# Patient Record
Sex: Male | Born: 1945
Health system: Southern US, Community
[De-identification: ages and names within clinical notes are randomized; demographics above are authoritative.]

## PROBLEM LIST (undated history)

## (undated) DIAGNOSIS — C61 Malignant neoplasm of prostate: Secondary | ICD-10-CM

## (undated) DIAGNOSIS — D649 Anemia, unspecified: Secondary | ICD-10-CM

## (undated) DIAGNOSIS — M4800 Spinal stenosis, site unspecified: Secondary | ICD-10-CM

## (undated) DIAGNOSIS — E785 Hyperlipidemia, unspecified: Secondary | ICD-10-CM

## (undated) DIAGNOSIS — H269 Unspecified cataract: Secondary | ICD-10-CM

## (undated) DIAGNOSIS — M1990 Unspecified osteoarthritis, unspecified site: Secondary | ICD-10-CM

## (undated) DIAGNOSIS — I10 Essential (primary) hypertension: Secondary | ICD-10-CM

## (undated) DIAGNOSIS — T7840XA Allergy, unspecified, initial encounter: Secondary | ICD-10-CM

## (undated) DIAGNOSIS — Z9289 Personal history of other medical treatment: Secondary | ICD-10-CM

## (undated) DIAGNOSIS — G4730 Sleep apnea, unspecified: Secondary | ICD-10-CM

## (undated) DIAGNOSIS — R7303 Prediabetes: Secondary | ICD-10-CM

## (undated) DIAGNOSIS — H9190 Unspecified hearing loss, unspecified ear: Secondary | ICD-10-CM

## (undated) HISTORY — DX: Unspecified cataract: H26.9

## (undated) HISTORY — DX: Prediabetes: R73.03

## (undated) HISTORY — DX: Hyperlipidemia, unspecified: E78.5

## (undated) HISTORY — PX: POLYPECTOMY: SHX149

## (undated) HISTORY — DX: Personal history of other medical treatment: Z92.89

## (undated) HISTORY — DX: Allergy, unspecified, initial encounter: T78.40XA

## (undated) HISTORY — PX: TONSILLECTOMY: SUR1361

## (undated) HISTORY — DX: Essential (primary) hypertension: I10

## (undated) HISTORY — DX: Sleep apnea, unspecified: G47.30

## (undated) HISTORY — PX: VASECTOMY: SHX75

## (undated) HISTORY — DX: Unspecified osteoarthritis, unspecified site: M19.90

## (undated) HISTORY — DX: Unspecified hearing loss, unspecified ear: H91.90

## (undated) HISTORY — DX: Malignant neoplasm of prostate: C61

## (undated) HISTORY — DX: Spinal stenosis, site unspecified: M48.00

## (undated) HISTORY — DX: Anemia, unspecified: D64.9

---

## 1994-11-06 DIAGNOSIS — Z9289 Personal history of other medical treatment: Secondary | ICD-10-CM

## 1994-11-06 DIAGNOSIS — D649 Anemia, unspecified: Secondary | ICD-10-CM

## 1994-11-06 HISTORY — DX: Personal history of other medical treatment: Z92.89

## 1994-11-06 HISTORY — DX: Anemia, unspecified: D64.9

## 1998-05-31 ENCOUNTER — Inpatient Hospital Stay (HOSPITAL_COMMUNITY): Admission: EM | Admit: 1998-05-31 | Discharge: 1998-06-01 | Payer: Self-pay | Admitting: Emergency Medicine

## 2000-05-08 ENCOUNTER — Ambulatory Visit (HOSPITAL_BASED_OUTPATIENT_CLINIC_OR_DEPARTMENT_OTHER): Admission: RE | Admit: 2000-05-08 | Discharge: 2000-05-08 | Payer: Self-pay | Admitting: General Surgery

## 2004-01-23 ENCOUNTER — Ambulatory Visit (HOSPITAL_BASED_OUTPATIENT_CLINIC_OR_DEPARTMENT_OTHER): Admission: RE | Admit: 2004-01-23 | Discharge: 2004-01-23 | Payer: Self-pay | Admitting: Internal Medicine

## 2004-12-22 ENCOUNTER — Ambulatory Visit: Payer: Self-pay | Admitting: Internal Medicine

## 2004-12-29 ENCOUNTER — Ambulatory Visit: Payer: Self-pay | Admitting: Internal Medicine

## 2005-04-13 ENCOUNTER — Ambulatory Visit: Payer: Self-pay | Admitting: Internal Medicine

## 2005-06-22 ENCOUNTER — Ambulatory Visit: Payer: Self-pay | Admitting: Internal Medicine

## 2005-12-28 ENCOUNTER — Ambulatory Visit: Payer: Self-pay | Admitting: Internal Medicine

## 2006-01-04 ENCOUNTER — Ambulatory Visit: Payer: Self-pay | Admitting: Internal Medicine

## 2006-07-06 ENCOUNTER — Ambulatory Visit: Payer: Self-pay | Admitting: Internal Medicine

## 2006-07-13 ENCOUNTER — Ambulatory Visit: Payer: Self-pay | Admitting: Internal Medicine

## 2007-01-31 ENCOUNTER — Ambulatory Visit: Payer: Self-pay | Admitting: Internal Medicine

## 2007-01-31 LAB — CONVERTED CEMR LAB
AST: 34 units/L (ref 0–37)
Bilirubin, Direct: 0.1 mg/dL (ref 0.0–0.3)
Cholesterol: 156 mg/dL (ref 0–200)
Eosinophils Absolute: 0.3 10*3/uL (ref 0.0–0.6)
Eosinophils Relative: 4.6 % (ref 0.0–5.0)
GFR calc Af Amer: 126 mL/min
GFR calc non Af Amer: 104 mL/min
Glucose, Bld: 113 mg/dL — ABNORMAL HIGH (ref 70–99)
HCT: 44.9 % (ref 39.0–52.0)
Hgb A1c MFr Bld: 6.3 % — ABNORMAL HIGH (ref 4.6–6.0)
Lymphocytes Relative: 34.7 % (ref 12.0–46.0)
MCV: 91.7 fL (ref 78.0–100.0)
Monocytes Absolute: 0.7 10*3/uL (ref 0.2–0.7)
Neutro Abs: 2.6 10*3/uL (ref 1.4–7.7)
Neutrophils Relative %: 47.6 % (ref 43.0–77.0)
Potassium: 4.1 meq/L (ref 3.5–5.1)
Sodium: 145 meq/L (ref 135–145)
TSH: 1.46 microintl units/mL (ref 0.35–5.50)
Triglycerides: 62 mg/dL (ref 0–149)
WBC: 5.6 10*3/uL (ref 4.5–10.5)

## 2007-02-08 ENCOUNTER — Ambulatory Visit: Payer: Self-pay | Admitting: Internal Medicine

## 2007-04-05 ENCOUNTER — Ambulatory Visit: Payer: Self-pay | Admitting: Internal Medicine

## 2007-04-05 LAB — CONVERTED CEMR LAB
ALT: 22 units/L (ref 0–40)
Alkaline Phosphatase: 45 units/L (ref 39–117)
Cholesterol: 176 mg/dL (ref 0–200)
LDL Cholesterol: 104 mg/dL — ABNORMAL HIGH (ref 0–99)
Total Protein: 6.7 g/dL (ref 6.0–8.3)

## 2007-04-12 ENCOUNTER — Ambulatory Visit: Payer: Self-pay | Admitting: Internal Medicine

## 2007-08-09 ENCOUNTER — Ambulatory Visit: Payer: Self-pay | Admitting: Internal Medicine

## 2007-08-09 LAB — CONVERTED CEMR LAB
AST: 33 units/L (ref 0–37)
Albumin: 4 g/dL (ref 3.5–5.2)
Triglycerides: 36 mg/dL (ref 0–149)
VLDL: 7 mg/dL (ref 0–40)

## 2007-08-23 ENCOUNTER — Ambulatory Visit: Payer: Self-pay | Admitting: Internal Medicine

## 2007-08-23 DIAGNOSIS — E785 Hyperlipidemia, unspecified: Secondary | ICD-10-CM | POA: Insufficient documentation

## 2007-08-23 LAB — CONVERTED CEMR LAB
Cholesterol, target level: 200 mg/dL
HDL goal, serum: 40 mg/dL
LDL Goal: 160 mg/dL

## 2007-11-19 ENCOUNTER — Encounter (INDEPENDENT_AMBULATORY_CARE_PROVIDER_SITE_OTHER): Payer: Self-pay | Admitting: Family Medicine

## 2007-12-20 ENCOUNTER — Ambulatory Visit: Payer: Self-pay | Admitting: Internal Medicine

## 2007-12-20 LAB — CONVERTED CEMR LAB
ALT: 24 units/L (ref 0–53)
AST: 33 units/L (ref 0–37)
Bilirubin, Direct: 0.2 mg/dL (ref 0.0–0.3)
Cholesterol: 148 mg/dL (ref 0–200)
HDL: 65.4 mg/dL (ref >39.0)
LDL Cholesterol: 76 mg/dL (ref 0–99)
Total Bilirubin: 1.1 mg/dL (ref 0.3–1.2)
Total Protein: 6.6 g/dL (ref 6.0–8.3)

## 2007-12-27 ENCOUNTER — Ambulatory Visit: Payer: Self-pay | Admitting: Internal Medicine

## 2008-06-26 ENCOUNTER — Ambulatory Visit: Payer: Self-pay | Admitting: Internal Medicine

## 2008-06-26 LAB — CONVERTED CEMR LAB
ALT: 19 units/L (ref 0–53)
Albumin: 4 g/dL (ref 3.5–5.2)
Alkaline Phosphatase: 44 units/L (ref 39–117)
BUN: 17 mg/dL (ref 6–23)
Bilirubin, Direct: 0.1 mg/dL (ref 0.0–0.3)
CO2: 32 meq/L (ref 19–32)
Eosinophils Relative: 3.3 % (ref 0.0–5.0)
GFR calc Af Amer: 110 mL/min
Glucose, Bld: 102 mg/dL — ABNORMAL HIGH (ref 70–99)
HCT: 41.4 % (ref 39.0–52.0)
Hemoglobin: 14.3 g/dL (ref 13.0–17.0)
LDL Cholesterol: 77 mg/dL (ref 0–99)
Lymphocytes Relative: 37.4 % (ref 12.0–46.0)
Monocytes Absolute: 0.5 10*3/uL (ref 0.1–1.0)
Monocytes Relative: 9.8 % (ref 3.0–12.0)
Neutro Abs: 2.2 10*3/uL (ref 1.4–7.7)
Nitrite: NEGATIVE
Platelets: 195 10*3/uL (ref 150–400)
Potassium: 4.3 meq/L (ref 3.5–5.1)
Specific Gravity, Urine: 1.015
Total CHOL/HDL Ratio: 2.3
Total Protein: 6.8 g/dL (ref 6.0–8.3)
WBC Urine, dipstick: NEGATIVE
WBC: 4.7 10*3/uL (ref 4.5–10.5)

## 2008-07-03 ENCOUNTER — Ambulatory Visit: Payer: Self-pay | Admitting: Internal Medicine

## 2008-07-27 ENCOUNTER — Telehealth: Payer: Self-pay | Admitting: Internal Medicine

## 2008-07-28 ENCOUNTER — Ambulatory Visit: Payer: Self-pay | Admitting: Internal Medicine

## 2008-08-11 ENCOUNTER — Encounter: Payer: Self-pay | Admitting: Internal Medicine

## 2008-08-11 ENCOUNTER — Ambulatory Visit: Payer: Self-pay | Admitting: Internal Medicine

## 2008-08-11 LAB — HM COLONOSCOPY

## 2008-08-12 ENCOUNTER — Encounter: Payer: Self-pay | Admitting: Internal Medicine

## 2009-01-08 ENCOUNTER — Ambulatory Visit: Payer: Self-pay | Admitting: Internal Medicine

## 2009-01-08 LAB — CONVERTED CEMR LAB
Alkaline Phosphatase: 60 units/L (ref 39–117)
Bilirubin, Direct: 0.1 mg/dL (ref 0.0–0.3)
Cholesterol: 167 mg/dL (ref 0–200)
LDL Cholesterol: 89 mg/dL (ref 0–99)
Total Bilirubin: 1.2 mg/dL (ref 0.3–1.2)
Total CHOL/HDL Ratio: 2.3
Total Protein: 7.1 g/dL (ref 6.0–8.3)
VLDL: 7 mg/dL (ref 0–40)

## 2009-01-15 ENCOUNTER — Ambulatory Visit: Payer: Self-pay | Admitting: Internal Medicine

## 2009-02-01 ENCOUNTER — Telehealth: Payer: Self-pay | Admitting: *Deleted

## 2009-07-09 ENCOUNTER — Ambulatory Visit: Payer: Self-pay | Admitting: Internal Medicine

## 2009-07-09 LAB — CONVERTED CEMR LAB
Alkaline Phosphatase: 55 units/L (ref 39–117)
BUN: 18 mg/dL (ref 6–23)
Basophils Relative: 0.2 % (ref 0.0–3.0)
Bilirubin Urine: NEGATIVE
Bilirubin, Direct: 0.1 mg/dL (ref 0.0–0.3)
Blood in Urine, dipstick: NEGATIVE
Calcium: 9.1 mg/dL (ref 8.4–10.5)
Chloride: 105 meq/L (ref 96–112)
Cholesterol: 159 mg/dL (ref 0–200)
Creatinine, Ser: 0.8 mg/dL (ref 0.4–1.5)
Eosinophils Absolute: 0.3 10*3/uL (ref 0.0–0.7)
Eosinophils Relative: 5.9 % — ABNORMAL HIGH (ref 0.0–5.0)
HDL: 72.5 mg/dL (ref >39.00)
Ketones, urine, test strip: NEGATIVE
LDL Cholesterol: 79 mg/dL (ref 0–99)
Lymphocytes Relative: 33.7 % (ref 12.0–46.0)
MCV: 92.8 fL (ref 78.0–100.0)
Monocytes Absolute: 0.5 10*3/uL (ref 0.1–1.0)
Neutrophils Relative %: 49.5 % (ref 43.0–77.0)
Nitrite: NEGATIVE
PSA: 1.39 ng/mL (ref 0.10–4.00)
Platelets: 175 10*3/uL (ref 150.0–400.0)
Protein, U semiquant: NEGATIVE
RBC: 4.65 M/uL (ref 4.22–5.81)
Total Bilirubin: 1.4 mg/dL — ABNORMAL HIGH (ref 0.3–1.2)
Total CHOL/HDL Ratio: 2
Triglycerides: 38 mg/dL (ref 0.0–149.0)
Urobilinogen, UA: 0.2
VLDL: 7.6 mg/dL (ref 0.0–40.0)
WBC: 4.9 10*3/uL (ref 4.5–10.5)

## 2009-07-23 ENCOUNTER — Ambulatory Visit: Payer: Self-pay | Admitting: Internal Medicine

## 2009-09-17 ENCOUNTER — Telehealth: Payer: Self-pay | Admitting: *Deleted

## 2009-12-24 ENCOUNTER — Ambulatory Visit: Payer: Self-pay | Admitting: Internal Medicine

## 2009-12-24 LAB — CONVERTED CEMR LAB
ALT: 21 units/L (ref 0–53)
AST: 30 units/L (ref 0–37)
Albumin: 4 g/dL (ref 3.5–5.2)
Alkaline Phosphatase: 50 units/L (ref 39–117)
Bilirubin, Direct: 0.1 mg/dL (ref 0.0–0.3)
Cholesterol: 139 mg/dL (ref 0–200)
HDL: 64.8 mg/dL (ref >39.00)
LDL Cholesterol: 63 mg/dL (ref 0–99)
Total Bilirubin: 1 mg/dL (ref 0.3–1.2)
Total CHOL/HDL Ratio: 2
Total Protein: 7 g/dL (ref 6.0–8.3)
Triglycerides: 57 mg/dL (ref 0.0–149.0)
VLDL: 11.4 mg/dL (ref 0.0–40.0)

## 2009-12-31 ENCOUNTER — Ambulatory Visit: Payer: Self-pay | Admitting: Internal Medicine

## 2010-01-25 ENCOUNTER — Telehealth: Payer: Self-pay | Admitting: Internal Medicine

## 2010-04-12 ENCOUNTER — Encounter: Payer: Self-pay | Admitting: Internal Medicine

## 2010-07-25 ENCOUNTER — Ambulatory Visit: Payer: Self-pay | Admitting: Internal Medicine

## 2010-07-25 LAB — CONVERTED CEMR LAB
BUN: 15 mg/dL (ref 6–23)
Basophils Absolute: 0.1 10*3/uL (ref 0.0–0.1)
Basophils Relative: 1 % (ref 0.0–3.0)
Bilirubin, Direct: 0.1 mg/dL (ref 0.0–0.3)
CO2: 31 meq/L (ref 19–32)
Chloride: 103 meq/L (ref 96–112)
Cholesterol: 149 mg/dL (ref 0–200)
Creatinine, Ser: 0.8 mg/dL (ref 0.4–1.5)
Eosinophils Absolute: 0.3 10*3/uL (ref 0.0–0.7)
Glucose, Bld: 91 mg/dL (ref 70–99)
HDL: 60.5 mg/dL (ref >39.00)
MCHC: 33.9 g/dL (ref 30.0–36.0)
MCV: 92 fL (ref 78.0–100.0)
Monocytes Absolute: 0.6 10*3/uL (ref 0.1–1.0)
Neutrophils Relative %: 53.8 % (ref 43.0–77.0)
PSA: 1.68 ng/mL (ref 0.10–4.00)
Platelets: 192 10*3/uL (ref 150.0–400.0)
RBC: 4.69 M/uL (ref 4.22–5.81)
RDW: 14.6 % (ref 11.5–14.6)
Total Protein: 6.6 g/dL (ref 6.0–8.3)
Triglycerides: 57 mg/dL (ref 0.0–149.0)

## 2010-09-09 ENCOUNTER — Telehealth: Payer: Self-pay | Admitting: Internal Medicine

## 2010-12-08 NOTE — Assessment & Plan Note (Signed)
Summary: emp--will fast//ccm   Vital Signs:  Patient profile:   65 year old male Height:      72 inches Weight:      194 pounds BMI:     26.41 Temp:     98.2 degrees F oral Pulse rate:   68 / minute Resp:     14 per minute BP sitting:   122 / 80  (left arm)  Vitals Entered By: Willy Eddy, LPN (July 25, 2010 8:56 AM)  Nutrition Counseling: Patient's BMI is greater than 25 and therefore counseled on weight management options. CC: annualvisit for disease managment, Lipid Management Is Patient Diabetic? No Pain Assessment Patient in pain? no       Does patient need assistance? Functional Status Self care Ambulation Normal  Vision Screening:Left eye with correction: 20 / 20 Right eye with correction: 20 / 20       40db HL: Left  Right  Audiometry Comment: can hear whispered voice at 6 feet    CC:  annualvisit for disease managment and Lipid Management.  History of Present Illness: The pt was asked about all immunizations, health maint. services that are appropriate to their age and was given guidance on diet exercize  and weight management   Lipid Management History:      Positive NCEP/ATP III risk factors include male age 55 years old or older.  Negative NCEP/ATP III risk factors include non-diabetic, HDL cholesterol greater than 60, no family history for ischemic heart disease, non-tobacco-user status, non-hypertensive, no ASHD (atherosclerotic heart disease), no prior stroke/TIA, no peripheral vascular disease, and no history of aortic aneurysm.     Preventive Screening-Counseling & Management  Alcohol-Tobacco     Alcohol drinks/day: 1     Smoking Status: quit     Year Quit: 1987     Passive Smoke Exposure: no     Tobacco Counseling: to remain off tobacco products  Problems Prior to Update: 1)  Physical Examination  (ICD-V70.0) 2)  Family History Diabetes 1st Degree Relative  (ICD-V18.0) 3)  Hyperlipidemia  (ICD-272.4)  Current Problems  (verified): 1)  Physical Examination  (ICD-V70.0) 2)  Family History Diabetes 1st Degree Relative  (ICD-V18.0) 3)  Hyperlipidemia  (ICD-272.4)  Medications Prior to Update: 1)  Lipitor 40 Mg  Tabs (Atorvastatin Calcium) .... 1/2 Tab By Mouth Daily 2)  Niacin Flush Free 500 Mg  Caps (Inositol Niacinate) .... Once Daily 3)  Fish Oil 1200 Mg  Caps (Omega-3 Fatty Acids) .... Qd 4)  Mvi .... Once Daily 5)  B-12 1000 Mcg Cr-Tabs (Cyanocobalamin) .[name].. 1 Once Daily 6)  Vitamin D 1000 Unit Caps (Cholecalciferol) .[name].. 1 Once Daily 7)  Cvs Vitamin E 400 Unit Caps (Vitamin E) .[name].. 1 Two Times A Day 8)  Osteo Bi-Flex Joint Shield  Tabs (Misc Natural Products) .... 2 Once Daily 9)  Vitamin C Cr 500 Mg Cr-Caps (Ascorbic Acid) .[name].. 1 Once Daily 10)  Viagra 50 Mg Tabs (Sildenafil Citrate) .... One By Mouth Prn 11)  Promethazine Hcl 25 Mg Tabs (Promethazine Hcl) .... Take One Tab Every 6 Hours  Current Medications (verified): 1)  Lipitor 20 Mg Tabs (Atorvastatin Calcium) .[name].. 1 Once Daily 2)  Niacin Flush Free 500 Mg  Caps (Inositol Niacinate) .... Once Daily 3)  Fish Oil 1200 Mg  Caps (Omega-3 Fatty Acids) .... Qd 4)  Mvi .... Once Daily 5)  B-12 1000 Mcg Cr-Tabs (Cyanocobalamin) .[name].. 1 Once Daily 6)  Vitamin D 1000 Unit Caps (Cholecalciferol) .Marland KitchenMarland KitchenMarland Kitchen  1 Once Daily 7)  Cvs Vitamin E 400 Unit Caps (Vitamin E) .[name].. 1 Two Times A Day 8)  Osteo Bi-Flex Joint Shield  Tabs (Misc Natural Products) .... 2 Once Daily 9)  Vitamin C Cr 500 Mg Cr-Caps (Ascorbic Acid) .[name].. 1 Once Daily-During Winter 10)  Viagra 50 Mg Tabs (Sildenafil Citrate) .... One By Mouth Prn  Allergies (verified): No Known Drug Allergies  Past History:  Family History: Last updated: 08/23/2007 Family History of Prostate CA 1st degree relative <50 renal dz Family History Diabetes 1st degree relative  Social History: Last updated: 07/03/2008 Retired  layed off Married Former Smoker  Risk Factors: Alcohol Use: 1  (07/25/2010) Caffeine Use: 2 (08/23/2007) Exercise: yes (08/23/2007)  Risk Factors: Smoking Status: quit (07/25/2010) Passive Smoke Exposure: no (07/25/2010)  Past medical, surgical, family and social histories (including risk factors) reviewed, and no changes noted (except as noted below).  Past Medical History: Reviewed history from 08/23/2007 and no changes required. Hyperlipidemia  Past Surgical History: Reviewed history from 08/23/2007 and no changes required. Tonsillectomy Vasectomy  Family History: Reviewed history from 08/23/2007 and no changes required. Family History of Prostate CA 1st degree relative <50 renal dz Family History Diabetes 1st degree relative  Social History: Reviewed history from 07/03/2008 and no changes required. Retired  layed off Married Former Smoker  Review of Systems  The patient denies anorexia, fever, weight loss, weight gain, vision loss, decreased hearing, hoarseness, chest pain, syncope, dyspnea on exertion, peripheral edema, prolonged cough, headaches, hemoptysis, abdominal pain, melena, hematochezia, severe indigestion/heartburn, hematuria, incontinence, genital sores, muscle weakness, suspicious skin lesions, transient blindness, difficulty walking, depression, unusual weight change, abnormal bleeding, enlarged lymph nodes, angioedema, breast masses, and testicular masses.         Flu Vaccine Consent Questions     Do you have a history of severe allergic reactions to this vaccine? no    Any prior history of allergic reactions to egg and/or gelatin? no    Do you have a sensitivity to the preservative Thimersol? no    Do you have a past history of Guillan-Barre Syndrome? no    Do you currently have an acute febrile illness? no    Have you ever had a severe reaction to latex? no    Vaccine information given and explained to patient? yes    Are you currently pregnant? no    Lot Number:[id number]   Exp Date:05/06/2011   Site Given  Left  Deltoid IM    Physical Exam  General:  Well-developed,well-nourished,in no acute distress; alert,appropriate and cooperative throughout examination Head:  male-pattern balding.  normocephalic.   Eyes:  pupils equal and pupils round.   Ears:  R ear normal and L ear normal.   Nose:  no external deformity and no nasal discharge.   Mouth:  good dentition and pharynx pink and moist.   Neck:  No deformities, masses, or tenderness noted. Lungs:  normal respiratory effort and no wheezes.   Heart:  normal rate, regular rhythm, and no murmur.   Abdomen:  soft, non-tender, and normal bowel sounds.   Rectal:  normal sphincter tone and no masses.   Genitalia:  circumcised and no urethral discharge.   Prostate:  no gland enlargement and no nodules.   Pulses:  R and L carotid,radial,femoral,dorsalis pedis and posterior tibial pulses are full and equal bilaterally Extremities:  No clubbing, cyanosis, edema, or deformity noted with normal full range of motion of all joints.   Neurologic:  No cranial nerve  deficits noted. Station and gait are normal. Plantar reflexes are down-going bilaterally. DTRs are symmetrical throughout. Sensory, motor and coordinative functions appear intact.   Impression & Recommendations:  Problem # 1:  PHYSICAL EXAMINATION (ICD-V70.0)  The pt was asked about all immunizations, health maint. services that are appropriate to their age and was given guidance on diet exercize  and weight management  Colonoscopy: Location:  [facility].   (08/11/2008) Td Booster: Td (07/23/2009)   Flu Vax: Fluvax 3+ (07/25/2010)   Pneumovax: Pneumovax (07/03/2008) Chol: 139 (12/24/2009)   HDL: 64.80 (12/24/2009)   LDL: 63 (12/24/2009)   TG: 57.0 (12/24/2009) TSH: 1.28 (07/09/2009)   HgbA1C: 6.3 (01/31/2007)   PSA: 1.39 (07/09/2009) Next Colonoscopy due:: 08/2015 (08/11/2008)  Discussed using sunscreen, use of alcohol, drug use, self testicular exam, routine dental care, routine  eye care, routine physical exam, seat belts, multiple vitamins, osteoporosis prevention, adequate calcium intake in diet, and recommendations for immunizations.  Discussed exercise and checking cholesterol.  Discussed gun safety, safe sex, and contraception. Also recommend checking PSA.  Orders: Venipuncture (78295) TLB-Lipid Panel (80061-LIPID) TLB-BMP (Basic Metabolic Panel-BMET) (80048-METABOL) TLB-CBC Platelet - w/Differential (85025-CBCD) TLB-Hepatic/Liver Function Pnl (80076-HEPATIC) TLB-TSH (Thyroid Stimulating Hormone) (84443-TSH) TLB-PSA (Prostate Specific Antigen) (84153-PSA) UA Dipstick w/o Micro (automated)  (81003)  Problem # 2:  HYPERLIPIDEMIA (ICD-272.4) Assessment: Unchanged  His updated medication list for this problem includes:    Lipitor 20 Mg Tabs (Atorvastatin calcium) .[name]... 1 once daily  Labs Reviewed: SGOT: 30 (12/24/2009)   SGPT: 21 (12/24/2009)  Lipid Goals: Chol Goal: 200 (08/23/2007)   HDL Goal: 40 (08/23/2007)   LDL Goal: 160 (08/23/2007)   TG Goal: 150 (08/23/2007)  10 Yr Risk Heart Disease: 4 % Prior 10 Yr Risk Heart Disease: 7 % (12/31/2009)   HDL:64.80 (12/24/2009), 72.50 (07/09/2009)  LDL:63 (12/24/2009), 79 (07/09/2009)  Chol:139 (12/24/2009), 159 (07/09/2009)  Trig:57.0 (12/24/2009), 38.0 (07/09/2009)  Complete Medication List: 1)  Lipitor 20 Mg Tabs (Atorvastatin calcium) .[name].. 1 once daily 2)  Niacin Flush Free 500 Mg Caps (Inositol niacinate) .... Once daily 3)  Fish Oil 1200 Mg Caps (Omega-3 fatty acids) .... Qd 4)  Mvi  .... Once daily 5)  B-12 1000 Mcg Cr-tabs (Cyanocobalamin) .[name].. 1 once daily 6)  Vitamin D 1000 Unit Caps (Cholecalciferol) .[name].. 1 once daily 7)  Cvs Vitamin E 400 Unit Caps (Vitamin e) .[name].. 1 two times a day 8)  Osteo Bi-flex Joint Shield Tabs (Misc natural products) .... 2 once daily 9)  Vitamin C Cr 500 Mg Cr-caps (Ascorbic acid) .[name].. 1 once daily-during winter 10)  Viagra 50 Mg Tabs (Sildenafil citrate) .... One by mouth  prn  Other Orders: Flu Vaccine 64yrs + MEDICARE PATIENTS (A2130) Administration Flu vaccine - MCR (G0008)  Lipid Assessment/Plan:      Based on NCEP/ATP III, the patient's risk factor category is "0-1 risk factors".  The patient's lipid goals are as follows: Total cholesterol goal is 200; LDL cholesterol goal is 160; HDL cholesterol goal is 40; Triglyceride goal is 150.  His LDL cholesterol goal has been met.    Patient Instructions: 1)  Please schedule a follow-up appointment in 6 months. 2)  Hepatic Panel prior to visit, ICD-9:995.20 3)  Lipid Panel prior to visit, ICD-9:272.4 Prescriptions: LIPITOR 20 MG TABS (ATORVASTATIN CALCIUM) 1 once daily  #90 x 3   Entered by:   Willy Eddy, LPN   Authorized by:   Stacie Glaze MD   Signed by:   Willy Eddy,  LPN on 84/13/2440   Method used:   Print then Give to Patient   RxID:   [id number]   Appended Document: j    Clinical Lists Changes  Orders: Added new Service order of Specimen Handling (25956) - Signed Observations: Added new observation of COMMENTS: Rita Ohara  July 25, 2010 10:38 AM  (07/25/2010 9:45) Added new observation of PH URINE: 5.5  (07/25/2010 9:45) Added new observation of SPEC GR URIN: 1.015  (07/25/2010 9:45) Added new observation of APPEARANCE U: Clear  (07/25/2010 9:45) Added new observation of UA COLOR: yellow  (07/25/2010 9:45) Added new observation of WBC DIPSTK U: negative  (07/25/2010 9:45) Added new observation of NITRITE URN: negative  (07/25/2010 9:45) Added new observation of UROBILINOGEN: 0.2  (07/25/2010 9:45) Added new observation of PROTEIN, URN: negative  (07/25/2010 9:45) Added new observation of BLOOD UR DIP: negative  (07/25/2010 9:45) Added new observation of KETONES URN: negative  (07/25/2010 9:45) Added new observation of BILIRUBIN UR: negative  (07/25/2010 9:45) Added new observation of GLUCOSE, URN: negative  (07/25/2010 9:45)      Laboratory Results    Urine Tests    Routine Urinalysis   Color: yellow Appearance: Clear Glucose: negative   (Normal Range: Negative) Bilirubin: negative   (Normal Range: Negative) Ketone: negative   (Normal Range: Negative) Spec. Gravity: 1.015   (Normal Range: 1.003-1.035) Blood: negative   (Normal Range: Negative) pH: 5.5   (Normal Range: 5.0-8.0) Protein: negative   (Normal Range: Negative) Urobilinogen: 0.2   (Normal Range: 0-1) Nitrite: negative   (Normal Range: Negative) Leukocyte Esterace: negative   (Normal Range: Negative)    Comments: Rita Ohara  July 25, 2010 10:38 AM

## 2010-12-08 NOTE — Assessment & Plan Note (Signed)
Summary: 6 MONTH ROV/NJR/PT RSC/CJR   Vital Signs:  Patient profile:   65 year old male Height:      72 inches Weight:      198 pounds BMI:     26.95 Temp:     98.2 degrees F oral Pulse rate:   68 / minute Resp:     14 per minute BP sitting:   140 / 80  (left arm)  Vitals Entered By: Willy Eddy, LPN (December 31, 2009 2:01 PM) CC: roa labs, Lipid Management   CC:  roa labs and Lipid Management.  History of Present Illness: Lipids follow up  Lipid Management History:      Positive NCEP/ATP III risk factors include male age 101 years old or older.  Negative NCEP/ATP III risk factors include non-diabetic, HDL cholesterol greater than 60, no family history for ischemic heart disease, non-tobacco-user status, non-hypertensive, no ASHD (atherosclerotic heart disease), no prior stroke/TIA, no peripheral vascular disease, and no history of aortic aneurysm.     Preventive Screening-Counseling & Management  Alcohol-Tobacco     Alcohol drinks/day: 1     Smoking Status: quit     Year Quit: 1987     Passive Smoke Exposure: no  Problems Prior to Update: 1)  Physical Examination  (ICD-V70.0) 2)  Family History Diabetes 1st Degree Relative  (ICD-V18.0) 3)  Hyperlipidemia  (ICD-272.4)  Current Problems (verified): 1)  Physical Examination  (ICD-V70.0) 2)  Family History Diabetes 1st Degree Relative  (ICD-V18.0) 3)  Hyperlipidemia  (ICD-272.4)  Medications Prior to Update: 1)  Lipitor 40 Mg  Tabs (Atorvastatin Calcium) .... 1/2 Tab By Mouth Daily 2)  Niacin Flush Free 500 Mg  Caps (Inositol Niacinate) .... Once Daily 3)  Fish Oil 1200 Mg  Caps (Omega-3 Fatty Acids) .... Qd 4)  Mvi .... Once Daily 5)  B-12 1000 Mcg Cr-Tabs (Cyanocobalamin) .[name].. 1 Once Daily 6)  Vitamin D 1000 Unit Caps (Cholecalciferol) .[name].. 1 Once Daily 7)  Cvs Vitamin E 400 Unit Caps (Vitamin E) .[name].. 1 Once Daily 8)  Osteo Bi-Flex Joint Shield  Tabs (Misc Natural Products) .... 2 Once Daily 9)  Vitamin C Cr  500 Mg Cr-Caps (Ascorbic Acid) .[name].. 1 Once Daily 10)  Viagra 50 Mg Tabs (Sildenafil Citrate) .... One By Mouth Prn  Current Medications (verified): 1)  Lipitor 40 Mg  Tabs (Atorvastatin Calcium) .... 1/2 Tab By Mouth Daily 2)  Niacin Flush Free 500 Mg  Caps (Inositol Niacinate) .... Once Daily 3)  Fish Oil 1200 Mg  Caps (Omega-3 Fatty Acids) .... Qd 4)  Mvi .... Once Daily 5)  B-12 1000 Mcg Cr-Tabs (Cyanocobalamin) .[name].. 1 Once Daily 6)  Vitamin D 1000 Unit Caps (Cholecalciferol) .[name].. 1 Once Daily 7)  Cvs Vitamin E 400 Unit Caps (Vitamin E) .[name].. 1 Two Times A Day 8)  Osteo Bi-Flex Joint Shield  Tabs (Misc Natural Products) .... 2 Once Daily 9)  Vitamin C Cr 500 Mg Cr-Caps (Ascorbic Acid) .[name].. 1 Once Daily 10)  Viagra 50 Mg Tabs (Sildenafil Citrate) .... One By Mouth Prn  Allergies (verified): No Known Drug Allergies  Past History:  Family History: Last updated: 08/23/2007 Family History of Prostate CA 1st degree relative <50 renal dz Family History Diabetes 1st degree relative  Social History: Last updated: 07/03/2008 Retired  layed off Married Former Smoker  Risk Factors: Alcohol Use: 1 (12/31/2009) Caffeine Use: 2 (08/23/2007) Exercise: yes (08/23/2007)  Risk Factors: Smoking Status: quit (12/31/2009) Passive Smoke Exposure: no (12/31/2009)  Past medical, surgical, family and social histories (including risk factors) reviewed, and no changes noted (except as noted below).  Past Medical History: Reviewed history from 08/23/2007 and no changes required. Hyperlipidemia  Past Surgical History: Reviewed history from 08/23/2007 and no changes required. Tonsillectomy Vasectomy  Family History: Reviewed history from 08/23/2007 and no changes required. Family History of Prostate CA 1st degree relative <50 renal dz Family History Diabetes 1st degree relative  Social History: Reviewed history from 07/03/2008 and no changes required. Retired  layed  off Married Former Smoker  Review of Systems  The patient denies anorexia, fever, weight loss, weight gain, vision loss, decreased hearing, hoarseness, chest pain, syncope, dyspnea on exertion, peripheral edema, prolonged cough, headaches, hemoptysis, abdominal pain, melena, hematochezia, severe indigestion/heartburn, hematuria, incontinence, genital sores, muscle weakness, suspicious skin lesions, transient blindness, difficulty walking, depression, unusual weight change, abnormal bleeding, enlarged lymph nodes, angioedema, and breast masses.    Physical Exam  General:  Well-developed,well-nourished,in no acute distress; alert,appropriate and cooperative throughout examination Head:  male-pattern balding.   Eyes:  pupils equal and pupils round.   Ears:  R ear normal and L ear normal.   Mouth:  Oral mucosa and oropharynx without lesions or exudates.  Teeth in good repair. Neck:  No deformities, masses, or tenderness noted. Lungs:  normal respiratory effort and no wheezes.   Heart:  normal rate, regular rhythm, and no murmur.   Abdomen:  soft, non-tender, and normal bowel sounds.   Genitalia:  Testes bilaterally descended without nodularity, tenderness or masses. No scrotal masses or lesions. No penis lesions or urethral discharge. Prostate:  Prostate gland firm and smooth, no enlargement, nodularity, tenderness, mass, asymmetry or induration. Pulses:  R and L carotid,radial,femoral,dorsalis pedis and posterior tibial pulses are full and equal bilaterally Extremities:  No clubbing, cyanosis, edema, or deformity noted with normal full range of motion of all joints.   Neurologic:  No cranial nerve deficits noted. Station and gait are normal. Plantar reflexes are down-going bilaterally. DTRs are symmetrical throughout. Sensory, motor and coordinative functions appear intact.   Impression & Recommendations:  Problem # 1:  HYPERLIPIDEMIA (ICD-272.4) Assessment Improved  monitering the effect  of lipitor and goive new rx  His updated medication list for this problem includes:    Lipitor 40 Mg Tabs (Atorvastatin calcium) .[name]... 1/2 tab by mouth daily  Labs Reviewed: SGOT: 33 (07/09/2009)   SGPT: 23 (07/09/2009)  Lipid Goals: Chol Goal: 200 (08/23/2007)   HDL Goal: 40 (08/23/2007)   LDL Goal: 160 (08/23/2007)   TG Goal: 150 (08/23/2007)  10 Yr Risk Heart Disease: 7 % Prior 10 Yr Risk Heart Disease: 4 % (01/15/2009)   HDL:72.50 (07/09/2009), 71.3 (01/08/2009)  LDL:79 (07/09/2009), 89 (01/08/2009)  Chol:159 (07/09/2009), 167 (01/08/2009)  Trig:38.0 (07/09/2009), 35 (01/08/2009)  Orders: Prescription Created Electronically [phone])  Complete Medication List: 1)  Lipitor 40 Mg Tabs (Atorvastatin calcium) .... 1/2 tab by mouth daily 2)  Niacin Flush Free 500 Mg Caps (Inositol niacinate) .... Once daily 3)  Fish Oil 1200 Mg Caps (Omega-3 fatty acids) .... Qd 4)  Mvi  .... Once daily 5)  B-12 1000 Mcg Cr-tabs (Cyanocobalamin) .[name].. 1 once daily 6)  Vitamin D 1000 Unit Caps (Cholecalciferol) .[name].. 1 once daily 7)  Cvs Vitamin E 400 Unit Caps (Vitamin e) .[name].. 1 two times a day 8)  Osteo Bi-flex Joint Shield Tabs (Misc natural products) .... 2 once daily 9)  Vitamin C Cr 500 Mg Cr-caps (Ascorbic acid) .[name].. 1 once daily 10)  Viagra 50 Mg Tabs (Sildenafil citrate) .... One by mouth prn  Lipid Assessment/Plan:      Based on NCEP/ATP III, the patient's risk factor category is "0-1 risk factors".  The patient's lipid goals are as follows: Total cholesterol goal is 200; LDL cholesterol goal is 160; HDL cholesterol goal is 40; Triglyceride goal is 150.  His LDL cholesterol goal has been met.    Patient Instructions: 1)  Please schedule a follow-up appointment in 7  CPX Prescriptions: LIPITOR 40 MG  TABS (ATORVASTATIN CALCIUM) 1/2 tab by mouth daily  #30 x 11   Entered and Authorized by:   Stacie Glaze MD   Signed by:   Stacie Glaze MD on 12/31/2009   Method used:   Electronically to         [facility] [phone]* (retail)       [address] [place], [place]  [address]       Ph: [phone]       Fax: [phone]   RxID:   [id number]    Immunization History:  Influenza Immunization History:    Influenza:  historical (07/09/2009)

## 2010-12-08 NOTE — Progress Notes (Signed)
Summary: stomach virus  Phone Note Call from Patient Call back at Home Phone [phone]   Caller: Spouse Reason for Call: Acute Illness Summary of Call: wife is calling because patient has had NVD since this morning.  Follow-up for Phone Call        promethizine, clear liquids and imodium per dr Lovell Sheehan Follow-up by: Kern Reap CMA Duncan Dull),  January 25, 2010 3:49 PM  Additional Follow-up for Phone Call Additional follow up Details #1::        Phone Call Completed Additional Follow-up by: Kern Reap CMA Duncan Dull),  January 25, 2010 3:52 PM    New/Updated Medications: PROMETHAZINE HCL 25 MG TABS (PROMETHAZINE HCL) take one tab every 6 hours Prescriptions: PROMETHAZINE HCL 25 MG TABS (PROMETHAZINE HCL) take one tab every 6 hours  #6 x 0   Entered by:   Kern Reap CMA (AAMA)   Authorized by:   Stacie Glaze MD   Signed by:   Kern Reap CMA (AAMA) on 01/25/2010   Method used:   Electronically to        [facility] [phone]* (retail)       [address]       [place], [place]  [address]       Ph: [phone]       Fax: [phone]   RxID:   [id number]

## 2010-12-08 NOTE — Medication Information (Signed)
Summary: Order for CPAP Supplies/[facility]  Order for CPAP Supplies/[facility]   Imported By: Maryln Gottron 04/15/2010 14:14:05  _____________________________________________________________________  External Attachment:    Type:   Image     Comment:   External Document

## 2010-12-08 NOTE — Progress Notes (Signed)
Summary: Vitamin E questions  Phone Note Call from Patient Call back at Home Phone [phone]   Caller: Patient Call For: Stacie Glaze MD Summary of Call: Pt is worried about taking 400 mg. Vitamin E and he is taking 800 mg........Marland Kitchenwhat should he do? Initial call taken by: Lynann Beaver CMA AAMA,  September 09, 2010 1:31 PM  Follow-up for Phone Call        per dr Lovell Sheehan- take 400 once daily  Follow-up by: Willy Eddy, LPN,  September 12, 2010 10:58 AM  Additional Follow-up for Phone Call Additional follow up Details #1::        Left message on personal voice mail. Additional Follow-up by: Lynann Beaver CMA AAMA,  September 12, 2010 11:17 AM

## 2011-01-16 ENCOUNTER — Other Ambulatory Visit (INDEPENDENT_AMBULATORY_CARE_PROVIDER_SITE_OTHER): Payer: MEDICARE | Admitting: Internal Medicine

## 2011-01-16 DIAGNOSIS — T887XXA Unspecified adverse effect of drug or medicament, initial encounter: Secondary | ICD-10-CM

## 2011-01-16 DIAGNOSIS — E785 Hyperlipidemia, unspecified: Secondary | ICD-10-CM

## 2011-01-16 LAB — LIPID PANEL
Cholesterol: 159 mg/dL (ref 0–200)
LDL Cholesterol: 81 mg/dL (ref 0–99)
Triglycerides: 69 mg/dL (ref 0.0–149.0)
VLDL: 13.8 mg/dL (ref 0.0–40.0)

## 2011-01-16 LAB — HEPATIC FUNCTION PANEL
AST: 28 U/L (ref 0–37)
Total Bilirubin: 0.9 mg/dL (ref 0.3–1.2)

## 2011-01-26 ENCOUNTER — Encounter: Payer: Self-pay | Admitting: Internal Medicine

## 2011-01-30 ENCOUNTER — Ambulatory Visit (INDEPENDENT_AMBULATORY_CARE_PROVIDER_SITE_OTHER): Payer: MEDICARE | Admitting: Internal Medicine

## 2011-01-30 ENCOUNTER — Encounter: Payer: Self-pay | Admitting: Internal Medicine

## 2011-01-30 VITALS — BP 140/70 | HR 64 | Temp 98.0°F | Resp 14 | Ht 71.0 in | Wt 207.0 lb

## 2011-01-30 DIAGNOSIS — E785 Hyperlipidemia, unspecified: Secondary | ICD-10-CM

## 2011-01-30 DIAGNOSIS — Z0000 Encounter for general adult medical examination without abnormal findings: Secondary | ICD-10-CM

## 2011-01-30 DIAGNOSIS — N529 Male erectile dysfunction, unspecified: Secondary | ICD-10-CM | POA: Insufficient documentation

## 2011-01-30 DIAGNOSIS — M1990 Unspecified osteoarthritis, unspecified site: Secondary | ICD-10-CM | POA: Insufficient documentation

## 2011-01-30 NOTE — Assessment & Plan Note (Addendum)
Patient is taking Lipitor for his hyperlipidemia he is at all goals with a total cholesterol less than 170 and an LDL C. Less than 90. Due to his family hx I would recommend keeping the total lower than 170

## 2011-01-30 NOTE — Progress Notes (Signed)
  Subjective:    Patient ID: Martin Chase, male    DOB: 08/06/1946, 65 y.o.   MRN: [id number]  HPI  patient is a 65 year old white male who presents for followup of hyperlipidemia he is strong family risk factors including stroke and heart attack and his family as well as adult-onset diabetes he has been on Lipitor  20 mg by mouth daily. His complaint with his medications he has no side effects to report of the cost his blood pressure is stable his weight is slightly increased as he has been retired and not as active as he has been in the past he denies any chest pain shortness of breath PND orthopnea   Review of Systems  Constitutional: Negative for fever and fatigue.  HENT: Negative for hearing loss, congestion, neck pain and postnasal drip.   Eyes: Negative for discharge, redness and visual disturbance.  Respiratory: Negative for cough, shortness of breath and wheezing.   Cardiovascular: Negative for leg swelling.  Gastrointestinal: Negative for abdominal pain, constipation and abdominal distention.  Genitourinary: Negative for urgency and frequency.  Musculoskeletal: Negative for joint swelling and arthralgias.  Skin: Negative for color change and rash.  Neurological: Negative for weakness and light-headedness.  Hematological: Negative for adenopathy.  Psychiatric/Behavioral: Negative for behavioral problems.   Past Medical History  Diagnosis Date  . Hyperlipidemia    Past Surgical History  Procedure Date  . Vasectomy   . Tonsillectomy     reports that he quit smoking about 25 years ago. He does not have any smokeless tobacco history on file. He reports that he drinks alcohol. He reports that he uses illicit drugs. family history includes Alzheimer's disease in his mother; Cancer in his father; Diabetes in his maternal grandmother, maternal uncle, and mother; Heart disease in his maternal grandfather; and Stroke in his paternal uncle. Not on File     Objective:   Physical  Exam  Constitutional: He is oriented to person, place, and time. He appears well-developed and well-nourished.  HENT:  Head: Normocephalic and atraumatic.  Eyes: Conjunctivae are normal. Pupils are equal, round, and reactive to light.  Neck: Normal range of motion. Neck supple.  Cardiovascular: Normal rate and regular rhythm.   Pulmonary/Chest: Effort normal and breath sounds normal.  Abdominal: Soft. Bowel sounds are normal.  Musculoskeletal: Normal range of motion.  Neurological: He is alert and oriented to person, place, and time.          Assessment & Plan:   the patient is stabilize medications retractors and good control we urged weight loss and exercise he presented 6 months for physical

## 2011-01-30 NOTE — Assessment & Plan Note (Signed)
Refill, ED may be a sign of PAD so increased emphasis on keeping his cholesterol low

## 2011-07-21 ENCOUNTER — Other Ambulatory Visit: Payer: MEDICARE

## 2011-07-28 ENCOUNTER — Encounter: Payer: MEDICARE | Admitting: Internal Medicine

## 2011-08-04 ENCOUNTER — Encounter: Payer: MEDICARE | Admitting: Internal Medicine

## 2011-09-01 ENCOUNTER — Other Ambulatory Visit: Payer: Self-pay | Admitting: Internal Medicine

## 2011-09-01 ENCOUNTER — Other Ambulatory Visit (INDEPENDENT_AMBULATORY_CARE_PROVIDER_SITE_OTHER): Payer: Medicare Other

## 2011-09-01 DIAGNOSIS — Z0000 Encounter for general adult medical examination without abnormal findings: Secondary | ICD-10-CM

## 2011-09-01 DIAGNOSIS — Z125 Encounter for screening for malignant neoplasm of prostate: Secondary | ICD-10-CM

## 2011-09-01 DIAGNOSIS — Z79899 Other long term (current) drug therapy: Secondary | ICD-10-CM

## 2011-09-01 DIAGNOSIS — G4730 Sleep apnea, unspecified: Secondary | ICD-10-CM

## 2011-09-01 LAB — CBC WITH DIFFERENTIAL/PLATELET
Basophils Relative: 0.3 % (ref 0.0–3.0)
Eosinophils Relative: 4.9 % (ref 0.0–5.0)
HCT: 43 % (ref 39.0–52.0)
Hemoglobin: 14.5 g/dL (ref 13.0–17.0)
Lymphs Abs: 1.8 10*3/uL (ref 0.7–4.0)
MCV: 92.7 fl (ref 78.0–100.0)
Monocytes Absolute: 0.5 10*3/uL (ref 0.1–1.0)
Neutro Abs: 2 10*3/uL (ref 1.4–7.7)
Neutrophils Relative %: 44 % (ref 43.0–77.0)
RBC: 4.64 Mil/uL (ref 4.22–5.81)
WBC: 4.5 10*3/uL (ref 4.5–10.5)

## 2011-09-01 LAB — HEPATIC FUNCTION PANEL
ALT: 22 U/L (ref 0–53)
Bilirubin, Direct: 0.1 mg/dL (ref 0.0–0.3)
Total Protein: 6.5 g/dL (ref 6.0–8.3)

## 2011-09-01 LAB — LIPID PANEL
HDL: 57 mg/dL (ref >39.00)
Total CHOL/HDL Ratio: 3
Triglycerides: 42 mg/dL (ref 0.0–149.0)
VLDL: 8.4 mg/dL (ref 0.0–40.0)

## 2011-09-01 LAB — BASIC METABOLIC PANEL
Chloride: 105 mEq/L (ref 96–112)
Potassium: 5 mEq/L (ref 3.5–5.1)
Sodium: 142 mEq/L (ref 135–145)

## 2011-09-01 LAB — TSH: TSH: 1.49 u[IU]/mL (ref 0.35–5.50)

## 2011-09-01 LAB — POCT URINALYSIS DIPSTICK
Blood, UA: NEGATIVE
Glucose, UA: NEGATIVE
Ketones, UA: NEGATIVE
Protein, UA: NEGATIVE
Spec Grav, UA: 1.02

## 2011-09-22 ENCOUNTER — Ambulatory Visit (INDEPENDENT_AMBULATORY_CARE_PROVIDER_SITE_OTHER): Payer: Medicare Other | Admitting: Internal Medicine

## 2011-09-22 ENCOUNTER — Encounter: Payer: Self-pay | Admitting: Internal Medicine

## 2011-09-22 DIAGNOSIS — Z0000 Encounter for general adult medical examination without abnormal findings: Secondary | ICD-10-CM

## 2011-09-22 DIAGNOSIS — Z23 Encounter for immunization: Secondary | ICD-10-CM

## 2011-09-22 DIAGNOSIS — G4730 Sleep apnea, unspecified: Secondary | ICD-10-CM

## 2011-09-22 NOTE — Progress Notes (Signed)
Subjective:    Patient ID: Martin Chase, male    DOB: [birth year], 65 y.o.   MRN: [id number]  HPI  cpx  The patient has a history of CPAP use his current unit is over 49 years of age she had sleep apnea documented at that time there are no changes or conditions he needs to continue using the CPAP machine and a new CPAP will be ordered the CPAP would definitely be beneficial given his diagnosis of sleep apnea  Review of Systems  Constitutional: Negative for fever and fatigue.  HENT: Negative for hearing loss, congestion, neck pain and postnasal drip.   Eyes: Negative for discharge, redness and visual disturbance.  Respiratory: Negative for cough, shortness of breath and wheezing.   Cardiovascular: Negative for leg swelling.  Gastrointestinal: Negative for abdominal pain, constipation and abdominal distention.  Genitourinary: Negative for urgency and frequency.  Musculoskeletal: Negative for joint swelling and arthralgias.  Skin: Negative for color change and rash.  Neurological: Negative for weakness and light-headedness.  Hematological: Negative for adenopathy.  Psychiatric/Behavioral: Negative for behavioral problems.   Past Medical History  Diagnosis Date  . Hyperlipidemia     History   Social History  . Marital Status: Married    Spouse Name: N/A    Number of Children: N/A  . Years of Education: N/A   Occupational History  . Not on file.   Social History Main Topics  . Smoking status: Former Smoker    Quit date: 01/29/1986  . Smokeless tobacco: Not on file  . Alcohol Use: Yes  . Drug Use: Yes  . Sexually Active: Yes   Other Topics Concern  . Not on file   Social History Narrative  . No narrative on file    Past Surgical History  Procedure Date  . Vasectomy   . Tonsillectomy     Family History  Problem Relation Age of Onset  . Diabetes Mother   . Alzheimer's disease Mother   . Cancer Father     prostate  . Diabetes Maternal Uncle   . Stroke  Paternal Uncle   . Diabetes Maternal Grandmother   . Heart disease Maternal Grandfather     Not on File  Current Outpatient Prescriptions on File Prior to Visit  Medication Sig Dispense Refill  . atorvastatin (LIPITOR) 20 MG tablet TAKE 1 TABLET BY MOUTH DAILY  90 tablet  3  . Cholecalciferol (VITAMIN D) 1000 UNITS capsule Take 1,000 Units by mouth daily.        . Misc Natural Products (OSTEO BI-FLEX ADV JOINT SHIELD PO) Take by mouth 2 (two) times daily.        . Multiple Vitamin (MULTIVITAMIN) tablet Take 1 tablet by mouth daily.        . niacin 500 MG tablet Take 500 mg by mouth daily with breakfast.        . Omega-3 Fatty Acids (FISH OIL) 1200 MG CAPS Take 1,200 mg by mouth daily.        . sildenafil (VIAGRA) 50 MG tablet Take 50 mg by mouth daily as needed.        . vitamin E 400 UNIT capsule Take 400 Units by mouth 2 (two) times daily.          BP 120/74  Pulse 52  Temp 98.2 F (36.8 C)  Resp 16  Ht 5\' 11"  (1.803 m)  Wt 206 lb (93.441 kg)  BMI 28.73 kg/m2        Objective:  Physical Exam  Nursing note and vitals reviewed. Constitutional: He is oriented to person, place, and time. He appears well-developed and well-nourished.  HENT:  Head: Normocephalic and atraumatic.  Eyes: Conjunctivae are normal. Pupils are equal, round, and reactive to light.  Neck: Normal range of motion. Neck supple.  Cardiovascular: Normal rate and regular rhythm.   Pulmonary/Chest: Effort normal and breath sounds normal.  Abdominal: Soft. Bowel sounds are normal.  Musculoskeletal: Normal range of motion.  Neurological: He is alert and oriented to person, place, and time.  Skin: Skin is warm and dry.          Assessment & Plan:   Patient presents for yearly preventative medicine examination.   all immunizations and health maintenance protocols were reviewed with the patient and they are up to date with these protocols.   screening laboratory values were reviewed with the patient  including screening of hyperlipidemia PSA renal function and hepatic function.   There medications past medical history social history problem list and allergies were reviewed in detail.   Goals were established with regard to weight loss exercise diet in compliance with medications   The patient's CPAP and has aged out and is now working appropriately and he needs a new CPAP in order will be given for new CPAP

## 2011-09-22 NOTE — Patient Instructions (Signed)
The patient is instructed to continue all medications as prescribed. Schedule followup with check out clerk upon leaving the clinic  

## 2011-11-21 ENCOUNTER — Telehealth: Payer: Self-pay | Admitting: *Deleted

## 2011-11-21 ENCOUNTER — Other Ambulatory Visit: Payer: Self-pay | Admitting: *Deleted

## 2011-11-21 DIAGNOSIS — G4730 Sleep apnea, unspecified: Secondary | ICD-10-CM

## 2011-11-21 NOTE — Telephone Encounter (Signed)
[facility] called with order for zostavax- new c pap order sent to [facility]0 pt informed

## 2011-11-21 NOTE — Telephone Encounter (Signed)
Pt and his wife would like Shingles vaccine prescription called to [facility] in [place].  He also needs a new CPap machine and wants to know if he needs to be tested again.  Has been 10 years. [facility] in [place] [facility] can do an in home test if needed.  Patsy Muellner is the wife.

## 2012-03-21 ENCOUNTER — Other Ambulatory Visit (INDEPENDENT_AMBULATORY_CARE_PROVIDER_SITE_OTHER): Payer: Medicare Other

## 2012-03-21 DIAGNOSIS — Z0000 Encounter for general adult medical examination without abnormal findings: Secondary | ICD-10-CM

## 2012-03-21 DIAGNOSIS — E785 Hyperlipidemia, unspecified: Secondary | ICD-10-CM

## 2012-03-21 LAB — HEPATIC FUNCTION PANEL
ALT: 18 U/L (ref 0–53)
AST: 30 U/L (ref 0–37)
Bilirubin, Direct: 0.1 mg/dL (ref 0.0–0.3)
Total Bilirubin: 0.9 mg/dL (ref 0.3–1.2)
Total Protein: 7.2 g/dL (ref 6.0–8.3)

## 2012-03-21 LAB — LIPID PANEL
LDL Cholesterol: 75 mg/dL (ref 0–99)
Total CHOL/HDL Ratio: 2

## 2012-03-28 ENCOUNTER — Ambulatory Visit: Payer: Medicare Other | Admitting: Internal Medicine

## 2012-03-29 ENCOUNTER — Encounter: Payer: Self-pay | Admitting: Internal Medicine

## 2012-03-29 ENCOUNTER — Ambulatory Visit (INDEPENDENT_AMBULATORY_CARE_PROVIDER_SITE_OTHER): Payer: Medicare Other | Admitting: Internal Medicine

## 2012-03-29 VITALS — BP 138/72 | HR 60 | Temp 98.0°F | Resp 16 | Ht 71.0 in | Wt 200.0 lb

## 2012-03-29 DIAGNOSIS — T887XXA Unspecified adverse effect of drug or medicament, initial encounter: Secondary | ICD-10-CM

## 2012-03-29 DIAGNOSIS — E785 Hyperlipidemia, unspecified: Secondary | ICD-10-CM

## 2012-03-29 NOTE — Patient Instructions (Signed)
The patient is instructed to continue all medications as prescribed. Schedule followup with check out clerk upon leaving the clinic  

## 2012-03-29 NOTE — Progress Notes (Signed)
Subjective:    Patient ID: Martin Chase, male    DOB: 04/29/1946, 66 y.o.   MRN: [id number]  HPI Patient is a 66 year old white male presents for followup of hyperlipidemia specifically a liver and lipid were drawn prior to this visit to be reviewed.  His liver functions are completely normal his cholesterol was at call.  Of note there was an increase in his HDL he states however that he has been intolerant of the niacin with flushing episodes and reviewed recently her sugar with him that niacin may not be as effective in changing cardiovascular risks as we have once hoped.  His weight is stable he is exercising on a regular basis he is tolerating the rest of his medications including his Lipitor   Review of Systems  Constitutional: Negative for fever and fatigue.  HENT: Negative for hearing loss, congestion, neck pain and postnasal drip.   Eyes: Negative for discharge, redness and visual disturbance.  Respiratory: Negative for cough, shortness of breath and wheezing.   Cardiovascular: Negative for leg swelling.  Gastrointestinal: Negative for abdominal pain, constipation and abdominal distention.  Genitourinary: Negative for urgency and frequency.  Musculoskeletal: Negative for joint swelling and arthralgias.  Skin: Negative for color change and rash.  Neurological: Negative for weakness and light-headedness.  Hematological: Negative for adenopathy.  Psychiatric/Behavioral: Negative for behavioral problems.   Past Medical History  Diagnosis Date  . Hyperlipidemia     History   Social History  . Marital Status: Married    Spouse Name: N/A    Number of Children: N/A  . Years of Education: N/A   Occupational History  . Not on file.   Social History Main Topics  . Smoking status: Former Smoker    Quit date: 01/29/1986  . Smokeless tobacco: Not on file  . Alcohol Use: Yes  . Drug Use: Yes  . Sexually Active: Yes   Other Topics Concern  . Not on file   Social  History Narrative  . No narrative on file    Past Surgical History  Procedure Date  . Vasectomy   . Tonsillectomy     Family History  Problem Relation Age of Onset  . Diabetes Mother   . Alzheimer's disease Mother   . Cancer Father     prostate  . Diabetes Maternal Uncle   . Stroke Paternal Uncle   . Diabetes Maternal Grandmother   . Heart disease Maternal Grandfather     No Known Allergies  Current Outpatient Prescriptions on File Prior to Visit  Medication Sig Dispense Refill  . atorvastatin (LIPITOR) 20 MG tablet TAKE 1 TABLET BY MOUTH DAILY  90 tablet  3  . Cholecalciferol (VITAMIN D) 1000 UNITS capsule Take 1,000 Units by mouth daily.        . Misc Natural Products (OSTEO BI-FLEX ADV JOINT SHIELD PO) Take by mouth 2 (two) times daily.        . Multiple Vitamin (MULTIVITAMIN) tablet Take 1 tablet by mouth daily.        . Omega-3 Fatty Acids (FISH OIL) 1200 MG CAPS Take 1,200 mg by mouth daily.        . sildenafil (VIAGRA) 50 MG tablet Take 50 mg by mouth daily as needed.        . vitamin E 400 UNIT capsule Take 400 Units by mouth 2 (two) times daily.          BP 138/72  Pulse 60  Temp 98 F (36.7 C)  Resp 16  Ht 5\' 11"  (1.803 m)  Wt 200 lb (90.719 kg)  BMI 27.89 kg/m2       Objective:   Physical Exam  Constitutional: He appears well-developed and well-nourished.  HENT:  Head: Normocephalic and atraumatic.  Eyes: Conjunctivae are normal. Pupils are equal, round, and reactive to light.  Neck: Normal range of motion. Neck supple.  Cardiovascular: Normal rate and regular rhythm.   Pulmonary/Chest: Effort normal and breath sounds normal.  Abdominal: Soft. Bowel sounds are normal.          Assessment & Plan:  Continue all medications for his hyperlipidemia including the fish oil and the Lipitor discontinue the niacin after reviewing recent literature about its potential effects I believe that his total cholesterol as well as HDL and LDL ratios are  acceptable at this point to discontinue the niacin.  He is also having mild to moderate side effects from the niacin his blood pressure stable his weight is stable he continues to exercise on a regular basis he'll followup with a lipid panel temp is physical in 6 months time

## 2012-04-04 ENCOUNTER — Encounter: Payer: Self-pay | Admitting: Internal Medicine

## 2012-08-09 ENCOUNTER — Other Ambulatory Visit: Payer: Self-pay | Admitting: Internal Medicine

## 2012-09-16 ENCOUNTER — Other Ambulatory Visit (INDEPENDENT_AMBULATORY_CARE_PROVIDER_SITE_OTHER): Payer: Medicare Other

## 2012-09-16 DIAGNOSIS — E785 Hyperlipidemia, unspecified: Secondary | ICD-10-CM

## 2012-09-16 DIAGNOSIS — Z79899 Other long term (current) drug therapy: Secondary | ICD-10-CM

## 2012-09-16 DIAGNOSIS — Z0000 Encounter for general adult medical examination without abnormal findings: Secondary | ICD-10-CM

## 2012-09-16 DIAGNOSIS — Z125 Encounter for screening for malignant neoplasm of prostate: Secondary | ICD-10-CM

## 2012-09-16 LAB — PSA: PSA: 1.62 ng/mL (ref 0.10–4.00)

## 2012-09-16 LAB — POCT URINALYSIS DIPSTICK
Bilirubin, UA: NEGATIVE
Glucose, UA: NEGATIVE
Leukocytes, UA: NEGATIVE
Nitrite, UA: NEGATIVE

## 2012-09-16 LAB — CBC WITH DIFFERENTIAL/PLATELET
Basophils Absolute: 0.1 10*3/uL (ref 0.0–0.1)
Eosinophils Absolute: 0.2 10*3/uL (ref 0.0–0.7)
Hemoglobin: 14.4 g/dL (ref 13.0–17.0)
Lymphocytes Relative: 33.8 % (ref 12.0–46.0)
Lymphs Abs: 1.9 10*3/uL (ref 0.7–4.0)
MCHC: 32.8 g/dL (ref 30.0–36.0)
Monocytes Absolute: 0.5 10*3/uL (ref 0.1–1.0)
Neutro Abs: 3 10*3/uL (ref 1.4–7.7)
RDW: 14.6 % (ref 11.5–14.6)

## 2012-09-16 LAB — BASIC METABOLIC PANEL
BUN: 17 mg/dL (ref 6–23)
Chloride: 106 mEq/L (ref 96–112)
Creatinine, Ser: 0.9 mg/dL (ref 0.4–1.5)

## 2012-09-16 LAB — LIPID PANEL
HDL: 65.5 mg/dL (ref >39.00)
Triglycerides: 45 mg/dL (ref 0.0–149.0)

## 2012-09-16 LAB — HEPATIC FUNCTION PANEL: Albumin: 3.9 g/dL (ref 3.5–5.2)

## 2012-09-23 ENCOUNTER — Ambulatory Visit (INDEPENDENT_AMBULATORY_CARE_PROVIDER_SITE_OTHER): Payer: Medicare Other | Admitting: Internal Medicine

## 2012-09-23 ENCOUNTER — Encounter: Payer: Self-pay | Admitting: Internal Medicine

## 2012-09-23 VITALS — BP 140/84 | HR 60 | Temp 98.2°F | Resp 16 | Ht 71.0 in | Wt 200.0 lb

## 2012-09-23 DIAGNOSIS — E785 Hyperlipidemia, unspecified: Secondary | ICD-10-CM

## 2012-09-23 DIAGNOSIS — Z0000 Encounter for general adult medical examination without abnormal findings: Secondary | ICD-10-CM

## 2012-09-23 NOTE — Progress Notes (Signed)
Subjective:    Patient ID: Martin Chase, male    DOB: [birth year], 66 y.o.   MRN: [id number] CPX HPI  Patient is a 66 year old male with a history of bradycardia hyperlipidemia and osteoarthritis he is currently on Lipitor  Review of Systems  Constitutional: Negative for fever and fatigue.  HENT: Negative for hearing loss, congestion, neck pain and postnasal drip.   Eyes: Negative for discharge, redness and visual disturbance.  Respiratory: Negative for cough, shortness of breath and wheezing.   Cardiovascular: Negative for leg swelling.  Gastrointestinal: Negative for abdominal pain, constipation and abdominal distention.  Genitourinary: Negative for urgency and frequency.  Musculoskeletal: Negative for joint swelling and arthralgias.  Skin: Negative for color change and rash.  Neurological: Negative for weakness and light-headedness.  Hematological: Negative for adenopathy.  Psychiatric/Behavioral: Negative for behavioral problems.   Past Medical History  Diagnosis Date  . Hyperlipidemia     History   Social History  . Marital Status: Married    Spouse Name: N/A    Number of Children: N/A  . Years of Education: N/A   Occupational History  . Not on file.   Social History Main Topics  . Smoking status: Former Smoker    Quit date: 01/29/1986  . Smokeless tobacco: Not on file  . Alcohol Use: Yes  . Drug Use: Yes  . Sexually Active: Yes   Other Topics Concern  . Not on file   Social History Narrative  . No narrative on file    Past Surgical History  Procedure Date  . Vasectomy   . Tonsillectomy     Family History  Problem Relation Age of Onset  . Diabetes Mother   . Alzheimer's disease Mother   . Cancer Father     prostate  . Diabetes Maternal Uncle   . Stroke Paternal Uncle   . Diabetes Maternal Grandmother   . Heart disease Maternal Grandfather     No Known Allergies  Current Outpatient Prescriptions on File Prior to Visit  Medication Sig  Dispense Refill  . atorvastatin (LIPITOR) 20 MG tablet TAKE 1 TABLET BY MOUTH DAILY  90 tablet  3  . Cholecalciferol (VITAMIN D) 1000 UNITS capsule Take 1,000 Units by mouth daily.        . Misc Natural Products (OSTEO BI-FLEX ADV JOINT SHIELD PO) Take by mouth 2 (two) times daily.        . Multiple Vitamin (MULTIVITAMIN) tablet Take 1 tablet by mouth daily.        . Omega-3 Fatty Acids (FISH OIL) 1200 MG CAPS Take 1,200 mg by mouth daily.        . sildenafil (VIAGRA) 50 MG tablet Take 50 mg by mouth daily as needed.        . vitamin E 400 UNIT capsule Take 400 Units by mouth 2 (two) times daily.          BP 140/84  Pulse 60  Temp 98.2 F (36.8 C)  Resp 16  Ht 5\' 11"  (1.803 m)  Wt 200 lb (90.719 kg)  BMI 27.89 kg/m2       Objective:   Physical Exam  Nursing note and vitals reviewed. Constitutional: He is oriented to person, place, and time. He appears well-developed and well-nourished.  HENT:  Head: Normocephalic and atraumatic.  Eyes: Conjunctivae normal are normal. Pupils are equal, round, and reactive to light.  Neck: Normal range of motion. Neck supple.  Cardiovascular: Normal rate and regular rhythm.   Murmur heard.  Sinus bradycardia  Pulmonary/Chest: Effort normal and breath sounds normal.  Abdominal: Soft. Bowel sounds are normal.  Genitourinary: Rectum normal and prostate normal.  Musculoskeletal: Normal range of motion.  Neurological: He is alert and oriented to person, place, and time.  Skin: Skin is warm and dry.          Assessment & Plan:   Patient presents for yearly preventative medicine examination.   all immunizations and health maintenance protocols were reviewed with the patient and they are up to date with these protocols.   screening laboratory values were reviewed with the patient including screening of hyperlipidemia PSA renal function and hepatic function.   There medications past medical history social history problem list and  allergies were reviewed in detail.   Goals were established with regard to weight loss exercise diet in compliance with medications

## 2012-09-23 NOTE — Patient Instructions (Signed)
The patient is instructed to continue all medications as prescribed. Schedule followup with check out clerk upon leaving the clinic  

## 2012-10-18 ENCOUNTER — Telehealth: Payer: Self-pay | Admitting: Internal Medicine

## 2012-10-18 MED ORDER — ATORVASTATIN CALCIUM 20 MG PO TABS: 20.0000 mg | ORAL_TABLET | Freq: Every day | ORAL | Status: DC

## 2012-10-18 NOTE — Telephone Encounter (Signed)
Pt would like to start using [facility]. Requesting that we send his atorvastatin (LIPITOR) 20 MG tablet there for a 90-day rx - instead of [facility]. Thanks.

## 2013-01-13 ENCOUNTER — Other Ambulatory Visit: Payer: Medicare Other

## 2013-01-20 ENCOUNTER — Ambulatory Visit: Payer: Medicare Other | Admitting: Internal Medicine

## 2013-03-17 ENCOUNTER — Other Ambulatory Visit (INDEPENDENT_AMBULATORY_CARE_PROVIDER_SITE_OTHER): Payer: Medicare Other

## 2013-03-17 DIAGNOSIS — E785 Hyperlipidemia, unspecified: Secondary | ICD-10-CM

## 2013-03-17 LAB — HEPATIC FUNCTION PANEL
ALT: 19 U/L (ref 0–53)
AST: 29 U/L (ref 0–37)
Albumin: 3.9 g/dL (ref 3.5–5.2)
Alkaline Phosphatase: 52 U/L (ref 39–117)

## 2013-03-17 LAB — LIPID PANEL
HDL: 59.9 mg/dL (ref >39.00)
LDL Cholesterol: 56 mg/dL (ref 0–99)
Total CHOL/HDL Ratio: 2
VLDL: 11.6 mg/dL (ref 0.0–40.0)

## 2013-03-24 ENCOUNTER — Ambulatory Visit (INDEPENDENT_AMBULATORY_CARE_PROVIDER_SITE_OTHER): Payer: Medicare Other | Admitting: Internal Medicine

## 2013-03-24 ENCOUNTER — Encounter: Payer: Self-pay | Admitting: Internal Medicine

## 2013-03-24 VITALS — BP 140/70 | HR 72 | Temp 98.2°F | Resp 16 | Ht 71.0 in | Wt 198.0 lb

## 2013-03-24 DIAGNOSIS — M1990 Unspecified osteoarthritis, unspecified site: Secondary | ICD-10-CM

## 2013-03-24 DIAGNOSIS — E785 Hyperlipidemia, unspecified: Secondary | ICD-10-CM

## 2013-03-24 NOTE — Progress Notes (Signed)
Subjective:    Patient ID: Martin Chase, male    DOB: August 17, 1946, 67 y.o.   MRN: [id number]  HPI  Reviewed the lipid profile   Review of Systems  Constitutional: Negative for fever and fatigue.  HENT: Negative for hearing loss, congestion, neck pain and postnasal drip.   Eyes: Negative for discharge, redness and visual disturbance.  Respiratory: Negative for cough, shortness of breath and wheezing.   Cardiovascular: Negative for leg swelling.  Gastrointestinal: Negative for abdominal pain, constipation and abdominal distention.  Genitourinary: Negative for urgency and frequency.  Musculoskeletal: Negative for joint swelling and arthralgias.  Skin: Negative for color change and rash.  Neurological: Negative for weakness and light-headedness.  Hematological: Negative for adenopathy.  Psychiatric/Behavioral: Negative for behavioral problems.   Past Medical History  Diagnosis Date  . Hyperlipidemia     History   Social History  . Marital Status: Married    Spouse Name: N/A    Number of Children: N/A  . Years of Education: N/A   Occupational History  . Not on file.   Social History Main Topics  . Smoking status: Former Smoker    Quit date: 01/29/1986  . Smokeless tobacco: Not on file  . Alcohol Use: Yes  . Drug Use: Yes  . Sexually Active: Yes   Other Topics Concern  . Not on file   Social History Narrative  . No narrative on file    Past Surgical History  Procedure Laterality Date  . Vasectomy    . Tonsillectomy      Family History  Problem Relation Age of Onset  . Diabetes Mother   . Alzheimer's disease Mother   . Cancer Father     prostate  . Diabetes Maternal Uncle   . Stroke Paternal Uncle   . Diabetes Maternal Grandmother   . Heart disease Maternal Grandfather     No Known Allergies  Current Outpatient Prescriptions on File Prior to Visit  Medication Sig Dispense Refill  . Cholecalciferol (VITAMIN D) 1000 UNITS capsule Take 1,000 Units by  mouth daily.        . Misc Natural Products (OSTEO BI-FLEX ADV JOINT SHIELD PO) Take by mouth 2 (two) times daily.        . Multiple Vitamin (MULTIVITAMIN) tablet Take 1 tablet by mouth daily.        . Omega-3 Fatty Acids (FISH OIL) 1200 MG CAPS Take 1,200 mg by mouth daily.        . sildenafil (VIAGRA) 50 MG tablet Take 50 mg by mouth daily as needed.        . vitamin E 400 UNIT capsule Take 400 Units by mouth 2 (two) times daily.        [name] atorvastatin (LIPITOR) 20 MG tablet Take 1 tablet (20 mg total) by mouth daily.  90 tablet  3   No current facility-administered medications on file prior to visit.    BP 140/70  Pulse 72  Temp(Src) 98.2 F (36.8 C)  Resp 16  Ht 5\' 11"  (1.803 m)  Wt 198 lb (89.812 kg)  BMI 27.63 kg/m2       Objective:   Physical Exam  Nursing note and vitals reviewed. Constitutional: He appears well-developed and well-nourished.  HENT:  Head: Normocephalic and atraumatic.  Eyes: Conjunctivae are normal. Pupils are equal, round, and reactive to light.  Neck: Normal range of motion. Neck supple.  Cardiovascular: Normal rate and regular rhythm.   Pulmonary/Chest: Effort normal and breath sounds normal.  Abdominal: Soft. Bowel sounds are normal.          Assessment & Plan:  Lipids at goal CPX in 6 months

## 2013-09-19 ENCOUNTER — Other Ambulatory Visit: Payer: Medicare Other

## 2013-09-26 ENCOUNTER — Encounter: Payer: Medicare Other | Admitting: Internal Medicine

## 2013-10-08 ENCOUNTER — Other Ambulatory Visit (INDEPENDENT_AMBULATORY_CARE_PROVIDER_SITE_OTHER): Payer: Medicare Other

## 2013-10-08 DIAGNOSIS — E785 Hyperlipidemia, unspecified: Secondary | ICD-10-CM

## 2013-10-08 DIAGNOSIS — Z0000 Encounter for general adult medical examination without abnormal findings: Secondary | ICD-10-CM

## 2013-10-08 LAB — BASIC METABOLIC PANEL
Calcium: 9 mg/dL (ref 8.4–10.5)
GFR: 109.74 mL/min (ref >60.00)
Potassium: 4.3 mEq/L (ref 3.5–5.1)
Sodium: 140 mEq/L (ref 135–145)

## 2013-10-08 LAB — HEPATIC FUNCTION PANEL
ALT: 21 U/L (ref 0–53)
AST: 31 U/L (ref 0–37)
Albumin: 4 g/dL (ref 3.5–5.2)
Alkaline Phosphatase: 58 U/L (ref 39–117)
Total Bilirubin: 0.9 mg/dL (ref 0.3–1.2)

## 2013-10-08 LAB — CBC WITH DIFFERENTIAL/PLATELET
Basophils Absolute: 0.1 10*3/uL (ref 0.0–0.1)
Eosinophils Relative: 3.5 % (ref 0.0–5.0)
HCT: 42.6 % (ref 39.0–52.0)
Hemoglobin: 14.2 g/dL (ref 13.0–17.0)
Lymphocytes Relative: 36.8 % (ref 12.0–46.0)
Lymphs Abs: 2.4 10*3/uL (ref 0.7–4.0)
Monocytes Relative: 9.5 % (ref 3.0–12.0)
Neutro Abs: 3.2 10*3/uL (ref 1.4–7.7)
RDW: 14.3 % (ref 11.5–14.6)
WBC: 6.4 10*3/uL (ref 4.5–10.5)

## 2013-10-08 LAB — LIPID PANEL
Cholesterol: 162 mg/dL (ref 0–200)
LDL Cholesterol: 88 mg/dL (ref 0–99)
Total CHOL/HDL Ratio: 3
Triglycerides: 67 mg/dL (ref 0.0–149.0)
VLDL: 13.4 mg/dL (ref 0.0–40.0)

## 2013-10-08 LAB — POCT URINALYSIS DIPSTICK
Blood, UA: NEGATIVE
Glucose, UA: NEGATIVE
Ketones, UA: NEGATIVE
Spec Grav, UA: 1.025

## 2013-10-08 LAB — TSH: TSH: 3.14 u[IU]/mL (ref 0.35–5.50)

## 2013-10-15 ENCOUNTER — Encounter: Payer: Self-pay | Admitting: Internal Medicine

## 2013-10-15 ENCOUNTER — Ambulatory Visit (INDEPENDENT_AMBULATORY_CARE_PROVIDER_SITE_OTHER): Payer: Medicare Other | Admitting: Internal Medicine

## 2013-10-15 VITALS — BP 140/70 | HR 76 | Temp 98.0°F | Resp 16 | Ht 71.0 in | Wt 199.0 lb

## 2013-10-15 DIAGNOSIS — Z0000 Encounter for general adult medical examination without abnormal findings: Secondary | ICD-10-CM

## 2013-10-15 MED ORDER — ATORVASTATIN CALCIUM 20 MG PO TABS: 20.0000 mg | ORAL_TABLET | Freq: Every day | ORAL | Status: DC

## 2013-10-15 NOTE — Progress Notes (Signed)
   Subjective:    Patient ID: Martin Chase, male    DOB: [birth year], 67 y.o.   MRN: [id number]  HPI CPX Wellness    Review of Systems  Constitutional: Negative for fever and fatigue.  HENT: Negative for congestion, hearing loss and postnasal drip.   Eyes: Negative for discharge, redness and visual disturbance.  Respiratory: Negative for cough, shortness of breath and wheezing.   Cardiovascular: Negative for leg swelling.  Gastrointestinal: Negative for abdominal pain, constipation and abdominal distention.  Genitourinary: Negative for urgency and frequency.  Musculoskeletal: Negative for arthralgias, joint swelling and neck pain.  Skin: Negative for color change and rash.  Neurological: Negative for weakness and light-headedness.  Hematological: Negative for adenopathy.  Psychiatric/Behavioral: Negative for behavioral problems.       Objective:   Physical Exam  Constitutional: He is oriented to person, place, and time. He appears well-developed and well-nourished.  HENT:  Head: Normocephalic and atraumatic.  Eyes: Conjunctivae are normal. Pupils are equal, round, and reactive to light.  Neck: Normal range of motion. Neck supple.  Cardiovascular: Normal rate and regular rhythm.   Pulmonary/Chest: Effort normal and breath sounds normal.  Abdominal: Soft. Bowel sounds are normal.  Central abdominal obesity  Genitourinary: Rectum normal.  prostate  Neurological: He is alert and oriented to person, place, and time.  Skin: Skin is warm.  Psychiatric: He has a normal mood and affect. His behavior is normal.          Assessment & Plan:   Patient presents for yearly preventative medicine examination.   all immunizations and health maintenance protocols were reviewed with the patient and they are up to date with these protocols.   screening laboratory values were reviewed with the patient including screening of hyperlipidemia PSA renal function and hepatic function.   There medications past medical history social history problem list and allergies were reviewed in detail.   Goals were established with regard to weight loss exercise diet in compliance with medications

## 2013-10-15 NOTE — Progress Notes (Signed)
Pre visit review using our clinic review tool, if applicable. No additional management support is needed unless otherwise documented below in the visit note. 

## 2013-10-15 NOTE — Patient Instructions (Signed)
The patient is instructed to continue all medications as prescribed. Schedule followup with check out clerk upon leaving the clinic  

## 2014-01-27 ENCOUNTER — Telehealth: Payer: Self-pay | Admitting: Internal Medicine

## 2014-01-27 MED ORDER — ATORVASTATIN CALCIUM 20 MG PO TABS: 20.0000 mg | ORAL_TABLET | Freq: Every day | ORAL | Status: DC

## 2014-01-27 NOTE — Telephone Encounter (Signed)
Pt needs new rx atorvastatin 20 mg #90 w.refills sent to [facility]

## 2014-01-27 NOTE — Telephone Encounter (Signed)
rx sent in electronically 

## 2014-04-08 ENCOUNTER — Other Ambulatory Visit (INDEPENDENT_AMBULATORY_CARE_PROVIDER_SITE_OTHER): Payer: Commercial Managed Care - HMO

## 2014-04-08 DIAGNOSIS — E785 Hyperlipidemia, unspecified: Secondary | ICD-10-CM

## 2014-04-08 LAB — HEPATIC FUNCTION PANEL
ALBUMIN: 3.8 g/dL (ref 3.5–5.2)
ALT: 21 U/L (ref 0–53)
AST: 31 U/L (ref 0–37)
Alkaline Phosphatase: 54 U/L (ref 39–117)
Bilirubin, Direct: 0.1 mg/dL (ref 0.0–0.3)
TOTAL PROTEIN: 6.8 g/dL (ref 6.0–8.3)
Total Bilirubin: 1 mg/dL (ref 0.2–1.2)

## 2014-04-08 LAB — LIPID PANEL
Cholesterol: 159 mg/dL (ref 0–200)
HDL: 54.8 mg/dL (ref >39.00)
LDL Cholesterol: 90 mg/dL (ref 0–99)
NONHDL: 104.2
TRIGLYCERIDES: 71 mg/dL (ref 0.0–149.0)
Total CHOL/HDL Ratio: 3
VLDL: 14.2 mg/dL (ref 0.0–40.0)

## 2014-04-15 ENCOUNTER — Ambulatory Visit: Payer: Commercial Managed Care - HMO | Admitting: Internal Medicine

## 2014-04-15 ENCOUNTER — Ambulatory Visit: Payer: Medicare Other | Admitting: Internal Medicine

## 2014-04-17 ENCOUNTER — Encounter: Payer: Self-pay | Admitting: Internal Medicine

## 2014-04-17 ENCOUNTER — Ambulatory Visit (INDEPENDENT_AMBULATORY_CARE_PROVIDER_SITE_OTHER): Payer: Commercial Managed Care - HMO | Admitting: Internal Medicine

## 2014-04-17 VITALS — BP 126/62 | HR 64 | Temp 99.1°F | Ht 71.0 in | Wt 208.0 lb

## 2014-04-17 DIAGNOSIS — M1990 Unspecified osteoarthritis, unspecified site: Secondary | ICD-10-CM

## 2014-04-17 DIAGNOSIS — E785 Hyperlipidemia, unspecified: Secondary | ICD-10-CM

## 2014-04-17 DIAGNOSIS — N529 Male erectile dysfunction, unspecified: Secondary | ICD-10-CM

## 2014-04-17 NOTE — Progress Notes (Signed)
Subjective:    Patient ID: Martin Chase, male    DOB: Mar 10, 1946, 68 y.o.   MRN: [id number]  HPI Follow up Lipid management Blood work for lipid maangement ED on viagra   Review of Systems  Constitutional: Negative for fever and fatigue.  HENT: Negative for congestion, hearing loss and postnasal drip.   Eyes: Negative for discharge, redness and visual disturbance.  Respiratory: Negative for cough, shortness of breath and wheezing.   Cardiovascular: Negative for leg swelling.  Gastrointestinal: Negative for abdominal pain, constipation and abdominal distention.  Genitourinary: Negative for urgency and frequency.  Musculoskeletal: Negative for arthralgias, joint swelling and neck pain.  Skin: Negative for color change and rash.  Neurological: Negative for weakness and light-headedness.  Hematological: Negative for adenopathy.  Psychiatric/Behavioral: Negative for behavioral problems.   Past Medical History  Diagnosis Date  . Hyperlipidemia     History   Social History  . Marital Status: Married    Spouse Name: N/A    Number of Children: N/A  . Years of Education: N/A   Occupational History  . Not on file.   Social History Main Topics  . Smoking status: Former Smoker    Quit date: 01/29/1986  . Smokeless tobacco: Not on file  . Alcohol Use: Yes  . Drug Use: Yes  . Sexual Activity: Yes   Other Topics Concern  . Not on file   Social History Narrative  . No narrative on file    Past Surgical History  Procedure Laterality Date  . Vasectomy    . Tonsillectomy      Family History  Problem Relation Age of Onset  . Diabetes Mother   . Alzheimer's disease Mother   . Cancer Father     prostate  . Diabetes Maternal Uncle   . Stroke Paternal Uncle   . Diabetes Maternal Grandmother   . Heart disease Maternal Grandfather     No Known Allergies  Current Outpatient Prescriptions on File Prior to Visit  Medication Sig Dispense Refill  . atorvastatin  (LIPITOR) 20 MG tablet Take 1 tablet (20 mg total) by mouth daily.  90 tablet  3  . Cholecalciferol (VITAMIN D3) 2000 UNITS TABS Take 1 tablet by mouth daily.      [name] co-enzyme Q-10 30 MG capsule Take 30 mg by mouth daily.      . Cyanocobalamin (B-12) 2500 MCG TABS Take 1 tablet by mouth daily at 6 (six) AM.      . Misc Natural Products (OSTEO BI-FLEX ADV JOINT SHIELD PO) Take by mouth 2 (two) times daily.        . Multiple Vitamin (MULTIVITAMIN) tablet Take 1 tablet by mouth daily.        . Omega-3 Fatty Acids (FISH OIL) 1200 MG CAPS Take 1,200 mg by mouth daily.        . sildenafil (VIAGRA) 50 MG tablet Take 50 mg by mouth daily as needed.        . vitamin E 400 UNIT capsule Take 400 Units by mouth 2 (two) times daily.         No current facility-administered medications on file prior to visit.    BP 126/62  Pulse 64  Temp(Src) 99.1 F (37.3 C) (Oral)  Ht 5\' 11"  (1.803 m)  Wt 208 lb (94.348 kg)  BMI 29.02 kg/m2       Objective:   Physical Exam  Constitutional: He appears well-developed and well-nourished.  HENT:  Head: Normocephalic and atraumatic.  Eyes: Conjunctivae are normal. Pupils are equal, round, and reactive to light.  Neck: Normal range of motion. Neck supple.  Cardiovascular: Normal rate and regular rhythm.   Pulmonary/Chest: Effort normal and breath sounds normal.  Abdominal: Soft. Bowel sounds are normal.          Assessment & Plan:  Stable on lipitor Last review of labs    Total  159  HDL  54.8 LDL 90   Liver stable   I have recommned Dr Larose Kells   At [facility]

## 2014-04-17 NOTE — Progress Notes (Signed)
Pre visit review using our clinic review tool, if applicable. No additional management support is needed unless otherwise documented below in the visit note. 

## 2014-04-17 NOTE — Patient Instructions (Signed)
Dr Larose Kells at [place]

## 2014-10-20 ENCOUNTER — Ambulatory Visit (INDEPENDENT_AMBULATORY_CARE_PROVIDER_SITE_OTHER): Payer: Commercial Managed Care - HMO | Admitting: Internal Medicine

## 2014-10-20 ENCOUNTER — Encounter: Payer: Self-pay | Admitting: Internal Medicine

## 2014-10-20 VITALS — BP 151/68 | HR 65 | Temp 98.1°F | Ht 71.0 in | Wt 208.1 lb

## 2014-10-20 DIAGNOSIS — Z0000 Encounter for general adult medical examination without abnormal findings: Secondary | ICD-10-CM | POA: Insufficient documentation

## 2014-10-20 DIAGNOSIS — R7303 Prediabetes: Secondary | ICD-10-CM

## 2014-10-20 DIAGNOSIS — E785 Hyperlipidemia, unspecified: Secondary | ICD-10-CM

## 2014-10-20 DIAGNOSIS — R946 Abnormal results of thyroid function studies: Secondary | ICD-10-CM

## 2014-10-20 DIAGNOSIS — E119 Type 2 diabetes mellitus without complications: Secondary | ICD-10-CM | POA: Insufficient documentation

## 2014-10-20 DIAGNOSIS — R7989 Other specified abnormal findings of blood chemistry: Secondary | ICD-10-CM

## 2014-10-20 DIAGNOSIS — R7309 Other abnormal glucose: Secondary | ICD-10-CM

## 2014-10-20 DIAGNOSIS — Z23 Encounter for immunization: Secondary | ICD-10-CM

## 2014-10-20 DIAGNOSIS — R739 Hyperglycemia, unspecified: Secondary | ICD-10-CM | POA: Insufficient documentation

## 2014-10-20 HISTORY — DX: Prediabetes: R73.03

## 2014-10-20 LAB — LIPID PANEL
CHOL/HDL RATIO: 3
Cholesterol: 181 mg/dL (ref 0–200)
HDL: 63.1 mg/dL (ref >39.00)
LDL Cholesterol: 104 mg/dL — ABNORMAL HIGH (ref 0–99)
NonHDL: 117.9
Triglycerides: 71 mg/dL (ref 0.0–149.0)
VLDL: 14.2 mg/dL (ref 0.0–40.0)

## 2014-10-20 LAB — HEMOGLOBIN A1C: Hgb A1c MFr Bld: 6.3 % (ref 4.6–6.5)

## 2014-10-20 LAB — AST: AST: 26 U/L (ref 0–37)

## 2014-10-20 LAB — BASIC METABOLIC PANEL
BUN: 15 mg/dL (ref 6–23)
CALCIUM: 8.8 mg/dL (ref 8.4–10.5)
CHLORIDE: 104 meq/L (ref 96–112)
CO2: 27 meq/L (ref 19–32)
CREATININE: 0.8 mg/dL (ref 0.4–1.5)
GFR: 106.14 mL/min (ref >60.00)
Glucose, Bld: 107 mg/dL — ABNORMAL HIGH (ref 70–99)
Potassium: 3.9 mEq/L (ref 3.5–5.1)
Sodium: 138 mEq/L (ref 135–145)

## 2014-10-20 LAB — TSH: TSH: 2.51 u[IU]/mL (ref 0.35–4.50)

## 2014-10-20 LAB — ALT: ALT: 18 U/L (ref 0–53)

## 2014-10-20 NOTE — Assessment & Plan Note (Signed)
had A1c of 6.3 in 2008, recheck A1c. He has a healthy lifestyle

## 2014-10-20 NOTE — Patient Instructions (Signed)
Get your blood work before you leave    Please come back to the office ------ for a physical exam. Come back fasting       Fall Prevention and Home Safety Falls cause injuries and can affect all age groups. It is possible to use preventive measures to significantly decrease the likelihood of falls. There are many simple measures which can make your home safer and prevent falls. OUTDOORS  Repair cracks and edges of walkways and driveways.  Remove high doorway thresholds.  Trim shrubbery on the main path into your home.  Have good outside lighting.  Clear walkways of tools, rocks, debris, and clutter.  Check that handrails are not broken and are securely fastened. Both sides of steps should have handrails.  Have leaves, snow, and ice cleared regularly.  Use sand or salt on walkways during winter months.  In the garage, clean up grease or oil spills. BATHROOM  Install night lights.  Install grab bars by the toilet and in the tub and shower.  Use non-skid mats or decals in the tub or shower.  Place a plastic non-slip stool in the shower to sit on, if needed.  Keep floors dry and clean up all water on the floor immediately.  Remove soap buildup in the tub or shower on a regular basis.  Secure bath mats with non-slip, double-sided rug tape.  Remove throw rugs and tripping hazards from the floors. BEDROOMS  Install night lights.  Make sure a bedside light is easy to reach.  Do not use oversized bedding.  Keep a telephone by your bedside.  Have a firm chair with side arms to use for getting dressed.  Remove throw rugs and tripping hazards from the floor. KITCHEN  Keep handles on pots and pans turned toward the center of the stove. Use back burners when possible.  Clean up spills quickly and allow time for drying.  Avoid walking on wet floors.  Avoid hot utensils and knives.  Position shelves so they are not too high or low.  Place commonly used objects  within easy reach.  If necessary, use a sturdy step stool with a grab bar when reaching.  Keep electrical cables out of the way.  Do not use floor polish or wax that makes floors slippery. If you must use wax, use non-skid floor wax.  Remove throw rugs and tripping hazards from the floor. STAIRWAYS  Never leave objects on stairs.  Place handrails on both sides of stairways and use them. Fix any loose handrails. Make sure handrails on both sides of the stairways are as long as the stairs.  Check carpeting to make sure it is firmly attached along stairs. Make repairs to worn or loose carpet promptly.  Avoid placing throw rugs at the top or bottom of stairways, or properly secure the rug with carpet tape to prevent slippage. Get rid of throw rugs, if possible.  Have an electrician put in a light switch at the top and bottom of the stairs. OTHER FALL PREVENTION TIPS  Wear low-heel or rubber-soled shoes that are supportive and fit well. Wear closed toe shoes.  When using a stepladder, make sure it is fully opened and both spreaders are firmly locked. Do not climb a closed stepladder.  Add color or contrast paint or tape to grab bars and handrails in your home. Place contrasting color strips on first and last steps.  Learn and use mobility aids as needed. Install an electrical emergency response system.  Turn on  lights to avoid dark areas. Replace light bulbs that burn out immediately. Get light switches that glow.  Arrange furniture to create clear pathways. Keep furniture in the same place.  Firmly attach carpet with non-skid or double-sided tape.  Eliminate uneven floor surfaces.  Select a carpet pattern that does not visually hide the edge of steps.  Be aware of all pets. OTHER HOME SAFETY TIPS  Set the water temperature for 120 F (48.8 C).  Keep emergency numbers on or near the telephone.  Keep smoke detectors on every level of the home and near sleeping  areas. Document Released: 10/13/2002 Document Revised: 04/23/2012 Document Reviewed: 01/12/2012 [facility] Patient Information 2015 [place], [place]. This information is not intended to replace advice given to you by your health care provider. Make sure you discuss any questions you have with your health care provider.     Preventive Care for Adults   Ages 10 and over  Blood pressure check.** / Every 1 to 2 years.  Lipid and cholesterol check.**/ Every 5 years beginning at age 97.  Lung cancer screening. / Every year if you are aged 79-80 years and have a 30-pack-year history of smoking and currently smoke or have quit within the past 15 years. Yearly screening is stopped once you have quit smoking for at least 15 years or develop a health problem that would prevent you from having lung cancer treatment.  Fecal occult blood test (FOBT) of stool. / Every year beginning at age 47 and continuing until age 46. You may not have to do this test if you get a colonoscopy every 10 years.  Flexible sigmoidoscopy** or colonoscopy.** / Every 5 years for a flexible sigmoidoscopy or every 10 years for a colonoscopy beginning at age 75 and continuing until age 46.  Hepatitis C blood test.** / For all people born from 18 through 1965 and any individual with known risks for hepatitis C.  Abdominal aortic aneurysm (AAA) screening.** / A one-time screening for ages 79 to 71 years who are current or former smokers.  Skin self-exam. / Monthly.  Influenza vaccine. / Every year.  Tetanus, diphtheria, and acellular pertussis (Tdap/Td) vaccine.** / 1 dose of Td every 10 years.  Varicella vaccine.** / Consult your health care provider.  Zoster vaccine.** / 1 dose for adults aged 3 years or older.  Pneumococcal 13-valent conjugate (PCV13) vaccine.** / Consult your health care provider.  Pneumococcal polysaccharide (PPSV23) vaccine.** / 1 dose for all adults aged 55 years and older.  Meningococcal  vaccine.** / Consult your health care provider.  Hepatitis A vaccine.** / Consult your health care provider.  Hepatitis B vaccine.** / Consult your health care provider.  Haemophilus influenzae type b (Hib) vaccine.** / Consult your health care provider. **Family history and personal history of risk and conditions may change your health care provider's recommendations. Document Released: 12/19/2001 Document Revised: 10/28/2013 Document Reviewed: 03/20/2011 [facility] Patient Information 2015 [place], [place]. This information is not intended to replace advice given to you by your health care provider. Make sure you discuss any questions you have with your health care provider.

## 2014-10-20 NOTE — Assessment & Plan Note (Signed)
Td 2010, pneumonia shot 2009, Zostavax 2012. Prevnar today Had a flu shot already Multiple PSAs have been normal, last PSA 10/2013. Will discuss prostate cancer screening next year. Colonoscopy 2009, per GI next  Colonoscopy 7 years. Diet and exercise discussed

## 2014-10-20 NOTE — Progress Notes (Signed)
Pre visit review using our clinic review tool, if applicable. No additional management support is needed unless otherwise documented below in the visit note. 

## 2014-10-20 NOTE — Progress Notes (Signed)
Subjective:    Patient ID: Martin Chase, male    DOB: 1946/01/29, 68 y.o.   MRN: [id number]  DOS:  10/20/2014 Type of visit - description : New patient, transferring from Dr. Arnoldo Morale  Here for Medicare AWV: 1. Risk factors based on Past M, S, F history: reviewed 2. Physical Activities: active, goes to the gym ~ 4/week  3. Depression/mood: neg screening  4. Hearing:  Has hearing aids  5. ADL's:  Independent, drives 6. Fall Risk: no recent falls , prevention discussed  7. home Safety: does feel safe at home  8. Height, weight, & visual acuity: see VS, sees eye doctor q year 9. Counseling: provided 10. Labs ordered based on risk factors: if needed  11. Referral Coordination: if needed 12. Care Plan, see assessment and plan  13. Cognitive Assessment: motor and cognitive skills appropriate  14. Care team updated 15. Personalized plan provided,  see instructions  In addition, today we discussed the following: High cholesterol, good medication compliance. No apparent side effects ED, takes Viagra very rarely BP today 151/68, at home when checked is usually 130/60.   ROS Denies chest pain, difficulty breathing or lower extremity edema No nausea, vomiting, diarrhea or blood in the stools. No dysuria, gross hematuria or difficulty urinating  Past Medical History  Diagnosis Date  . Hyperlipidemia   . Allergy   . Prediabetes 10/20/2014    Past Surgical History  Procedure Laterality Date  . Vasectomy    . Tonsillectomy      History   Social History  . Marital Status: Married    Spouse Name: N/A    Number of Children: 0  . Years of Education: N/A   Occupational History  . retired, [employer]    Social History Main Topics  . Smoking status: Former Smoker    Quit date: 01/29/1986  . Smokeless tobacco: Never Used  . Alcohol Use: 0.0 oz/week    0 Not specified per week     Comment: socially   . Drug Use: Yes  . Sexual Activity: Yes   Other Topics  Concern  . Not on file   Social History Narrative   Household -- pt and wife     Family History  Problem Relation Age of Onset  . Diabetes Mother   . Alzheimer's disease Mother   . Prostate cancer Father     prostate  . Diabetes Maternal Uncle   . Stroke Paternal Uncle   . Diabetes Maternal Grandmother   . Heart disease Maternal Grandfather   . Colon cancer Neg Hx       Medication List       This list is accurate as of: 10/20/14  6:46 PM.  Always use your most recent med list.               ALLERGY PO  Take 1 tablet by mouth daily.     atorvastatin 20 MG tablet  Commonly known as:  LIPITOR  Take 1 tablet (20 mg total) by mouth daily.     B-12 2500 MCG Tabs  Take 1 tablet by mouth daily at 6 (six) AM.     co-enzyme Q-10 30 MG capsule  Take 30 mg by mouth daily.     Fish Oil 1200 MG Caps  Take 1,200 mg by mouth daily.     multivitamin tablet  Take 1 tablet by mouth daily.     OSTEO BI-FLEX ADV JOINT SHIELD PO  Take by  mouth 2 (two) times daily.     sildenafil 50 MG tablet  Commonly known as:  VIAGRA  Take 50 mg by mouth daily as needed.     Vitamin D3 2000 UNITS Tabs  Take 1 tablet by mouth daily.     vitamin E 400 UNIT capsule  Take 400 Units by mouth 2 (two) times daily.           Objective:   Physical Exam BP 151/68 mmHg  Pulse 65  Temp(Src) 98.1 F (36.7 C) (Oral)  Ht 5\' 11"  (1.803 m)  Wt 208 lb 2 oz (94.405 kg)  BMI 29.04 kg/m2  SpO2 96% General -- alert, well-developed, NAD.  Neck --no thyromegaly , normal carotid pulse  HEENT-- Not pale.  Lungs -- normal respiratory effort, no intercostal retractions, no accessory muscle use, and normal breath sounds.  Heart-- normal rate, regular rhythm, no murmur.  Abdomen-- Not distended, good bowel sounds,soft, non-tender. No bruit  Extremities-- no pretibial edema bilaterally  Neurologic--  alert & oriented X3. Speech normal, gait appropriate for age, strength symmetric and appropriate for  age.  Psych-- Cognition and judgment appear intact. Cooperative with normal attention span and concentration. No anxious or depressed appearing.     Assessment & Plan:   Hyperlipidemia,  good compliance with Lipitor, check labs Allergies,  well-controlled with when necessary medications. Last TSH is slightly elevated,  recheck

## 2014-11-02 ENCOUNTER — Other Ambulatory Visit: Payer: Self-pay

## 2014-11-02 ENCOUNTER — Telehealth: Payer: Self-pay | Admitting: Internal Medicine

## 2014-11-02 MED ORDER — ATORVASTATIN CALCIUM 20 MG PO TABS: 20.0000 mg | ORAL_TABLET | Freq: Every day | ORAL | Status: DC

## 2014-11-02 NOTE — Telephone Encounter (Signed)
Caller name: Ebeling,Patsy H Relation to pt: spouse  Call back number: [phone]   Reason for call:  Spouse requesting a refill of atorvastatin (LIPITOR) 20 MG tablet a 90 day supply please send to [facility]

## 2014-11-02 NOTE — Telephone Encounter (Signed)
Atorvastatin sent to [facility] as requested. # 90 and 2 refills.

## 2015-06-16 ENCOUNTER — Encounter: Payer: Self-pay | Admitting: Internal Medicine

## 2015-07-08 ENCOUNTER — Telehealth: Payer: Self-pay | Admitting: Internal Medicine

## 2015-07-08 NOTE — Telephone Encounter (Signed)
Called pt informed the below, pt states that they mentioned to him that it was time for him to renew the supplies so they gave him a list of supplies (that pt left at our office today at front desk) that are mentioned below. Pt will call [facility] and ask them to send paperwork to our office at fax #.

## 2015-07-08 NOTE — Telephone Encounter (Signed)
Caller name: Dequante Relation to pt: self Call back number: [phone] ok to leave message  Pharmacy:  Reason for call: Pt came in office an asked for rx for CPAP Supplies since insurance informed him that after a few years he can have the right to have a Chamber, Tubing, Cushion, Mask, Filter  And CPAP unit check. Pt states the rx has to have those specific things on it and fax it to [facility] fax [phone].

## 2015-07-08 NOTE — Telephone Encounter (Signed)
I did not know he had OSA but on reviewing the chart, previous PCP did RF the supplies before. Okay to refill, will discuss further when he comes back.

## 2015-07-08 NOTE — Telephone Encounter (Signed)
Please inform Pt that [place] will need to fax [place] the paperwork for completion. We do not have paperwork to complete his CPAP supplies. Thank you.

## 2015-07-08 NOTE — Telephone Encounter (Signed)
Last sleep study performed on 01/23/2004. Uses 9 cm titrated through CPAP. Please advise if okay to send orders for CPAP supplies. Thank you.

## 2015-07-09 NOTE — Telephone Encounter (Signed)
Received CPAP orders signed by Dr. Larose Kells. Orders faxed back to [facility] at [phone]. Form sent for scanning into Pt's chart.

## 2015-07-09 NOTE — Telephone Encounter (Signed)
Received CPAP supply order from [facility]. Given to Dr. Larose Kells for review and signature.

## 2015-07-09 NOTE — Telephone Encounter (Signed)
Received fax confirmation on 07/09/2015 at 0808.

## 2015-08-25 ENCOUNTER — Encounter: Payer: Self-pay | Admitting: Gastroenterology

## 2015-09-16 ENCOUNTER — Telehealth: Payer: Self-pay | Admitting: Internal Medicine

## 2015-09-16 DIAGNOSIS — Z1211 Encounter for screening for malignant neoplasm of colon: Secondary | ICD-10-CM

## 2015-09-16 NOTE — Telephone Encounter (Signed)
Referral placed.

## 2015-09-16 NOTE — Telephone Encounter (Signed)
Okay for Pt to proceed with Cscope referral?

## 2015-09-16 NOTE — Telephone Encounter (Signed)
°  Relation to [id number] Call back [place]:  Reason for call:  Patient requesting orders for colonoscopy for [facility]

## 2015-09-16 NOTE — Telephone Encounter (Signed)
Please do

## 2015-11-25 ENCOUNTER — Encounter: Payer: Self-pay | Admitting: Gastroenterology

## 2015-11-25 ENCOUNTER — Ambulatory Visit (INDEPENDENT_AMBULATORY_CARE_PROVIDER_SITE_OTHER): Payer: Commercial Managed Care - HMO | Admitting: Gastroenterology

## 2015-11-25 VITALS — BP 140/70 | HR 66 | Ht 71.0 in | Wt 216.0 lb

## 2015-11-25 DIAGNOSIS — E785 Hyperlipidemia, unspecified: Secondary | ICD-10-CM

## 2015-11-25 DIAGNOSIS — Z1211 Encounter for screening for malignant neoplasm of colon: Secondary | ICD-10-CM | POA: Diagnosis not present

## 2015-11-25 NOTE — Patient Instructions (Signed)
Colonoscopy recall due 2019 with Dr Havery Moros [phone]

## 2015-11-25 NOTE — Progress Notes (Signed)
HPI :  70 y/o male with history of hyperlipidemia, seen in consultation to discuss if he needs a screening colonoscopy at this time.  His last colonoscopy was in 2009 with report as below. He did not have any adenomas noted. He  reports a history of aspirin-use causing bleeding ulcer in the very remote past, but he denies any blood in the stools at this time. He is having one BM per day and is regular. No straining, no constipation or diarrhea. No abdominal pains. He thinks he has gained a little weight recently. No vomiting, eating well. No dysphagia.  No FH of colon cancer. Father had prostate cancer  He overall feels well without complaints today. He denies any reported history of anemia.   Colonoscopy 10/09 - diverticulosis, ascending colon polyp c/w lymphoid aggregate   Past Medical History  Diagnosis Date  . Hyperlipidemia   . Allergy   . Prediabetes 10/20/2014     Past Surgical History  Procedure Laterality Date  . Vasectomy    . Tonsillectomy     Family History  Problem Relation Age of Onset  . Diabetes Mother   . Alzheimer's disease Mother   . Prostate cancer Father   . Diabetes Maternal Uncle   . Stroke Paternal Uncle   . Diabetes Maternal Grandmother   . Heart disease Maternal Grandfather   . Colon cancer Neg Hx    Social History  Substance Use Topics  . Smoking status: Former Smoker -- 5 years    Types: Cigarettes    Quit date: 01/29/1986  . Smokeless tobacco: Never Used  . Alcohol Use: 0.0 oz/week    0 Standard drinks or equivalent per week     Comment: socially    Current Outpatient Prescriptions  Medication Sig Dispense Refill  . atorvastatin (LIPITOR) 20 MG tablet Take 1 tablet (20 mg total) by mouth daily. 90 tablet 2  . Chlorpheniramine Maleate (ALLERGY PO) Take 1 tablet by mouth daily.    . Cholecalciferol (VITAMIN D3) 2000 UNITS TABS Take 1 tablet by mouth daily.    [name] co-enzyme Q-10 30 MG capsule Take 30 mg by mouth daily.    . Cyanocobalamin  (B-12) 2500 MCG TABS Take 1 tablet by mouth daily at 6 (six) AM.    . Misc Natural Products (OSTEO BI-FLEX ADV JOINT SHIELD PO) Take by mouth 2 (two) times daily.      . Multiple Vitamin (MULTIVITAMIN) tablet Take 1 tablet by mouth daily.      . Omega-3 Fatty Acids (FISH OIL) 1200 MG CAPS Take 1,200 mg by mouth daily.      . vitamin C (ASCORBIC ACID) 500 MG tablet Take 500 mg by mouth daily.    . vitamin E 400 UNIT capsule Take 400 Units by mouth 2 (two) times daily.       No current facility-administered medications for this visit.   No Known Allergies   Review of Systems: All systems reviewed and negative except where noted in HPI.   Lab Results  Component Value Date   WBC 6.4 10/08/2013   HGB 14.2 10/08/2013   HCT 42.6 10/08/2013   MCV 90.2 10/08/2013   PLT 212.0 10/08/2013    Lab Results  Component Value Date   CREATININE 0.8 10/20/2014   BUN 15 10/20/2014   NA 138 10/20/2014   K 3.9 10/20/2014   CL 104 10/20/2014   CO2 27 10/20/2014    Lab Results  Component Value Date   ALT 18  10/20/2014   AST 26 10/20/2014   ALKPHOS 54 04/08/2014   BILITOT 1.0 04/08/2014     Physical Exam: BP 140/70 mmHg  Pulse 66  Ht 5\' 11"  (1.803 m)  Wt 216 lb (97.977 kg)  BMI 30.14 kg/m2 Constitutional: Pleasant,well-developed, male in no acute distress. HEENT: Normocephalic and atraumatic. Conjunctivae are normal. No scleral icterus. Neck supple.  Cardiovascular: Normal rate, regular rhythm.  Pulmonary/chest: Effort normal and breath sounds normal. No wheezing, rales or rhonchi. Abdominal: Soft, nondistended, nontender. Bowel sounds active throughout. There are no masses palpable. No hepatomegaly. Extremities: no edema Lymphadenopathy: No cervical adenopathy noted. Neurological: Alert and oriented to person place and time. Skin: Skin is warm and dry. No rashes noted. Psychiatric: Normal mood and affect. Behavior is normal.   ASSESSMENT AND PLAN: 70 y/o male here to discuss the  timing of when he is next due for colon cancer screening. He has no FH of colon cancer, no anemia, and no alarm symptoms or rectal bleeding at this time. His last colonoscopy in 2009 showed a benign polyp, no adenomatous changes, and diverticulosis. Based on current ACG guidelines for colon cancer screening, based on his last exam and history provided, he is next due for colon cancer screening in 2019 given he is asymptomatic. He was happy to hear this. I counseled him that if he has rectal bleeding, abdominal pain, changes in bowel habits, etc, in the interim, he should contact us and we would consider an exam sooner. He agreed. Recall placed for 2019. He can follow up as needed in the interim.   Lompico Cellar, MD [facility] Pager [phone]

## 2016-01-31 ENCOUNTER — Ambulatory Visit (INDEPENDENT_AMBULATORY_CARE_PROVIDER_SITE_OTHER): Payer: Commercial Managed Care - HMO | Admitting: Internal Medicine

## 2016-01-31 ENCOUNTER — Telehealth: Payer: Self-pay | Admitting: Internal Medicine

## 2016-01-31 ENCOUNTER — Encounter: Payer: Self-pay | Admitting: Internal Medicine

## 2016-01-31 VITALS — BP 118/72 | HR 52 | Temp 97.8°F | Ht 71.0 in | Wt 214.1 lb

## 2016-01-31 DIAGNOSIS — Z0000 Encounter for general adult medical examination without abnormal findings: Secondary | ICD-10-CM

## 2016-01-31 DIAGNOSIS — Z9289 Personal history of other medical treatment: Secondary | ICD-10-CM

## 2016-01-31 DIAGNOSIS — Z09 Encounter for follow-up examination after completed treatment for conditions other than malignant neoplasm: Secondary | ICD-10-CM | POA: Insufficient documentation

## 2016-01-31 DIAGNOSIS — Z125 Encounter for screening for malignant neoplasm of prostate: Secondary | ICD-10-CM

## 2016-01-31 DIAGNOSIS — R739 Hyperglycemia, unspecified: Secondary | ICD-10-CM | POA: Diagnosis not present

## 2016-01-31 LAB — BASIC METABOLIC PANEL
BUN: 14 mg/dL (ref 6–23)
CHLORIDE: 103 meq/L (ref 96–112)
CO2: 31 mEq/L (ref 19–32)
Calcium: 9.2 mg/dL (ref 8.4–10.5)
Creatinine, Ser: 0.78 mg/dL (ref 0.40–1.50)
GFR: 104.18 mL/min (ref >60.00)
GLUCOSE: 98 mg/dL (ref 70–99)
POTASSIUM: 3.9 meq/L (ref 3.5–5.1)
Sodium: 141 mEq/L (ref 135–145)

## 2016-01-31 LAB — CBC WITH DIFFERENTIAL/PLATELET
BASOS PCT: 1 % (ref 0.0–3.0)
Basophils Absolute: 0.1 10*3/uL (ref 0.0–0.1)
EOS ABS: 0.3 10*3/uL (ref 0.0–0.7)
EOS PCT: 3.9 % (ref 0.0–5.0)
HCT: 44.1 % (ref 39.0–52.0)
Hemoglobin: 14.7 g/dL (ref 13.0–17.0)
LYMPHS PCT: 29.2 % (ref 12.0–46.0)
Lymphs Abs: 2.2 10*3/uL (ref 0.7–4.0)
MCHC: 33.2 g/dL (ref 30.0–36.0)
MCV: 90.4 fl (ref 78.0–100.0)
MONOS PCT: 7.1 % (ref 3.0–12.0)
Monocytes Absolute: 0.5 10*3/uL (ref 0.1–1.0)
NEUTROS ABS: 4.4 10*3/uL (ref 1.4–7.7)
NEUTROS PCT: 58.8 % (ref 43.0–77.0)
Platelets: 212 10*3/uL (ref 150.0–400.0)
RBC: 4.88 Mil/uL (ref 4.22–5.81)
RDW: 14.9 % (ref 11.5–15.5)
WBC: 7.4 10*3/uL (ref 4.0–10.5)

## 2016-01-31 LAB — LIPID PANEL
CHOLESTEROL: 177 mg/dL (ref 0–200)
HDL: 58.4 mg/dL (ref >39.00)
LDL Cholesterol: 98 mg/dL (ref 0–99)
NonHDL: 118.3
Total CHOL/HDL Ratio: 3
Triglycerides: 101 mg/dL (ref 0.0–149.0)
VLDL: 20.2 mg/dL (ref 0.0–40.0)

## 2016-01-31 LAB — AST: AST: 22 U/L (ref 0–37)

## 2016-01-31 LAB — HEMOGLOBIN A1C: HEMOGLOBIN A1C: 6.2 % (ref 4.6–6.5)

## 2016-01-31 LAB — PSA: PSA: 3.01 ng/mL (ref 0.10–4.00)

## 2016-01-31 LAB — ALT: ALT: 13 U/L (ref 0–53)

## 2016-01-31 LAB — HEPATITIS C ANTIBODY: HCV AB: NEGATIVE

## 2016-01-31 NOTE — Progress Notes (Signed)
Subjective:    Patient ID: Martin Chase, male    DOB: 30-Jun-1946, 70 y.o.   MRN: [id number]  DOS:  01/31/2016 Type of visit - description : CPX Interval history:  No major concerns, good med  compliance , feeling well, remains active.   Review of Systems  Constitutional: No fever. No chills. No unexplained wt changes. No unusual sweats  HEENT: No dental problems, no ear discharge, no facial swelling, no voice changes. No eye discharge, no eye  redness , no  intolerance to light   Respiratory: No wheezing , no  difficulty breathing. No cough , no mucus production  Cardiovascular: No CP, no leg swelling , no  Palpitations  GI: no nausea, no vomiting, no diarrhea , no  abdominal pain.  No blood in the stools. No dysphagia, no odynophagia    Endocrine: No polyphagia, no polyuria , no polydipsia  GU: No dysuria, gross hematuria, difficulty urinating. Very seldom has mild urinary difficulty. Occ urinary urgency, no frequency.  Musculoskeletal: No joint swellings or unusual aches or pains  Skin: No change in the color of the skin, palor , no  Rash  Allergic, immunologic: No environmental allergies , no  food allergies  Neurological: No dizziness no  syncope. No headaches. No diplopia, no slurred, no slurred speech, no motor deficits, no facial  Numbness  Hematological: No enlarged lymph nodes, no easy bruising , no unusual bleedings  Psychiatry: No suicidal ideas, no hallucinations, no beavior problems, no confusion.  No unusual/severe anxiety, no depression  Past Medical History  Diagnosis Date  . Hyperlipidemia   . Allergy   . Prediabetes 10/20/2014    Past Surgical History  Procedure Laterality Date  . Vasectomy    . Tonsillectomy      Social History   Social History  . Marital Status: Married    Spouse Name: N/A  . Number of Children: 0  . Years of Education: N/A   Occupational History  . retired, [employer]    Social History Main Topics    . Smoking status: Former Smoker -- 5 years    Types: Cigarettes    Quit date: 01/29/1986  . Smokeless tobacco: Never Used  . Alcohol Use: 0.0 oz/week    0 Standard drinks or equivalent per week     Comment: socially   . Drug Use: No  . Sexual Activity: Yes   Other Topics Concern  . Not on file   Social History Narrative   Household -- pt and wife     Family History  Problem Relation Age of Onset  . Diabetes Mother   . Alzheimer's disease Mother   . Prostate cancer Father   . Diabetes Maternal Uncle   . Stroke Paternal Uncle   . Diabetes Maternal Grandmother   . Heart disease Maternal Grandfather   . Colon cancer Neg Hx        Medication List       This list is accurate as of: 01/31/16 12:56 PM.  Always use your most recent med list.               ALLERGY PO  Take 1 tablet by mouth daily.     atorvastatin 20 MG tablet  Commonly known as:  LIPITOR  Take 1 tablet (20 mg total) by mouth daily.     B-12 2500 MCG Tabs  Take 1 tablet by mouth daily at 6 (six) AM.     co-enzyme Q-10 30 MG  capsule  Take 30 mg by mouth daily.     Fish Oil 1200 MG Caps  Take 1,200 mg by mouth daily.     multivitamin tablet  Take 1 tablet by mouth daily.     OSTEO BI-FLEX ADV JOINT SHIELD PO  Take by mouth 2 (two) times daily.     TURMERIC PO  Take 1,000 mg by mouth daily.     vitamin C 500 MG tablet  Commonly known as:  ASCORBIC ACID  Take 500 mg by mouth daily.     Vitamin D3 2000 units Tabs  Take 1 tablet by mouth daily.     vitamin E 400 UNIT capsule  Take 400 Units by mouth 2 (two) times daily.           Objective:   Physical Exam BP 118/72 mmHg  Pulse 52  Temp(Src) 97.8 F (36.6 C) (Oral)  Ht 5\' 11"  (1.803 m)  Wt 214 lb 2 oz (97.126 kg)  BMI 29.88 kg/m2  SpO2 97%  General:   Well developed, well nourished . NAD.  Neck: No  thyromegaly   HEENT:  Normocephalic . Face symmetric, atraumatic Lungs:  CTA B Normal respiratory effort, no intercostal  retractions, no accessory muscle use. Heart: RRR,  no murmur.  No pretibial edema bilaterally  Abdomen:  Not distended, soft, non-tender. No rebound or rigidity.   Skin: Exposed areas without rash. Not pale. Not jaundice Rectal:  External abnormalities: none. Normal sphincter tone. No rectal masses or tenderness.  Stool brown  Prostate: Prostate gland firm and smooth, no enlargement, nodularity, tenderness, mass, asymmetry or induration.  Neurologic:  alert & oriented X3.  Speech normal, gait appropriate for age and unassisted Strength symmetric and appropriate for age.  Psych: Cognition and judgment appear intact.  Cooperative with normal attention span and concentration.  Behavior appropriate. No anxious or depressed appearing.    Assessment & Plan:   Assessment Prediabetes Hyperlipidemia DJD  Allergies PUD per pt, dx early 2000s, related to ASA, was told not to take   Plan: Prediabetes: Doing well with lifestyle, check A1c Hyperlipidemia: Continue Lipitor, check labs Declined  aspirin 81 daily d/t h/o PUD RTC one year

## 2016-01-31 NOTE — Telephone Encounter (Signed)
Pt called and asked if Hep C screening could be added to labs drawn today. He asked if he could come back for tdap and if he will get billed separately for that or if it will be covered.

## 2016-01-31 NOTE — Assessment & Plan Note (Signed)
Prediabetes: Doing well with lifestyle, check A1c Hyperlipidemia: Continue Lipitor, check labs Declined  aspirin 81 daily d/t h/o PUD RTC one year

## 2016-01-31 NOTE — Telephone Encounter (Signed)
Spoke w/ Pt, informed him that w/o family hx of liver cancer, or disease, personal history or hx of blood transfusion (which Pt has had), insurance would not pay for testing, however since he does have hx of blood transfusion, insurance should have no trouble covering. Also, informed Pt that he is not due for TD until 2020, last was 07/2009. Pt verbalized understanding. Spoke w/ Kristi in lab requesting add on for Hep C Antibody.

## 2016-01-31 NOTE — Assessment & Plan Note (Addendum)
Td 2010, pneumonia shot 2009, Zostavax 2012. Prevnar 2015  prostate cancer screening: pron/cons discussed, DRE today negative, check a PSA Colonoscopy 2009, per GI next 2019 Diet and exercise discussed Labs: BMP, AST, ALT, FLP, CBC, A1c, PSA EKG: Sinus bradycardia. No acute changes

## 2016-01-31 NOTE — Telephone Encounter (Signed)
Patient returning your call.

## 2016-01-31 NOTE — Patient Instructions (Signed)
Get your blood work before you leave   Next visit: one year    Please consider visit these websites for more information about the healthcare power of attorney.  www.begintheconversation.org  theconversationproject.org    Fall Prevention and Home Safety Falls cause injuries and can affect all age groups. It is possible to use preventive measures to significantly decrease the likelihood of falls. There are many simple measures which can make your home safer and prevent falls. OUTDOORS  Repair cracks and edges of walkways and driveways.  Remove high doorway thresholds.  Trim shrubbery on the main path into your home.  Have good outside lighting.  Clear walkways of tools, rocks, debris, and clutter.  Check that handrails are not broken and are securely fastened. Both sides of steps should have handrails.  Have leaves, snow, and ice cleared regularly.  Use sand or salt on walkways during winter months.  In the garage, clean up grease or oil spills. BATHROOM  Install night lights.  Install grab bars by the toilet and in the tub and shower.  Use non-skid mats or decals in the tub or shower.  Place a plastic non-slip stool in the shower to sit on, if needed.  Keep floors dry and clean up all water on the floor immediately.  Remove soap buildup in the tub or shower on a regular basis.  Secure bath mats with non-slip, double-sided rug tape.  Remove throw rugs and tripping hazards from the floors. BEDROOMS  Install night lights.  Make sure a bedside light is easy to reach.  Do not use oversized bedding.  Keep a telephone by your bedside.  Have a firm chair with side arms to use for getting dressed.  Remove throw rugs and tripping hazards from the floor. KITCHEN  Keep handles on pots and pans turned toward the center of the stove. Use back burners when possible.  Clean up spills quickly and allow time for drying.  Avoid walking on wet floors.  Avoid hot  utensils and knives.  Position shelves so they are not too high or low.  Place commonly used objects within easy reach.  If necessary, use a sturdy step stool with a grab bar when reaching.  Keep electrical cables out of the way.  Do not use floor polish or wax that makes floors slippery. If you must use wax, use non-skid floor wax.  Remove throw rugs and tripping hazards from the floor. STAIRWAYS  Never leave objects on stairs.  Place handrails on both sides of stairways and use them. Fix any loose handrails. Make sure handrails on both sides of the stairways are as long as the stairs.  Check carpeting to make sure it is firmly attached along stairs. Make repairs to worn or loose carpet promptly.  Avoid placing throw rugs at the top or bottom of stairways, or properly secure the rug with carpet tape to prevent slippage. Get rid of throw rugs, if possible.  Have an electrician put in a light switch at the top and bottom of the stairs. OTHER FALL PREVENTION TIPS  Wear low-heel or rubber-soled shoes that are supportive and fit well. Wear closed toe shoes.  When using a stepladder, make sure it is fully opened and both spreaders are firmly locked. Do not climb a closed stepladder.  Add color or contrast paint or tape to grab bars and handrails in your home. Place contrasting color strips on first and last steps.  Learn and use mobility aids as needed. Install an  electrical emergency response system.  Turn on lights to avoid dark areas. Replace light bulbs that burn out immediately. Get light switches that glow.  Arrange furniture to create clear pathways. Keep furniture in the same place.  Firmly attach carpet with non-skid or double-sided tape.  Eliminate uneven floor surfaces.  Select a carpet pattern that does not visually hide the edge of steps.  Be aware of all pets. OTHER HOME SAFETY TIPS  Set the water temperature for 120 F (48.8 C).  Keep emergency numbers on  or near the telephone.  Keep smoke detectors on every level of the home and near sleeping areas. Document Released: 10/13/2002 Document Revised: 04/23/2012 Document Reviewed: 01/12/2012 [facility] Patient Information 2015 [employer], [place]. This information is not intended to replace advice given to you by your health care provider. Make sure you discuss any questions you have with your health care provider.   Preventive Care for Adults Ages 38 and over  Blood pressure check.** / Every 1 to 2 years.  Lipid and cholesterol check.**/ Every 5 years beginning at age 86.  Lung cancer screening. / Every year if you are aged 59-80 years and have a 30-pack-year history of smoking and currently smoke or have quit within the past 15 years. Yearly screening is stopped once you have quit smoking for at least 15 years or develop a health problem that would prevent you from having lung cancer treatment.  Fecal occult blood test (FOBT) of stool. / Every year beginning at age 18 and continuing until age 7. You may not have to do this test if you get a colonoscopy every 10 years.  Flexible sigmoidoscopy** or colonoscopy.** / Every 5 years for a flexible sigmoidoscopy or every 10 years for a colonoscopy beginning at age 80 and continuing until age 27.  Hepatitis C blood test.** / For all people born from [birth year] through 1967 and any individual with known risks for hepatitis C.  Abdominal aortic aneurysm (AAA) screening.** / A one-time screening for ages 73 to 40 years who are current or former smokers.  Skin self-exam. / Monthly.  Influenza vaccine. / Every year.  Tetanus, diphtheria, and acellular pertussis (Tdap/Td) vaccine.** / 1 dose of Td every 10 years.  Varicella vaccine.** / Consult your health care provider.  Zoster vaccine.** / 1 dose for adults aged 39 years or older.  Pneumococcal 13-valent conjugate (PCV13) vaccine.** / Consult your health care provider.  Pneumococcal polysaccharide (PPSV23)  vaccine.** / 1 dose for all adults aged 61 years and older.  Meningococcal vaccine.** / Consult your health care provider.  Hepatitis A vaccine.** / Consult your health care provider.  Hepatitis B vaccine.** / Consult your health care provider.  Haemophilus influenzae type b (Hib) vaccine.** / Consult your health care provider. **Family history and personal history of risk and conditions may change your health care provider's recommendations. Document Released: 12/19/2001 Document Revised: 10/28/2013 Document Reviewed: 03/20/2011 [facility] Patient Information 2015 [place], [place]. This information is not intended to replace advice given to you by your health care provider. Make sure you discuss any questions you have with your health care provider.

## 2016-01-31 NOTE — Addendum Note (Signed)
Addended by: Caffie Pinto on: 01/31/2016 03:51 PM   Modules accepted: Orders

## 2016-01-31 NOTE — Progress Notes (Signed)
Pre visit review using our clinic review tool, if applicable. No additional management support is needed unless otherwise documented below in the visit note. 

## 2016-01-31 NOTE — Telephone Encounter (Signed)
Tried calling Pt back, spoke w/ Patsy, she informed me that Pt was not there at that time but would have him call back.

## 2016-02-21 ENCOUNTER — Telehealth: Payer: Self-pay | Admitting: Internal Medicine

## 2016-02-21 MED ORDER — ATORVASTATIN CALCIUM 20 MG PO TABS: 20.0000 mg | ORAL_TABLET | Freq: Every day | ORAL | Status: DC

## 2016-02-21 NOTE — Telephone Encounter (Signed)
Can be reached: [phone] Pharmacy: [facility]  Reason for call: Pt called for refill on atorvastatin 20mg . Takes 1/day. He has been cutting his wife's 40mg  in half bc he ran out (and she is no longer taking it).

## 2016-02-21 NOTE — Telephone Encounter (Signed)
Rx sent 

## 2016-06-05 ENCOUNTER — Telehealth: Payer: Self-pay

## 2016-06-05 DIAGNOSIS — M1711 Unilateral primary osteoarthritis, right knee: Secondary | ICD-10-CM

## 2016-06-05 NOTE — Telephone Encounter (Signed)
Okay to place referral

## 2016-06-05 NOTE — Telephone Encounter (Signed)
Referral placed.

## 2016-06-05 NOTE — Telephone Encounter (Signed)
That is okay, DX DJD knee

## 2016-06-05 NOTE — Telephone Encounter (Signed)
[name] called to request a referral to [facility] for his right knee pain [phone]

## 2017-01-05 ENCOUNTER — Other Ambulatory Visit: Payer: Self-pay

## 2017-01-05 MED ORDER — ATORVASTATIN CALCIUM 20 MG PO TABS: 20.0000 mg | ORAL_TABLET | Freq: Every day | ORAL | 0 refills | Status: DC

## 2017-01-31 ENCOUNTER — Ambulatory Visit (INDEPENDENT_AMBULATORY_CARE_PROVIDER_SITE_OTHER): Payer: Medicare HMO | Admitting: Internal Medicine

## 2017-01-31 ENCOUNTER — Encounter: Payer: Self-pay | Admitting: Internal Medicine

## 2017-01-31 VITALS — BP 124/78 | HR 61 | Temp 98.1°F | Resp 14 | Ht 71.0 in | Wt 216.2 lb

## 2017-01-31 DIAGNOSIS — Z0000 Encounter for general adult medical examination without abnormal findings: Secondary | ICD-10-CM | POA: Diagnosis not present

## 2017-01-31 DIAGNOSIS — R739 Hyperglycemia, unspecified: Secondary | ICD-10-CM

## 2017-01-31 DIAGNOSIS — E875 Hyperkalemia: Secondary | ICD-10-CM

## 2017-01-31 DIAGNOSIS — R399 Unspecified symptoms and signs involving the genitourinary system: Secondary | ICD-10-CM

## 2017-01-31 LAB — URINALYSIS, ROUTINE W REFLEX MICROSCOPIC
BILIRUBIN URINE: NEGATIVE
HGB URINE DIPSTICK: NEGATIVE
KETONES UR: NEGATIVE
Leukocytes, UA: NEGATIVE
NITRITE: NEGATIVE
RBC / HPF: NONE SEEN (ref 0–?)
Specific Gravity, Urine: 1.01 (ref 1.000–1.030)
Total Protein, Urine: NEGATIVE
URINE GLUCOSE: NEGATIVE
Urobilinogen, UA: 0.2 (ref 0.0–1.0)
pH: 5.5 (ref 5.0–8.0)

## 2017-01-31 LAB — COMPREHENSIVE METABOLIC PANEL
ALK PHOS: 73 U/L (ref 39–117)
ALT: 16 U/L (ref 0–53)
AST: 21 U/L (ref 0–37)
Albumin: 4.3 g/dL (ref 3.5–5.2)
BILIRUBIN TOTAL: 0.7 mg/dL (ref 0.2–1.2)
BUN: 14 mg/dL (ref 6–23)
CO2: 33 mEq/L — ABNORMAL HIGH (ref 19–32)
CREATININE: 0.86 mg/dL (ref 0.40–1.50)
Calcium: 9.4 mg/dL (ref 8.4–10.5)
Chloride: 105 mEq/L (ref 96–112)
GFR: 92.82 mL/min (ref >60.00)
GLUCOSE: 114 mg/dL — AB (ref 70–99)
Potassium: 5.7 mEq/L — ABNORMAL HIGH (ref 3.5–5.1)
SODIUM: 143 meq/L (ref 135–145)
TOTAL PROTEIN: 7.1 g/dL (ref 6.0–8.3)

## 2017-01-31 LAB — LIPID PANEL
Cholesterol: 180 mg/dL (ref 0–200)
HDL: 55.4 mg/dL (ref >39.00)
LDL Cholesterol: 106 mg/dL — ABNORMAL HIGH (ref 0–99)
NONHDL: 124.54
Total CHOL/HDL Ratio: 3
Triglycerides: 95 mg/dL (ref 0.0–149.0)
VLDL: 19 mg/dL (ref 0.0–40.0)

## 2017-01-31 LAB — HEMOGLOBIN A1C: HEMOGLOBIN A1C: 6.3 % (ref 4.6–6.5)

## 2017-01-31 LAB — PSA: PSA: 4.19 ng/mL — ABNORMAL HIGH (ref 0.10–4.00)

## 2017-01-31 NOTE — Progress Notes (Signed)
Pre visit review using our clinic review tool, if applicable. No additional management support is needed unless otherwise documented below in the visit note. 

## 2017-01-31 NOTE — Patient Instructions (Signed)
GO TO THE LAB : Get the blood work     GO TO THE FRONT DESK Schedule your next appointment for a   checkup in 6 months  Please schedule Medicare wellness one of our nurses

## 2017-01-31 NOTE — Assessment & Plan Note (Addendum)
--  Td 2010, pneumonia shot 2009, Zostavax 2012. Prevnar 2015 --prostate cancer screening: DRE repeated today showing a slt enlarged gland, recheck a PSA. --Colonoscopy 2009, per GI next 2019 --Diet and exercise discussed Labs: CMP, FLP, A1c, PSA, UA, urine culture

## 2017-01-31 NOTE — Assessment & Plan Note (Signed)
Prediabetes: Check A1c Hyperlipidemia: Continue Lipitor, check labs LUTS: Chronic urinary  urgency. DRE showed a slightly enlarged prostate gland, check a PSA, UA urine culture Left groin pain: No evidence of a hernia, hip pain?. Rx observation for now Difficulty getting from up from chairs: Neuro exam normal, discussed PT for leg strengthening, declined for now, plans to go to the gym more regularly . He takes several OTC supplements, recommend to limit vitamins to a B12 and a multivitamin. RTC 6 months

## 2017-01-31 NOTE — Progress Notes (Signed)
Subjective:    Patient ID: Martin Chase, male    DOB: 1946/05/18, 71 y.o.   MRN: [id number]  DOS:  01/31/2017 Type of visit - description : cpx Interval history: Here with his wife, they do have some concerns   Review of Systems Wife has noted that he has some difficulty getting up from chairs and needs to push him up with his arms. Despite that, they are getting more active, doing the treadmill 3 times a week and trying to take more walks. The patient denies paresthesias, focal deficits. He does have DJD and right knee pain. Continue with some urinary urgency without dysuria or frequency. Has occasional pain at the left groin. No hernia or mass. No testicular pain.   Other than above, a 14 point review of systems is negative      Past Medical History:  Diagnosis Date  . Allergy   . History of blood transfusion   . Hyperlipidemia   . Prediabetes 10/20/2014    Past Surgical History:  Procedure Laterality Date  . TONSILLECTOMY    . VASECTOMY      Social History   Social History  . Marital status: Married    Spouse name: N/A  . Number of children: 0  . Years of education: N/A   Occupational History  . retired, [employer]    Social History Main Topics  . Smoking status: Former Smoker    Years: 5.00    Types: Cigarettes    Quit date: 01/29/1986  . Smokeless tobacco: Never Used  . Alcohol use 0.0 oz/week     Comment: socially   . Drug use: No  . Sexual activity: Yes   Other Topics Concern  . Not on file   Social History Narrative   Household -- pt and wife     Family History  Problem Relation Age of Onset  . Diabetes Mother   . Alzheimer's disease Mother   . Prostate cancer Father 43  . Diabetes Maternal Uncle   . Stroke Paternal Uncle   . Diabetes Maternal Grandmother   . Heart disease Maternal Grandfather   . Colon cancer Neg Hx      Allergies as of 01/31/2017   No Known Allergies     Medication List       Accurate as of  01/31/17  5:50 PM. Always use your most recent med list.          ALLERGY PO Take 1 tablet by mouth daily.   atorvastatin 20 MG tablet Commonly known as:  LIPITOR Take 1 tablet (20 mg total) by mouth daily.   B-12 2500 MCG Tabs Take 1 tablet by mouth daily at 6 (six) AM.   Fish Oil 1200 MG Caps Take 1,200 mg by mouth daily.   multivitamin tablet Take 1 tablet by mouth daily.   OSTEO BI-FLEX ADV JOINT SHIELD PO Take by mouth 2 (two) times daily.   TURMERIC PO Take 1,000 mg by mouth daily.   Vitamin D3 2000 units Tabs Take 1 tablet by mouth daily.          Objective:   Physical Exam BP 124/78 (BP Location: Left Arm, Patient Position: Sitting, Cuff Size: Normal)   Pulse 61   Temp 98.1 F (36.7 C) (Oral)   Resp 14   Ht 5\' 11"  (1.803 m)   Wt 216 lb 4 oz (98.1 kg)   SpO2 96%   BMI 30.16 kg/m   General:   Well  developed, well nourished . NAD.  Neck: No  thyromegaly  HEENT:  Normocephalic . Face symmetric, atraumatic Lungs:  CTA B Normal respiratory effort, no intercostal retractions, no accessory muscle use. Heart: RRR,  no murmur.  No pretibial edema bilaterally  Abdomen:  Not distended, soft, non-tender. No rebound or rigidity. Groin: No hernias GU: Testicles no TTP, symmetric. prostate exam: Slightly enlarged gland but not tender or nodular.   Skin: Exposed areas without rash. Not pale. Not jaundice Neurologic:  alert & oriented X3.  Speech normal, gait appropriate for age and unassisted Strength and DTRs symmetric and appropriate for age.  Psych: Cognition and judgment appear intact.  Cooperative with normal attention span and concentration.  Behavior appropriate. No anxious or depressed appearing.    Assessment & Plan:   Assessment Prediabetes Hyperlipidemia DJD  Allergies PUD per pt, dx early 2000s, related to ASA, was told not to take   Plan:  Prediabetes: Check A1c Hyperlipidemia: Continue Lipitor, check labs LUTS: Chronic urinary   urgency. DRE showed a slightly enlarged prostate gland, check a PSA, UA urine culture Left groin pain: No evidence of a hernia, hip pain?. Rx observation for now Difficulty getting from up from chairs: Neuro exam normal, discussed PT for leg strengthening, declined for now, plans to go to the gym more regularly . He takes several OTC supplements, recommend to limit vitamins to a B12 and a multivitamin. RTC 6 months

## 2017-02-01 LAB — URINE CULTURE: Organism ID, Bacteria: NO GROWTH

## 2017-02-07 ENCOUNTER — Other Ambulatory Visit (INDEPENDENT_AMBULATORY_CARE_PROVIDER_SITE_OTHER): Payer: Medicare HMO

## 2017-02-07 DIAGNOSIS — E875 Hyperkalemia: Secondary | ICD-10-CM | POA: Diagnosis not present

## 2017-02-07 LAB — BASIC METABOLIC PANEL
BUN: 13 mg/dL (ref 6–23)
CALCIUM: 9 mg/dL (ref 8.4–10.5)
CO2: 33 meq/L — AB (ref 19–32)
CREATININE: 0.86 mg/dL (ref 0.40–1.50)
Chloride: 104 mEq/L (ref 96–112)
GFR: 92.81 mL/min (ref >60.00)
GLUCOSE: 98 mg/dL (ref 70–99)
Potassium: 4 mEq/L (ref 3.5–5.1)
Sodium: 140 mEq/L (ref 135–145)

## 2017-02-12 ENCOUNTER — Encounter: Payer: Self-pay | Admitting: Internal Medicine

## 2017-02-12 MED ORDER — ATORVASTATIN CALCIUM 20 MG PO TABS: 20.0000 mg | ORAL_TABLET | Freq: Every day | ORAL | 3 refills | Status: DC

## 2017-07-30 NOTE — Progress Notes (Addendum)
Subjective:   Martin Chase is a 71 y.o. male who presents for Medicare Annual/Subsequent preventive examination.  Review of Systems:  No ROS.  Medicare Wellness Visit. Additional risk factors are reflected in the social history.  Cardiac Risk Factors include: advanced age (>51men, >26 women);diabetes mellitus;male gender Sleep patterns: Reports he sleeps well. Takes Melatonin at night as needed. Home Safety/Smoke Alarms: Feels safe in home. Smoke alarms in place.  Seat Belt Safety/Bike Helmet: Wears seat belt.   Male:   CCS- last 08/12/08: recall 7 yrs . ORDERED TODAY PSA-  Lab Results  Component Value Date   PSA 4.19 (H) 01/31/2017   PSA 3.01 01/31/2016   PSA 1.95 10/08/2013       Objective:    Vitals: BP (!) 142/72 (BP Location: Left Arm, Patient Position: Sitting, Cuff Size: Normal)   Pulse (!) 54   Ht 5\' 11"  (1.803 m)   Wt 214 lb 12.8 oz (97.4 kg)   SpO2 96%   BMI 29.96 kg/m   Body mass index is 29.96 kg/m.  Tobacco History  Smoking Status  . Former Smoker  . Years: 5.00  . Types: Cigarettes  . Quit date: 01/29/1986  Smokeless Tobacco  . Never Used     Counseling given: Not Answered   Past Medical History:  Diagnosis Date  . Allergy   . History of blood transfusion   . Hyperlipidemia   . Prediabetes 10/20/2014   Past Surgical History:  Procedure Laterality Date  . TONSILLECTOMY    . VASECTOMY     Family History  Problem Relation Age of Onset  . Diabetes Mother   . Alzheimer's disease Mother   . Prostate cancer Father 28  . Diabetes Maternal Uncle   . Stroke Paternal Uncle   . Diabetes Maternal Grandmother   . Heart disease Maternal Grandfather   . Colon cancer Neg Hx    History  Sexual Activity  . Sexual activity: No    Outpatient Encounter Prescriptions as of 08/03/2017  Medication Sig  . atorvastatin (LIPITOR) 20 MG tablet Take 1 tablet (20 mg total) by mouth daily.  . Chlorpheniramine Maleate (ALLERGY PO) Take 1 tablet by mouth  daily.  . Cholecalciferol (VITAMIN D3) 2000 UNITS TABS Take 1 tablet by mouth daily.  . Coenzyme Q10 (COQ10 PO) Take by mouth.  . Cyanocobalamin (B-12) 2500 MCG TABS Take 1 tablet by mouth daily at 6 (six) AM.  . Misc Natural Products (OSTEO BI-FLEX ADV JOINT SHIELD PO) Take by mouth 2 (two) times daily.    . Multiple Vitamin (MULTIVITAMIN) tablet Take 1 tablet by mouth daily.    . TURMERIC PO Take 1,000 mg by mouth daily.  . [DISCONTINUED] Omega-3 Fatty Acids (FISH OIL) 1200 MG CAPS Take 1,200 mg by mouth daily.     No facility-administered encounter medications on file as of 08/03/2017.     Activities of Daily Living In your present state of health, do you have any difficulty performing the following activities: 08/03/2017 01/31/2017  Hearing? N N  Comment wearing hearing aids -  Vision? N N  Comment wearing glasses. Dr.McCuin yearly -  Difficulty concentrating or making decisions? N N  Walking or climbing stairs? N N  Dressing or bathing? N N  Doing errands, shopping? N N  Preparing Food and eating ? N -  Using the Toilet? N -  In the past six months, have you accidently leaked urine? N -  Do you have problems with loss of bowel  control? N -  Managing your Medications? N -  Managing your Finances? N -  Housekeeping or managing your Housekeeping? N -  Some recent data might be hidden    Patient Care Team: Colon Branch, MD as PCP - General (Internal Medicine) Berle Mull, MD as Consulting Physician (Sports Medicine)   Assessment:    Physical assessment deferred to PCP.  Exercise Activities and Dietary recommendations Current Exercise Habits: Home exercise routine;Structured exercise class, Type of exercise: strength training/weights;treadmill, Time (Minutes): > 60, Frequency (Times/Week): 3, Weekly Exercise (Minutes/Week): 0 Diet (meal preparation, eat out, water intake, caffeinated beverages, dairy products, fruits and vegetables): in general, a "healthy" diet  , well  balanced    Goals      Patient Stated   . Lose weight (pt-stated)          Weight today=214.8lb Goal <200lb with increasing exercise.       Fall Risk Fall Risk  08/03/2017 01/31/2017 01/31/2016 04/17/2014 10/15/2013  Falls in the past year? No No No No No   Depression Screen PHQ 2/9 Scores 08/03/2017 01/31/2017 01/31/2016 04/17/2014  PHQ - 2 Score 0 0 0 0    Cognitive Function Ad8 score reviewed for issues:  Issues making decisions:no  Less interest in hobbies / activities:no  Repeats questions, stories (family complaining):no  Trouble using ordinary gadgets (microwave, computer, phone):no  Forgets the month or year: no  Mismanaging finances: no  Remembering appts:no Daily problems with thinking and/or memory:no Ad8 score is=0        Immunization History  Administered Date(s) Administered  . Influenza Split 09/22/2011, 08/25/2014  . Influenza Whole 08/23/2007, 07/09/2009, 07/25/2010  . Influenza, High Dose Seasonal PF 08/03/2017  . Influenza-Unspecified 08/17/2015  . Pneumococcal Conjugate-13 10/20/2014  . Pneumococcal Polysaccharide-23 07/03/2008  . Td 07/23/2009  . Zoster 03/25/2011   Screening Tests Health Maintenance  Topic Date Due  . COLONOSCOPY  08/12/2015  . PNA vac Low Risk Adult (2 of 2 - PPSV23) 01/28/2019 (Originally 10/21/2015)  . TETANUS/TDAP  07/24/2019  . INFLUENZA VACCINE  Completed  . Hepatitis C Screening  Completed      Plan:   Follow up with PCP today as scheduled.  Continue to eat heart healthy diet (full of fruits, vegetables, whole grains, lean protein, water--limit salt, fat, and sugar intake) and increase physical activity as tolerated.  Continue doing brain stimulating activities (puzzles, reading, adult coloring books, staying active) to keep memory sharp.   Bring a copy of your living will and/or healthcare power of attorney to your next office visit.  I have ordered your colonoscopy. They will call you to schedule  appointment.  I have personally reviewed and noted the following in the patient's chart:   . Medical and social history . Use of alcohol, tobacco or illicit drugs  . Current medications and supplements . Functional ability and status . Nutritional status . Physical activity . Advanced directives . List of other physicians . Hospitalizations, surgeries, and ER visits in previous 12 months . Vitals . Screenings to include cognitive, depression, and falls . Referrals and appointments  In addition, I have reviewed and discussed with patient certain preventive protocols, quality metrics, and best practice recommendations. A written personalized care plan for preventive services as well as general preventive health recommendations were provided to patient.     Naaman Plummer [place], [place]  08/03/2017  [name], MD

## 2017-08-03 ENCOUNTER — Encounter: Payer: Self-pay | Admitting: Internal Medicine

## 2017-08-03 ENCOUNTER — Ambulatory Visit (INDEPENDENT_AMBULATORY_CARE_PROVIDER_SITE_OTHER): Payer: Medicare HMO | Admitting: Internal Medicine

## 2017-08-03 VITALS — BP 142/72 | HR 54 | Ht 71.0 in | Wt 214.8 lb

## 2017-08-03 DIAGNOSIS — Z1211 Encounter for screening for malignant neoplasm of colon: Secondary | ICD-10-CM

## 2017-08-03 DIAGNOSIS — Z23 Encounter for immunization: Secondary | ICD-10-CM

## 2017-08-03 DIAGNOSIS — R972 Elevated prostate specific antigen [PSA]: Secondary | ICD-10-CM

## 2017-08-03 DIAGNOSIS — E785 Hyperlipidemia, unspecified: Secondary | ICD-10-CM

## 2017-08-03 DIAGNOSIS — R252 Cramp and spasm: Secondary | ICD-10-CM

## 2017-08-03 DIAGNOSIS — Z1212 Encounter for screening for malignant neoplasm of rectum: Secondary | ICD-10-CM

## 2017-08-03 DIAGNOSIS — Z0000 Encounter for general adult medical examination without abnormal findings: Secondary | ICD-10-CM | POA: Diagnosis not present

## 2017-08-03 LAB — PSA: PSA: 4.56 ng/mL — AB (ref 0.10–4.00)

## 2017-08-03 NOTE — Patient Instructions (Addendum)
GO TO THE LAB : Get the blood work     GO TO THE FRONT DESK Schedule your next appointment for a  physical exam in 6 months, fasting    [name] , Thank you for taking time to come for your Medicare Wellness Visit. I appreciate your ongoing commitment to your health goals. Please review the following plan we discussed and let me know if I can assist you in the future.   These are the goals we discussed: Goals      Patient Stated   . Lose weight (pt-stated)          Weight today=214.8lb Goal <200lb with increasing exercise.        This is a list of the screening recommended for you and due dates:  Health Maintenance  Topic Date Due  . Colon Cancer Screening  08/12/2015  . Pneumonia vaccines (2 of 2 - PPSV23) 01/28/2019*  . Tetanus Vaccine  07/24/2019  . Flu Shot  Completed  .  Hepatitis C: One time screening is recommended by Center for Disease Control  (CDC) for  adults born from [birth year] through 1965.   Completed  *Topic was postponed. The date shown is not the original due date.   Continue to eat heart healthy diet (full of fruits, vegetables, whole grains, lean protein, water--limit salt, fat, and sugar intake) and increase physical activity as tolerated.  Continue doing brain stimulating activities (puzzles, reading, adult coloring books, staying active) to keep memory sharp.   Bring a copy of your living will and/or healthcare power of attorney to your next office visit.  I have ordered your colonoscopy. They will call you to schedule appointment.  Health Maintenance, Male A healthy lifestyle and preventive care is important for your health and wellness. Ask your health care provider about what schedule of regular examinations is right for you. What should I know about weight and diet? Eat a Healthy Diet  Eat plenty of vegetables, fruits, whole grains, low-fat dairy products, and lean protein.  Do not eat a lot of foods high in solid fats, added sugars, or  salt.  Maintain a Healthy Weight Regular exercise can help you achieve or maintain a healthy weight. You should:  Do at least 150 minutes of exercise each week. The exercise should increase your heart rate and make you sweat (moderate-intensity exercise).  Do strength-training exercises at least twice a week.  Watch Your Levels of Cholesterol and Blood Lipids  Have your blood tested for lipids and cholesterol every 5 years starting at 73 years of age. If you are at high risk for heart disease, you should start having your blood tested when you are 73 years old. You may need to have your cholesterol levels checked more often if: ? Your lipid or cholesterol levels are high. ? You are older than 73 years of age. ? You are at high risk for heart disease.  What should I know about cancer screening? Many types of cancers can be detected early and may often be prevented. Lung Cancer  You should be screened every year for lung cancer if: ? You are a current smoker who has smoked for at least 30 years. ? You are a former smoker who has quit within the past 15 years.  Talk to your health care provider about your screening options, when you should start screening, and how often you should be screened.  Colorectal Cancer  Routine colorectal cancer screening usually begins at 73 years of  age and should be repeated every 5-10 years until you are 73 years old. You may need to be screened more often if early forms of precancerous polyps or small growths are found. Your health care provider may recommend screening at an earlier age if you have risk factors for colon cancer.  Your health care provider may recommend using home test kits to check for hidden blood in the stool.  A small camera at the end of a tube can be used to examine your colon (sigmoidoscopy or colonoscopy). This checks for the earliest forms of colorectal cancer.  Prostate and Testicular Cancer  Depending on your age and overall  health, your health care provider may do certain tests to screen for prostate and testicular cancer.  Talk to your health care provider about any symptoms or concerns you have about testicular or prostate cancer.  Skin Cancer  Check your skin from head to toe regularly.  Tell your health care provider about any new moles or changes in moles, especially if: ? There is a change in a mole's size, shape, or color. ? You have a mole that is larger than a pencil eraser.  Always use sunscreen. Apply sunscreen liberally and repeat throughout the day.  Protect yourself by wearing long sleeves, pants, a wide-brimmed hat, and sunglasses when outside.  What should I know about heart disease, diabetes, and high blood pressure?  If you are 60-72 years of age, have your blood pressure checked every 3-5 years. If you are 10 years of age or older, have your blood pressure checked every year. You should have your blood pressure measured twice-once when you are at a hospital or clinic, and once when you are not at a hospital or clinic. Record the average of the two measurements. To check your blood pressure when you are not at a hospital or clinic, you can use: ? An automated blood pressure machine at a pharmacy. ? A home blood pressure monitor.  Talk to your health care provider about your target blood pressure.  If you are between 55-53 years old, ask your health care provider if you should take aspirin to prevent heart disease.  Have regular diabetes screenings by checking your fasting blood sugar level. ? If you are at a normal weight and have a low risk for diabetes, have this test once every three years after the age of 51. ? If you are overweight and have a high risk for diabetes, consider being tested at a younger age or more often.  A one-time screening for abdominal aortic aneurysm (AAA) by ultrasound is recommended for men aged 7-75 years who are current or former smokers. What should I know  about preventing infection? Hepatitis B If you have a higher risk for hepatitis B, you should be screened for this virus. Talk with your health care provider to find out if you are at risk for hepatitis B infection. Hepatitis C Blood testing is recommended for:  Everyone born from [birth year] through 1967.  Anyone with known risk factors for hepatitis C.  Sexually Transmitted Diseases (STDs)  You should be screened each year for STDs including gonorrhea and chlamydia if: ? You are sexually active and are younger than 73 years of age. ? You are older than 73 years of age and your health care provider tells you that you are at risk for this type of infection. ? Your sexual activity has changed since you were last screened and you are at an increased risk  for chlamydia or gonorrhea. Ask your health care provider if you are at risk.  Talk with your health care provider about whether you are at high risk of being infected with HIV. Your health care provider may recommend a prescription medicine to help prevent HIV infection.  What else can I do?  Schedule regular health, dental, and eye exams.  Stay current with your vaccines (immunizations).  Do not use any tobacco products, such as cigarettes, chewing tobacco, and e-cigarettes. If you need help quitting, ask your health care provider.  Limit alcohol intake to no more than 2 drinks per day. One drink equals 12 ounces of beer, 5 ounces of wine, or 1 ounces of hard liquor.  Do not use street drugs.  Do not share needles.  Ask your health care provider for help if you need support or information about quitting drugs.  Tell your health care provider if you often feel depressed.  Tell your health care provider if you have ever been abused or do not feel safe at home. This information is not intended to replace advice given to you by your health care provider. Make sure you discuss any questions you have with your health care  provider. Document Released: 04/20/2008 Document Revised: 06/21/2016 Document Reviewed: 07/27/2015 Elsevier Interactive Patient Education  [employer].

## 2017-08-03 NOTE — Assessment & Plan Note (Signed)
Patient had his Medicare wellness visit today. Was referred to GI for colonoscopy [facility] about another pneumonia shot, his last PNM 23 was several years ago, booster today. Also, he is a candidate for shingrix

## 2017-08-03 NOTE — Progress Notes (Signed)
Subjective:    Patient ID: Martin Chase, male    DOB: 1946/03/20, 71 y.o.   MRN: [id number]  DOS:  08/03/2017 Type of visit - description : rov Interval history: He is doing well. BP today is 142/72, at home is usually 120/65. Good medication compliance. Last PSA was a slightly elevated. Occasionally has leg cramps, usually at rest, at night when he watched TV. Stretching and walking helps.  BP Readings from Last 3 Encounters:  08/03/17 (!) 142/72  01/31/17 124/78  01/31/16 118/72     Review of Systems No  dysuria, gross hematuria difficulty urinating No chest pain or difficulty breathing. No lower extremity edema  Past Medical History:  Diagnosis Date  . Allergy   . History of blood transfusion   . Hyperlipidemia   . Prediabetes 10/20/2014    Past Surgical History:  Procedure Laterality Date  . TONSILLECTOMY    . VASECTOMY      Social History   Social History  . Marital status: Married    Spouse name: N/A  . Number of children: 0  . Years of education: N/A   Occupational History  . retired, cone [employer]    Social History Main Topics  . Smoking status: Former Smoker    Years: 5.00    Types: Cigarettes    Quit date: 01/29/1986  . Smokeless tobacco: Never Used  . Alcohol use 0.0 oz/week     Comment: 1 glass of wine with dinner  . Drug use: No  . Sexual activity: No   Other Topics Concern  . Not on file   Social History Narrative   Household -- pt and wife      Allergies as of 08/03/2017   No Known Allergies     Medication List       Accurate as of 08/03/17 11:59 PM. Always use your most recent med list.          ALLERGY PO Take 1 tablet by mouth daily.   atorvastatin 20 MG tablet Commonly known as:  LIPITOR Take 1 tablet (20 mg total) by mouth daily.   B-12 2500 MCG Tabs Take 1 tablet by mouth daily at 6 (six) AM.   COQ10 PO Take by mouth.   multivitamin tablet Take 1 tablet by mouth daily.   OSTEO BI-FLEX ADV  JOINT SHIELD PO Take by mouth 2 (two) times daily.   TURMERIC PO Take 1,000 mg by mouth daily.   Vitamin D3 2000 units Tabs Take 1 tablet by mouth daily.            Discharge Care Instructions        Start     Ordered   08/03/17 0000  Flu vaccine HIGH DOSE PF     08/03/17 1032   08/03/17 0000  Ambulatory referral to Gastroenterology    Question:  What is the reason for referral?  Answer:  Colonoscopy   08/03/17 1058   08/03/17 0000  PSA     08/03/17 1058   08/03/17 0000  Pneumococcal polysaccharide vaccine 23-valent greater than or equal to 2yo subcutaneous/IM     08/03/17 1059         Objective:   Physical Exam BP (!) 142/72 (BP Location: Left Arm, Patient Position: Sitting, Cuff Size: Normal)   Pulse (!) 54   Ht 5\' 11"  (1.803 m)   Wt 214 lb 12.8 oz (97.4 kg)   SpO2 96%   BMI 29.96 kg/m  General:  Well developed, well nourished . NAD.  HEENT:  Normocephalic . Face symmetric, atraumatic Lungs:  CTA B Normal respiratory effort, no intercostal retractions, no accessory muscle use. Heart: RRR,  no murmur.  No pretibial edema bilaterally  Skin: Not pale. Not jaundice Neurologic:  alert & oriented X3.  Speech normal, gait appropriate for age and unassisted Psych--  Cognition and judgment appear intact.  Cooperative with normal attention span and concentration.  Behavior appropriate. No anxious or depressed appearing.      Assessment & Plan:   Assessment Prediabetes Hyperlipidemia DJD  Allergies PUD per pt, dx early 2000s, related to ASA, was told not to take   Plan:  Hyperlipidemia: Well-controlled per last FLP Leg cramps: c/o leg cramps but denies generalized aches and pains, rec  stretching and possibly use tonic water which contains quinine Slight increased PSA: Patient is asx, DRE a few months ago showed slightly increased prostate size. Recheck PSA, plan is trending levels. RTC 6 months CPX

## 2017-08-04 NOTE — Assessment & Plan Note (Signed)
Hyperlipidemia: Well-controlled per last FLP Leg cramps: c/o leg cramps but denies generalized aches and pains, rec  stretching and possibly use tonic water which contains quinine Slight increased PSA: Patient is asx, DRE a few months ago showed slightly increased prostate size. Recheck PSA, plan is trending levels. RTC 6 months CPX

## 2017-08-20 ENCOUNTER — Telehealth: Payer: Self-pay | Admitting: Internal Medicine

## 2017-08-20 NOTE — Telephone Encounter (Signed)
Martin Chase would like to be put on list for shingle shot.

## 2017-08-20 NOTE — Telephone Encounter (Signed)
Talked with Patsy and let her know that they need to go to pharmacy.

## 2017-08-20 NOTE — Telephone Encounter (Signed)
Pt has [employer]. Medicare will not pay for Shingrix in office, Pt will need to get at pharmacy.

## 2017-08-29 DIAGNOSIS — H52203 Unspecified astigmatism, bilateral: Secondary | ICD-10-CM | POA: Diagnosis not present

## 2017-08-29 DIAGNOSIS — H2513 Age-related nuclear cataract, bilateral: Secondary | ICD-10-CM | POA: Diagnosis not present

## 2018-01-02 ENCOUNTER — Telehealth: Payer: Self-pay | Admitting: *Deleted

## 2018-01-02 LAB — HEPATIC FUNCTION PANEL
ALK PHOS: 89 (ref 25–125)
ALT: 26 (ref 10–40)
AST: 26 (ref 14–40)
BILIRUBIN, TOTAL: 0.7
Bilirubin, Direct: 0.2 (ref 0.01–0.4)

## 2018-01-02 LAB — LIPID PANEL
CHOLESTEROL: 168 (ref 0–200)
HDL: 55 (ref 35–70)
LDL Cholesterol: 100
Triglycerides: 66 (ref 40–160)

## 2018-01-02 LAB — CBC AND DIFFERENTIAL
HEMATOCRIT: 47 (ref 41–53)
HEMOGLOBIN: 15 (ref 13.5–17.5)
Neutrophils Absolute: 4
PLATELETS: 47 — AB (ref 150–399)
WBC: 7.5

## 2018-01-02 LAB — BASIC METABOLIC PANEL
BUN: 21 (ref 4–21)
CREATININE: 0.9 (ref 0.6–1.3)
Glucose: 100
Potassium: 4.5 (ref 3.4–5.3)
Sodium: 139 (ref 137–147)

## 2018-01-02 LAB — TSH: TSH: 2.68 (ref 0.41–5.90)

## 2018-01-02 LAB — PSA: PSA: 5.26

## 2018-01-02 NOTE — Telephone Encounter (Signed)
Nurse from [facility] called to verify pneumovax vaccine.

## 2018-01-29 ENCOUNTER — Other Ambulatory Visit: Payer: Self-pay | Admitting: Internal Medicine

## 2018-01-29 LAB — PSA: PSA: 5.08

## 2018-02-21 ENCOUNTER — Encounter: Payer: Medicare HMO | Admitting: Internal Medicine

## 2018-03-04 ENCOUNTER — Encounter: Payer: Self-pay | Admitting: Internal Medicine

## 2018-03-04 ENCOUNTER — Ambulatory Visit (INDEPENDENT_AMBULATORY_CARE_PROVIDER_SITE_OTHER): Payer: Medicare HMO | Admitting: Internal Medicine

## 2018-03-04 VITALS — BP 124/68 | HR 57 | Temp 97.5°F | Resp 14 | Ht 71.0 in | Wt 214.2 lb

## 2018-03-04 DIAGNOSIS — I10 Essential (primary) hypertension: Secondary | ICD-10-CM | POA: Diagnosis not present

## 2018-03-04 DIAGNOSIS — Z1211 Encounter for screening for malignant neoplasm of colon: Secondary | ICD-10-CM

## 2018-03-04 DIAGNOSIS — E785 Hyperlipidemia, unspecified: Secondary | ICD-10-CM | POA: Diagnosis not present

## 2018-03-04 DIAGNOSIS — Z0000 Encounter for general adult medical examination without abnormal findings: Secondary | ICD-10-CM

## 2018-03-04 DIAGNOSIS — R972 Elevated prostate specific antigen [PSA]: Secondary | ICD-10-CM

## 2018-03-04 NOTE — Patient Instructions (Signed)
GO TO THE LAB : Get the blood work     GO TO THE FRONT DESK Schedule your next appointment for a   routine checkup in 6 to 8 months 

## 2018-03-04 NOTE — Progress Notes (Signed)
Pre visit review using our clinic review tool, if applicable. No additional management support is needed unless otherwise documented below in the visit note. 

## 2018-03-04 NOTE — Assessment & Plan Note (Addendum)
--  Td 2010, pnm 23: 2018, Zostavax 2012. Prevnar 2015. shingrix not available --prostate cancer screening:  See comments under "increased PSA". --Colonoscopy 2009, per GI next 2019,  Referral sent  --Diet and exercise discussed --Labs:  Had extensive labs at the [place], due to check a BMP and PSA

## 2018-03-04 NOTE — Progress Notes (Signed)
Subjective:    Patient ID: Martin Chase, male    DOB: [birth year], 72 y.o.   MRN: [id number]  DOS:  03/04/2018 Type of visit - description : cpx Interval history: Since the last office he is doing well. Went to the [place] and was started on losartan.  Review of Systems  Other than above, a 14 point review of systems is negative     Past Medical History:  Diagnosis Date  . Allergy   . History of blood transfusion   . Hyperlipidemia   . Hypertension   . Prediabetes 10/20/2014    Past Surgical History:  Procedure Laterality Date  . TONSILLECTOMY    . VASECTOMY      Social History   Socioeconomic History  . Marital status: Married    Spouse name: Not on file  . Number of children: 0  . Years of education: Not on file  . Highest education level: Not on file  Occupational History  . Occupation: retired, [employer]  Social Needs  . Financial resource strain: Not on file  . Food insecurity:    Worry: Not on file    Inability: Not on file  . Transportation needs:    Medical: Not on file    Non-medical: Not on file  Tobacco Use  . Smoking status: Former Smoker    Years: 5.00    Types: Cigarettes    Last attempt to quit: 01/29/1986    Years since quitting: 32.1  . Smokeless tobacco: Never Used  Substance and Sexual Activity  . Alcohol use: Yes    Alcohol/week: 0.0 oz    Comment: 1 glass of wine with dinner  . Drug use: No  . Sexual activity: Never  Lifestyle  . Physical activity:    Days per week: Not on file    Minutes per session: Not on file  . Stress: Not on file  Relationships  . Social connections:    Talks on phone: Not on file    Gets together: Not on file    Attends religious service: Not on file    Active member of club or organization: Not on file    Attends meetings of clubs or organizations: Not on file    Relationship status: Not on file  . Intimate partner violence:    Fear of current or ex partner: Not on file    Emotionally  abused: Not on file    Physically abused: Not on file    Forced sexual activity: Not on file  Other Topics Concern  . Not on file  Social History Narrative   Household -- pt and wife     Family History  Problem Relation Age of Onset  . Diabetes Mother   . Alzheimer's disease Mother   . Prostate cancer Father 42  . Diabetes Maternal Uncle   . Stroke Paternal Uncle   . Diabetes Maternal Grandmother   . Heart disease Maternal Grandfather   . Colon cancer Neg Hx      Allergies as of 03/04/2018   No Known Allergies     Medication List        Accurate as of 03/04/18 11:59 PM. Always use your most recent med list.          ALLERGY PO Take 1 tablet by mouth daily.   atorvastatin 20 MG tablet Commonly known as:  LIPITOR Take 1 tablet (20 mg total) by mouth daily.   B-12 2500 MCG Tabs Take 1  tablet by mouth daily at 6 (six) AM.   COQ10 PO Take by mouth.   losartan 50 MG tablet Commonly known as:  COZAAR Take 50 mg by mouth daily.   multivitamin tablet Take 1 tablet by mouth daily.   OSTEO BI-FLEX ADV JOINT SHIELD PO Take by mouth 2 (two) times daily.   TURMERIC PO Take 1,000 mg by mouth daily.   Vitamin D3 2000 units Tabs Take 1 tablet by mouth daily.          Objective:   Physical Exam BP 124/68 (BP Location: Left Arm, Patient Position: Sitting, Cuff Size: Normal)   Pulse (!) 57   Temp (!) 97.5 F (36.4 C) (Oral)   Resp 14   Ht 5\' 11"  (1.803 m)   Wt 214 lb 4 oz (97.2 kg)   SpO2 97%   BMI 29.88 kg/m  General:   Well developed, well nourished . NAD.  Neck: No  thyromegaly  HEENT:  Normocephalic . Face symmetric, atraumatic Lungs:  CTA B Normal respiratory effort, no intercostal retractions, no accessory muscle use. Heart: RRR,  no murmur.  No pretibial edema bilaterally  Abdomen:  Not distended, soft, non-tender. No rebound or rigidity. Rectal: External abnormalities: none. Normal sphincter tone. No rectal masses or tenderness.  Brown  stools Prostate: Prostate gland firm and smooth, + mild enlargement with no nodularity, no tenderness, mass, asymmetry or induration Skin: Exposed areas without rash. Not pale. Not jaundice Neurologic:  alert & oriented X3.  Speech normal, gait appropriate for age and unassisted Strength symmetric and appropriate for age.  Psych: Cognition and judgment appear intact.  Cooperative with normal attention span and concentration.  Behavior appropriate. No anxious or depressed appearing.     Assessment & Plan:   Assessment Prediabetes HTN ([facility] ~ [facility]/2018) Hyperlipidemia DJD  Allergies PUD per pt, dx early 2000s, related to ASA, was told not to take  Plan:  [place] labs: 01/02/2018: Total cholesterol 168, triglycerides 66, LDL 100. PSA 5.2. CBC showed a white count of 7.4, hemoglobin of 15 and platelets of 239 BMP: Potassium 4.5, creatinine 0.8, LFTs normal TSH 2.6 01/29/2018: PSA 5.0 HTN: Went to the [place] few weeks ago, BP was around 137, was prescribed losartan, since then has no side effects, ambulatory BP 120, 130.  Check a BMP Hyperlipidemia: Well-controlled per labs 12-2017.  Continue Lipitor Increased PSA: DRE today show a slightly enlarged prostate gland, PSA at this office has been around 4, and at the [place] around 5.  We will recheck today  RTC 6-7months

## 2018-03-05 LAB — BASIC METABOLIC PANEL
BUN: 16 mg/dL (ref 6–23)
CHLORIDE: 101 meq/L (ref 96–112)
CO2: 31 meq/L (ref 19–32)
Calcium: 9.2 mg/dL (ref 8.4–10.5)
Creatinine, Ser: 0.8 mg/dL (ref 0.40–1.50)
GFR: 100.59 mL/min (ref >60.00)
GLUCOSE: 91 mg/dL (ref 70–99)
POTASSIUM: 4.5 meq/L (ref 3.5–5.1)
SODIUM: 141 meq/L (ref 135–145)

## 2018-03-05 LAB — PSA: PSA: 5.44 ng/mL — ABNORMAL HIGH (ref 0.10–4.00)

## 2018-03-05 NOTE — Assessment & Plan Note (Signed)
[facility]: 01/[facility]/2018: Total cholesterol 168, triglycerides 66, LDL 100. PSA 5.2. CBC showed a white count of 7.4, hemoglobin of 15 and platelets of 239 BMP: Potassium 4.5, creatinine 0.8, LFTs normal TSH 2.6 01/29/2018: PSA 5.0 HTN: Went to the [place] few weeks ago, BP was around 137, was prescribed losartan, since then has no side effects, ambulatory BP 120, 130.  Check a BMP Hyperlipidemia: Well-controlled per labs 12-2017.  Continue Lipitor Increased PSA: DRE today show a slightly enlarged prostate gland, PSA at this office has been around 4, and at the [place] around 5.  We will recheck today  RTC 6-57months

## 2018-03-18 ENCOUNTER — Encounter: Payer: Self-pay | Admitting: Internal Medicine

## 2018-03-18 MED ORDER — LOSARTAN POTASSIUM 50 MG PO TABS: 50.0000 mg | ORAL_TABLET | Freq: Every day | ORAL | 2 refills | Status: DC

## 2018-05-01 ENCOUNTER — Other Ambulatory Visit: Payer: Self-pay | Admitting: Internal Medicine

## 2018-06-13 ENCOUNTER — Encounter: Payer: Self-pay | Admitting: Internal Medicine

## 2018-06-14 ENCOUNTER — Encounter: Payer: Self-pay | Admitting: Family

## 2018-06-27 ENCOUNTER — Encounter: Payer: Self-pay | Admitting: Internal Medicine

## 2018-06-27 ENCOUNTER — Telehealth: Payer: Self-pay | Admitting: Gastroenterology

## 2018-06-27 NOTE — Telephone Encounter (Signed)
I last saw him in 2017. I don't see any reason based on that visit why his colonoscopy would need to be done at the hospital, unless something has changed in his medical history since I have last seen him, he can be done at the [facility]. Thanks

## 2018-06-27 NOTE — Telephone Encounter (Signed)
Dr. Havery Moros, I am just making sure patient does not need to be scheduled at the hospital. Recall was place in system to have at the hospital, but looking at last report and your office note I do not see a reason why, could be that recall was incorrectly categorized. Let me know please, thanks.

## 2018-06-28 ENCOUNTER — Encounter: Payer: Self-pay | Admitting: Gastroenterology

## 2018-06-28 NOTE — Telephone Encounter (Signed)
Please call patient and schedule his recall colonoscopy at [facility], he will also need a pre-visit. I don't see any contraindications unless something has changed, must have been put in for hospital recall by error. Thank you.

## 2018-06-28 NOTE — Telephone Encounter (Signed)
Pre Visit and Colonoscopy scheduled. °

## 2018-08-02 NOTE — Progress Notes (Addendum)
Subjective:   Martin Chase is a 72 y.o. male who presents for Medicare Annual/Subsequent preventive examination.  Pt enjoys reading. His favorite is history.  Review of Systems: No ROS.  Medicare Wellness Visit. Additional risk factors are reflected in the social history. Cardiac Risk Factors include: advanced age (>83men, >53 women);dyslipidemia;hypertension;male gender Sleep patterns: no issues Home Safety/Smoke Alarms: Feels safe in home. Smoke alarms in place. Lives with wife in 1 story with basement. 2 dogs.  Male:   [facility]-  Pt states he has appt 08/09/18.   PSA-  Lab Results  Component Value Date   PSA 5.44 (H) 03/04/2018   PSA 5.080 01/29/2018   PSA 5.260 01/02/2018       Objective:    Vitals: BP 138/80 (BP Location: Left Arm, Patient Position: Sitting, Cuff Size: Normal)   Pulse 60   Ht 5\' 11"  (1.803 m)   Wt 217 lb 12.8 oz (98.8 kg)   SpO2 98%   BMI 30.38 kg/m   Body mass index is 30.38 kg/m.   Wt Readings from Last 3 Encounters:  08/05/18 217 lb 12.8 oz (98.8 kg)  03/04/18 214 lb 4 oz (97.2 kg)  08/03/17 214 lb 12.8 oz (97.4 kg)   Temp Readings from Last 3 Encounters:  03/04/18 (!) 97.5 F (36.4 C) (Oral)  01/31/17 98.1 F (36.7 C) (Oral)  01/31/16 97.8 F (36.6 C) (Oral)   BP Readings from Last 3 Encounters:  08/05/18 138/80  03/04/18 124/68  08/03/17 (!) 142/72   Pulse Readings from Last 3 Encounters:  08/05/18 60  03/04/18 (!) 57  08/03/17 (!) 54     Advanced Directives 08/05/2018 08/03/2017  Does Patient Have a Medical Advance Directive? Yes Yes  Type of Paramedic of [place];Living will [place];Living will  Copy of [place] in Chart? No - copy requested No - copy requested    Tobacco Social History   Tobacco Use  Smoking Status Former Smoker  . Years: 5.00  . Types: Cigarettes  . Last attempt to quit: 01/29/1986  . Years since quitting: 32.5  Smokeless Tobacco Never  Used     Counseling given: Not Answered   Clinical Intake: Pain : No/denies pain     Past Medical History:  Diagnosis Date  . Allergy   . History of blood transfusion   . Hyperlipidemia   . Hypertension   . Prediabetes 10/20/2014   Past Surgical History:  Procedure Laterality Date  . TONSILLECTOMY    . VASECTOMY     Family History  Problem Relation Age of Onset  . Diabetes Mother   . Alzheimer's disease Mother   . Prostate cancer Father 36  . Diabetes Maternal Uncle   . Stroke Paternal Uncle   . Diabetes Maternal Grandmother   . Heart disease Maternal Grandfather   . Colon cancer Neg Hx    Social History   Socioeconomic History  . Marital status: Married    Spouse name: Not on file  . Number of children: 0  . Years of education: Not on file  . Highest education level: Not on file  Occupational History  . Occupation: retired, [employer]  Social Needs  . Financial resource strain: Not on file  . Food insecurity:    Worry: Not on file    Inability: Not on file  . Transportation needs:    Medical: Not on file    Non-medical: Not on file  Tobacco Use  .  Smoking status: Former Smoker    Years: 5.00    Types: Cigarettes    Last attempt to quit: 01/29/1986    Years since quitting: 32.5  . Smokeless tobacco: Never Used  Substance and Sexual Activity  . Alcohol use: Yes    Alcohol/week: 0.0 standard drinks    Comment: 1 glass of wine with dinner  . Drug use: No  . Sexual activity: Never  Lifestyle  . Physical activity:    Days per week: Not on file    Minutes per session: Not on file  . Stress: Not on file  Relationships  . Social connections:    Talks on phone: Not on file    Gets together: Not on file    Attends religious service: Not on file    Active member of club or organization: Not on file    Attends meetings of clubs or organizations: Not on file    Relationship status: Not on file  Other Topics Concern  . Not on file  Social  History Narrative   Household -- pt and wife    Outpatient Encounter Medications as of 08/05/2018  Medication Sig  . atorvastatin (LIPITOR) 20 MG tablet Take 1 tablet (20 mg total) by mouth daily.  . Chlorpheniramine Maleate (ALLERGY PO) Take 1 tablet by mouth daily.  . Cholecalciferol (VITAMIN D3) 2000 UNITS TABS Take 1 tablet by mouth daily.  . Coenzyme Q10 (COQ10 PO) Take by mouth.  . Cyanocobalamin (B-12) 2500 MCG TABS Take 1 tablet by mouth daily at 6 (six) AM.  . losartan (COZAAR) 50 MG tablet Take 1 tablet (50 mg total) by mouth daily.  . Misc Natural Products (OSTEO BI-FLEX ADV JOINT SHIELD PO) Take by mouth 2 (two) times daily.    . Multiple Vitamin (MULTIVITAMIN) tablet Take 1 tablet by mouth daily.    . TURMERIC PO Take 1,000 mg by mouth daily.   No facility-administered encounter medications on file as of 08/05/2018.     Activities of Daily Living In your present state of health, do you have any difficulty performing the following activities: 08/05/2018  Hearing? Y  Comment wears hearing aids.  Vision? N  Comment wearing glasses. Dr.McCuen yearly.  Difficulty concentrating or making decisions? N  Walking or climbing stairs? N  Dressing or bathing? N  Doing errands, shopping? N  Preparing Food and eating ? N  Using the Toilet? N  In the past six months, have you accidently leaked urine? N  Do you have problems with loss of bowel control? N  Managing your Medications? N  Managing your Finances? N  Housekeeping or managing your Housekeeping? N  Some recent data might be hidden    Patient Care Team: Colon Branch, MD as PCP - General (Internal Medicine) Berle Mull, MD as Consulting Physician (Sports Medicine)   Assessment:   This is a routine wellness examination for [employer]. Physical assessment deferred to PCP.  Exercise Activities and Dietary recommendations Current Exercise Habits: Home exercise routine;Structured exercise class, Type of exercise: strength  training/weights;walking;treadmill, Time (Minutes): 60, Frequency (Times/Week): 3, Weekly Exercise (Minutes/Week): 180, Intensity: Moderate, Exercise limited by: None identified   Diet (meal preparation, eat out, water intake, caffeinated beverages, dairy products, fruits and vegetables): 24 hr recall Breakfast: cereal or egg and meat, coffee Lunch: 1/2 sandwich, water Dinner: spinach ravioli Pt states he is trying to increase water intake.    Goals    . DIET - INCREASE WATER INTAKE    . Lose  weight (pt-stated)     Weight today=214.8lb Goal <200lb with increasing exercise.        Fall Risk Fall Risk  08/05/2018 08/03/2017 01/31/2017 01/31/2016 04/17/2014  Falls in the past year? No No No No No    Depression Screen PHQ 2/9 Scores 08/05/2018 08/03/2017 01/31/2017 01/31/2016  PHQ - 2 Score 0 0 0 0    Cognitive Function Ad8 score reviewed for issues:  Issues making decisions:no  Less interest in hobbies / activities:no  Repeats questions, stories (family complaining):no  Trouble using ordinary gadgets (microwave, computer, phone):no  Forgets the month or year: no  Mismanaging finances: no  Remembering appts:no  Daily problems with thinking and/or memory:no Ad8 score is=0         Immunization History  Administered Date(s) Administered  . Influenza Split 09/22/2011, 08/25/2014  . Influenza Whole 08/23/2007, 07/09/2009, 07/25/2010  . Influenza, High Dose Seasonal PF 08/03/2017  . Influenza-Unspecified 08/17/2015  . Pneumococcal Conjugate-13 10/20/2014  . Pneumococcal Polysaccharide-23 07/03/2008, 08/03/2017  . Td 07/23/2009  . Zoster 03/25/2011   Screening Tests Health Maintenance  Topic Date Due  . COLONOSCOPY  08/12/2015  . INFLUENZA VACCINE  06/06/2018  . TETANUS/TDAP  07/24/2019  . Hepatitis C Screening  Completed  . PNA vac Low Risk Adult  Completed       Plan:    Please schedule your next medicare wellness visit with me in 1 yr.   Continue to eat  heart healthy diet (full of fruits, vegetables, whole grains, lean protein, water--limit salt, fat, and sugar intake) and increase physical activity as tolerated.  Bring a copy of your living will and/or healthcare power of attorney to your next office visit.  Continue doing brain stimulating activities to keep memory sharp.    I have personally reviewed and noted the following in the patient's chart:   . Medical and social history . Use of alcohol, tobacco or illicit drugs  . Current medications and supplements . Functional ability and status . Nutritional status . Physical activity . Advanced directives . List of other physicians . Hospitalizations, surgeries, and ER visits in previous 12 months . Vitals . Screenings to include cognitive, depression, and falls . Referrals and appointments  In addition, I have reviewed and discussed with patient certain preventive protocols, quality metrics, and best practice recommendations. A written personalized care plan for preventive services as well as general preventive health recommendations were provided to patient.     Naaman Plummer [place], [place]  08/05/2018  [name], MD

## 2018-08-05 ENCOUNTER — Encounter: Payer: Self-pay | Admitting: *Deleted

## 2018-08-05 ENCOUNTER — Ambulatory Visit (INDEPENDENT_AMBULATORY_CARE_PROVIDER_SITE_OTHER): Payer: Medicare HMO | Admitting: *Deleted

## 2018-08-05 VITALS — BP 138/80 | HR 60 | Ht 71.0 in | Wt 217.8 lb

## 2018-08-05 DIAGNOSIS — Z23 Encounter for immunization: Secondary | ICD-10-CM | POA: Diagnosis not present

## 2018-08-05 DIAGNOSIS — Z0000 Encounter for general adult medical examination without abnormal findings: Secondary | ICD-10-CM

## 2018-08-05 NOTE — Patient Instructions (Signed)
Please schedule your next medicare wellness visit with me in 1 yr.   Continue to eat heart healthy diet (full of fruits, vegetables, whole grains, lean protein, water--limit salt, fat, and sugar intake) and increase physical activity as tolerated.  Bring a copy of your living will and/or healthcare power of attorney to your next office visit.  Continue doing brain stimulating activities to keep memory sharp.    Mr. Dallman , Thank you for taking time to come for your Medicare Wellness Visit. I appreciate your ongoing commitment to your health goals. Please review the following plan we discussed and let me know if I can assist you in the future.   These are the goals we discussed: Goals    . DIET - INCREASE WATER INTAKE    . Lose weight (pt-stated)     Weight today=214.8lb Goal <200lb with increasing exercise.        This is a list of the screening recommended for you and due dates:  Health Maintenance  Topic Date Due  . Colon Cancer Screening  08/12/2015  . Flu Shot  06/06/2018  . Tetanus Vaccine  07/24/2019  .  Hepatitis C: One time screening is recommended by Center for Disease Control  (CDC) for  adults born from [birth year] through 1965.   Completed  . Pneumonia vaccines  Completed    Health Maintenance, Male A healthy lifestyle and preventive care is important for your health and wellness. Ask your health care provider about what schedule of regular examinations is right for you. What should I know about weight and diet? Eat a Healthy Diet  Eat plenty of vegetables, fruits, whole grains, low-fat dairy products, and lean protein.  Do not eat a lot of foods high in solid fats, added sugars, or salt.  Maintain a Healthy Weight Regular exercise can help you achieve or maintain a healthy weight. You should:  Do at least 150 minutes of exercise each week. The exercise should increase your heart rate and make you sweat (moderate-intensity exercise).  Do strength-training exercises  at least twice a week.  Watch Your Levels of Cholesterol and Blood Lipids  Have your blood tested for lipids and cholesterol every 5 years starting at 74 years of age. If you are at high risk for heart disease, you should start having your blood tested when you are 74 years old. You may need to have your cholesterol levels checked more often if: ? Your lipid or cholesterol levels are high. ? You are older than 74 years of age. ? You are at high risk for heart disease.  What should I know about cancer screening? Many types of cancers can be detected early and may often be prevented. Lung Cancer  You should be screened every year for lung cancer if: ? You are a current smoker who has smoked for at least 30 years. ? You are a former smoker who has quit within the past 15 years.  Talk to your health care provider about your screening options, when you should start screening, and how often you should be screened.  Colorectal Cancer  Routine colorectal cancer screening usually begins at 74 years of age and should be repeated every 5-10 years until you are 74 years old. You may need to be screened more often if early forms of precancerous polyps or small growths are found. Your health care provider may recommend screening at an earlier age if you have risk factors for colon cancer.  Your health care provider  may recommend using home test kits to check for hidden blood in the stool.  A small camera at the end of a tube can be used to examine your colon (sigmoidoscopy or colonoscopy). This checks for the earliest forms of colorectal cancer.  Prostate and Testicular Cancer  Depending on your age and overall health, your health care provider may do certain tests to screen for prostate and testicular cancer.  Talk to your health care provider about any symptoms or concerns you have about testicular or prostate cancer.  Skin Cancer  Check your skin from head to toe regularly.  Tell your  health care provider about any new moles or changes in moles, especially if: ? There is a change in a mole's size, shape, or color. ? You have a mole that is larger than a pencil eraser.  Always use sunscreen. Apply sunscreen liberally and repeat throughout the day.  Protect yourself by wearing long sleeves, pants, a wide-brimmed hat, and sunglasses when outside.  What should I know about heart disease, diabetes, and high blood pressure?  If you are 38-87 years of age, have your blood pressure checked every 3-5 years. If you are 68 years of age or older, have your blood pressure checked every year. You should have your blood pressure measured twice-once when you are at a hospital or clinic, and once when you are not at a hospital or clinic. Record the average of the two measurements. To check your blood pressure when you are not at a hospital or clinic, you can use: ? An automated blood pressure machine at a pharmacy. ? A home blood pressure monitor.  Talk to your health care provider about your target blood pressure.  If you are between 18-61 years old, ask your health care provider if you should take aspirin to prevent heart disease.  Have regular diabetes screenings by checking your fasting blood sugar level. ? If you are at a normal weight and have a low risk for diabetes, have this test once every three years after the age of 23. ? If you are overweight and have a high risk for diabetes, consider being tested at a younger age or more often.  A one-time screening for abdominal aortic aneurysm (AAA) by ultrasound is recommended for men aged 36-75 years who are current or former smokers. What should I know about preventing infection? Hepatitis B If you have a higher risk for hepatitis B, you should be screened for this virus. Talk with your health care provider to find out if you are at risk for hepatitis B infection. Hepatitis C Blood testing is recommended for:  Everyone born from  [birth year] through 1965.  Anyone with known risk factors for hepatitis C.  Sexually Transmitted Diseases (STDs)  You should be screened each year for STDs including gonorrhea and chlamydia if: ? You are sexually active and are younger than 74 years of age. ? You are older than 74 years of age and your health care provider tells you that you are at risk for this type of infection. ? Your sexual activity has changed since you were last screened and you are at an increased risk for chlamydia or gonorrhea. Ask your health care provider if you are at risk.  Talk with your health care provider about whether you are at high risk of being infected with HIV. Your health care provider may recommend a prescription medicine to help prevent HIV infection.  What else can I do?  Schedule regular health,  dental, and eye exams.  Stay current with your vaccines (immunizations).  Do not use any tobacco products, such as cigarettes, chewing tobacco, and e-cigarettes. If you need help quitting, ask your health care provider.  Limit alcohol intake to no more than 2 drinks per day. One drink equals 12 ounces of beer, 5 ounces of wine, or 1 ounces of hard liquor.  Do not use street drugs.  Do not share needles.  Ask your health care provider for help if you need support or information about quitting drugs.  Tell your health care provider if you often feel depressed.  Tell your health care provider if you have ever been abused or do not feel safe at home. This information is not intended to replace advice given to you by your health care provider. Make sure you discuss any questions you have with your health care provider. Document Released: 04/20/2008 Document Revised: 06/21/2016 Document Reviewed: 07/27/2015 Elsevier Interactive Patient Education  [employer].

## 2018-08-09 ENCOUNTER — Encounter: Payer: Self-pay | Admitting: Gastroenterology

## 2018-08-09 ENCOUNTER — Ambulatory Visit (AMBULATORY_SURGERY_CENTER): Payer: Self-pay

## 2018-08-09 VITALS — Ht 71.0 in | Wt 218.8 lb

## 2018-08-09 DIAGNOSIS — Z1211 Encounter for screening for malignant neoplasm of colon: Secondary | ICD-10-CM

## 2018-08-09 MED ORDER — NA SULFATE-K SULFATE-MG SULF 17.5-3.13-1.6 GM/177ML PO SOLN: 1.0000 | Freq: Once | ORAL | 0 refills | Status: AC

## 2018-08-09 NOTE — Progress Notes (Signed)
Per pt, no allergies to soy or egg products.Pt not taking any weight loss meds or using  O2 at home.  Pt refused emmi video. 

## 2018-08-23 ENCOUNTER — Encounter: Payer: Self-pay | Admitting: Gastroenterology

## 2018-08-23 ENCOUNTER — Ambulatory Visit (AMBULATORY_SURGERY_CENTER): Payer: Medicare HMO | Admitting: Gastroenterology

## 2018-08-23 VITALS — BP 110/59 | HR 49 | Temp 98.2°F | Resp 15 | Ht 71.0 in | Wt 214.0 lb

## 2018-08-23 DIAGNOSIS — D125 Benign neoplasm of sigmoid colon: Secondary | ICD-10-CM | POA: Diagnosis not present

## 2018-08-23 DIAGNOSIS — D122 Benign neoplasm of ascending colon: Secondary | ICD-10-CM

## 2018-08-23 DIAGNOSIS — D120 Benign neoplasm of cecum: Secondary | ICD-10-CM | POA: Diagnosis not present

## 2018-08-23 DIAGNOSIS — D124 Benign neoplasm of descending colon: Secondary | ICD-10-CM

## 2018-08-23 DIAGNOSIS — D127 Benign neoplasm of rectosigmoid junction: Secondary | ICD-10-CM | POA: Diagnosis not present

## 2018-08-23 DIAGNOSIS — K635 Polyp of colon: Secondary | ICD-10-CM

## 2018-08-23 DIAGNOSIS — D123 Benign neoplasm of transverse colon: Secondary | ICD-10-CM | POA: Diagnosis not present

## 2018-08-23 DIAGNOSIS — Z1211 Encounter for screening for malignant neoplasm of colon: Secondary | ICD-10-CM | POA: Diagnosis not present

## 2018-08-23 DIAGNOSIS — D128 Benign neoplasm of rectum: Secondary | ICD-10-CM

## 2018-08-23 MED ORDER — SODIUM CHLORIDE 0.9 % IV SOLN: 500.0000 mL | Freq: Once | INTRAVENOUS | Status: DC

## 2018-08-23 NOTE — Progress Notes (Signed)
Called to room to assist during endoscopic procedure.  Patient ID and intended procedure confirmed with present staff. Received instructions for my participation in the procedure from the performing physician.  

## 2018-08-23 NOTE — Op Note (Signed)
[facility] Patient Name: Martin Chase Procedure Date: 08/23/2018 11:33 AM MRN: [id number] Endoscopist: Remo Lipps P. Havery Moros , MD Age: 72 Referring MD:  Date of Birth: [birth year] Gender: Male Account #: [id number] Procedure:                Colonoscopy Indications:              Screening for colorectal malignant neoplasm. Last                            colonoscopy 10 years ago was normal. Medicines:                Monitored Anesthesia Care Procedure:                Pre-Anesthesia Assessment:                           - Prior to the procedure, a History and Physical                            was performed, and patient medications and                            allergies were reviewed. The patient's tolerance of                            previous anesthesia was also reviewed. The risks                            and benefits of the procedure and the sedation                            options and risks were discussed with the patient.                            All questions were answered, and informed consent                            was obtained. Prior Anticoagulants: The patient has                            taken no previous anticoagulant or antiplatelet                            agents. ASA Grade Assessment: III - A patient with                            severe systemic disease. After reviewing the risks                            and benefits, the patient was deemed in                            satisfactory condition to undergo the procedure.  After obtaining informed consent, the colonoscope                            was passed under direct vision. Throughout the                            procedure, the patient's blood pressure, pulse, and                            oxygen saturations were monitored continuously. The                            Colonoscope was introduced through the anus and                            advanced to the  the cecum, identified by                            appendiceal orifice and ileocecal valve. The                            colonoscopy was performed without difficulty. The                            patient tolerated the procedure well. The quality                            of the bowel preparation was adequate. The                            ileocecal valve, appendiceal orifice, and rectum                            were photographed. Scope In: 11:36:58 AM Scope Out: 12:17:59 PM Scope Withdrawal Time: 0 hours 34 minutes 2 seconds  Total Procedure Duration: 0 hours 41 minutes 1 second  Findings:                 The perianal and digital rectal examinations were                            normal.                           A large polyp was found in the cecum. The polyp was                            sessile and a few cm in size. There was a ? mild                            area of central depression. Biopsies were taken of                            the depressed area with a cold forceps for  histology, polypectomy not attempted today.                           Three sessile polyps were found in the cecum. The                            polyps were 2 to 3 mm in size. These polyps were                            removed with a cold snare. Resection and retrieval                            were complete.                           Four sessile polyps were found in the ascending                            colon. The polyps were 3 to 5 mm in size. These                            polyps were removed with a cold snare. Resection                            and retrieval were complete.                           Three sessile polyps were found in the transverse                            colon. The polyps were 3 to 4 mm in size. These                            polyps were removed with a cold snare. Resection                            and retrieval were complete.                            A 5 mm polyp was found in the descending colon. The                            polyp was sessile. The polyp was removed with a                            cold snare. Resection and retrieval were complete.                           A 3 mm polyp was found in the sigmoid colon. The                            polyp was sessile. The polyp was removed with a  cold snare. Resection and retrieval were complete.                           A 3 mm polyp was found in the rectum. The polyp was                            sessile. The polyp was removed with a cold snare.                            Resection and retrieval were complete.                           A few medium-mouthed diverticula were found in the                            sigmoid colon.                           There was significant spasm in the ascending colon                            which prolonged the procedure.                           Internal hemorrhoids were found during retroflexion.                           The exam was otherwise without abnormality. Complications:            No immediate complications. Estimated blood loss:                            Minimal. Estimated Blood Loss:     Estimated blood loss was minimal. Impression:               - One large polyp in the cecum as described above.                            Biopsied. Recommend endoscopic attempt at removal                            in the hospital endoscopy suite.                           - Three 2 to 3 mm polyps in the cecum, removed with                            a cold snare. Resected and retrieved.                           - Four 3 to 5 mm polyps in the ascending colon,                            removed with a cold snare. Resected and retrieved.                           -  Three 3 to 4 mm polyps in the transverse colon,                            removed with a cold snare. Resected and retrieved.                            - One 5 mm polyp in the descending colon, removed                            with a cold snare. Resected and retrieved.                           - One 3 mm polyp in the sigmoid colon, removed with                            a cold snare. Resected and retrieved.                           - One 3 mm polyp in the rectum, removed with a cold                            snare. Resected and retrieved.                           - Diverticulosis in the sigmoid colon.                           - Significant colonic spasm.                           - Internal hemorrhoids.                           - The examination was otherwise normal. Recommendation:           - Patient has a contact number available for                            emergencies. The signs and symptoms of potential                            delayed complications were discussed with the                            patient. Return to normal activities tomorrow.                            Written discharge instructions were provided to the                            patient.                           - Resume previous diet.                           -  Continue present medications.                           - Await pathology results.                           - Repeat colonoscopy for polypectomy at the                            hospital pending pathology results, will discuss                            with patient. Remo Lipps P. Taiten Brawn, MD 08/23/2018 12:29:57 PM This report has been signed electronically.

## 2018-08-23 NOTE — Patient Instructions (Signed)
YOU HAD AN ENDOSCOPIC PROCEDURE TODAY AT [facility]:   Refer to the procedure report that was given to you for any specific questions about what was found during the examination.  If the procedure report does not answer your questions, please call your gastroenterologist to clarify.  If you requested that your care partner not be given the details of your procedure findings, then the procedure report has been included in a sealed envelope for you to review at your convenience later.  YOU SHOULD EXPECT: Some feelings of bloating in the abdomen. Passage of more gas than usual.  Walking can help get rid of the air that was put into your GI tract during the procedure and reduce the bloating. If you had a lower endoscopy (such as a colonoscopy or flexible sigmoidoscopy) you may notice spotting of blood in your stool or on the toilet paper. If you underwent a bowel prep for your procedure, you may not have a normal bowel movement for a few days.  Please Note:  You might notice some irritation and congestion in your nose or some drainage.  This is from the oxygen used during your procedure.  There is no need for concern and it should clear up in a day or so.  SYMPTOMS TO REPORT IMMEDIATELY:   Following lower endoscopy (colonoscopy or flexible sigmoidoscopy):  Excessive amounts of blood in the stool  Significant tenderness or worsening of abdominal pains  Swelling of the abdomen that is new, acute  Fever of 100F or higher   Following upper endoscopy (EGD)  Vomiting of blood or coffee ground material  New chest pain or pain under the shoulder blades  Painful or persistently difficult swallowing  New shortness of breath  Fever of 100F or higher  Black, tarry-looking stools  For urgent or emergent issues, a gastroenterologist can be reached at any hour by calling [phone].   DIET:  We do recommend a small meal at first, but then you may proceed to your regular diet.  Drink  plenty of fluids but you should avoid alcoholic beverages for 24 hours.  ACTIVITY:  You should plan to take it easy for the rest of today and you should NOT DRIVE or use heavy machinery until tomorrow (because of the sedation medicines used during the test).    FOLLOW UP: Our staff will call the number listed on your records the next business day following your procedure to check on you and address any questions or concerns that you may have regarding the information given to you following your procedure. If we do not reach you, we will leave a message.  However, if you are feeling well and you are not experiencing any problems, there is no need to return our call.  We will assume that you have returned to your regular daily activities without incident.  If any biopsies were taken you will be contacted by phone or by letter within the next 1-3 weeks.  Please call us at [phone] if you have not heard about the biopsies in 3 weeks.    SIGNATURES/CONFIDENTIALITY: You and/or your care partner have signed paperwork which will be entered into your electronic medical record.  These signatures attest to the fact that that the information above on your After Visit Summary has been reviewed and is understood.  Full responsibility of the confidentiality of this discharge information lies with you and/or your care-partner.  Polyp, diverticulosis, hemorrhoid information given.  Follow-up colonoscopy for polypectomy after  path reports are reviewed.

## 2018-08-23 NOTE — Progress Notes (Signed)
A/ox3 pleased with MAC, report to RN 

## 2018-08-26 ENCOUNTER — Telehealth: Payer: Self-pay | Admitting: *Deleted

## 2018-08-26 NOTE — Telephone Encounter (Signed)
  Follow up Call-  Call back number 08/23/2018  Post procedure Call Back phone  # [phone]  Permission to leave phone message Yes  Some recent data might be hidden     Patient questions:  Do you have a fever, pain , or abdominal swelling? No. Pain Score  0 *  Have you tolerated food without any problems? Yes.    Have you been able to return to your normal activities? Yes.    Do you have any questions about your discharge instructions: Diet   No. Medications  No. Follow up visit  No.  Do you have questions or concerns about your Care? No.  Actions: * If pain score is 4 or above: No action needed, pain <4.

## 2018-08-31 ENCOUNTER — Encounter: Payer: Self-pay | Admitting: Internal Medicine

## 2018-09-02 MED ORDER — ATORVASTATIN CALCIUM 20 MG PO TABS: 20.0000 mg | ORAL_TABLET | Freq: Every day | ORAL | 1 refills | Status: DC

## 2018-09-03 DIAGNOSIS — H52203 Unspecified astigmatism, bilateral: Secondary | ICD-10-CM | POA: Diagnosis not present

## 2018-09-03 DIAGNOSIS — H2513 Age-related nuclear cataract, bilateral: Secondary | ICD-10-CM | POA: Diagnosis not present

## 2018-09-05 ENCOUNTER — Telehealth: Payer: Self-pay

## 2018-09-05 NOTE — Telephone Encounter (Signed)
-----   Message from Yetta Flock, MD sent at 09/03/2018  7:18 PM EDT ----- That's fine, thanks Jan

## 2018-09-05 NOTE — Telephone Encounter (Signed)
Called and LM for pt that Dr. Doyne Keel availability is very limited, only date in Nov or Dec is Monday, 09-16-18 at 11:15am. I have scheduled him for that date.  He will need to come in for a previsit with a nurse to be instructed on Monday, 09-09-18 at 10:00am.

## 2018-09-06 NOTE — Telephone Encounter (Signed)
Patient has been advised of time, date, location of previsit and colonoscopy. He verbalizes understanding.

## 2018-09-09 ENCOUNTER — Ambulatory Visit (AMBULATORY_SURGERY_CENTER): Payer: Self-pay

## 2018-09-09 VITALS — Ht 71.0 in | Wt 218.2 lb

## 2018-09-09 DIAGNOSIS — Z86010 Personal history of colonic polyps: Secondary | ICD-10-CM

## 2018-09-09 MED ORDER — NA SULFATE-K SULFATE-MG SULF 17.5-3.13-1.6 GM/177ML PO SOLN: 1.0000 | Freq: Once | ORAL | 0 refills | Status: AC

## 2018-09-09 NOTE — Progress Notes (Signed)
Denies allergies to eggs or soy products. Denies complication of anesthesia or sedation. Denies use of weight loss medication. Denies use of O2.   Emmi instructions declined.    Patient states that he had to pay close to 200.00 for his prep in October, so a sample of Suprep was given to the patient. The patient was very appreciative.

## 2018-09-12 ENCOUNTER — Encounter (HOSPITAL_COMMUNITY): Payer: Self-pay | Admitting: *Deleted

## 2018-09-12 ENCOUNTER — Other Ambulatory Visit: Payer: Self-pay

## 2018-09-16 ENCOUNTER — Ambulatory Visit (HOSPITAL_COMMUNITY)
Admission: RE | Admit: 2018-09-16 | Discharge: 2018-09-16 | Disposition: A | Discharge status: Home / Self Care | Payer: Medicare HMO | Source: Ambulatory Visit | Attending: Gastroenterology | Admitting: Gastroenterology

## 2018-09-16 ENCOUNTER — Encounter (HOSPITAL_COMMUNITY): Payer: Self-pay | Admitting: Certified Registered"

## 2018-09-16 ENCOUNTER — Ambulatory Visit (HOSPITAL_COMMUNITY): Payer: Medicare HMO | Admitting: Anesthesiology

## 2018-09-16 ENCOUNTER — Encounter (HOSPITAL_COMMUNITY)
Admission: RE | Disposition: A | Discharge status: Home / Self Care | Payer: Self-pay | Source: Ambulatory Visit | Attending: Gastroenterology

## 2018-09-16 DIAGNOSIS — G4730 Sleep apnea, unspecified: Secondary | ICD-10-CM | POA: Diagnosis not present

## 2018-09-16 DIAGNOSIS — Z86010 Personal history of colonic polyps: Secondary | ICD-10-CM | POA: Diagnosis not present

## 2018-09-16 DIAGNOSIS — E785 Hyperlipidemia, unspecified: Secondary | ICD-10-CM | POA: Diagnosis not present

## 2018-09-16 DIAGNOSIS — Z1211 Encounter for screening for malignant neoplasm of colon: Secondary | ICD-10-CM | POA: Diagnosis present

## 2018-09-16 DIAGNOSIS — Z87891 Personal history of nicotine dependence: Secondary | ICD-10-CM | POA: Diagnosis not present

## 2018-09-16 DIAGNOSIS — Z79899 Other long term (current) drug therapy: Secondary | ICD-10-CM | POA: Insufficient documentation

## 2018-09-16 DIAGNOSIS — D124 Benign neoplasm of descending colon: Secondary | ICD-10-CM | POA: Insufficient documentation

## 2018-09-16 DIAGNOSIS — I10 Essential (primary) hypertension: Secondary | ICD-10-CM | POA: Diagnosis not present

## 2018-09-16 DIAGNOSIS — D125 Benign neoplasm of sigmoid colon: Secondary | ICD-10-CM

## 2018-09-16 DIAGNOSIS — D126 Benign neoplasm of colon, unspecified: Secondary | ICD-10-CM

## 2018-09-16 DIAGNOSIS — D122 Benign neoplasm of ascending colon: Secondary | ICD-10-CM | POA: Diagnosis not present

## 2018-09-16 DIAGNOSIS — D120 Benign neoplasm of cecum: Secondary | ICD-10-CM | POA: Diagnosis not present

## 2018-09-16 HISTORY — PX: COLONOSCOPY WITH PROPOFOL: SHX5780

## 2018-09-16 HISTORY — PX: SUBMUCOSAL INJECTION: SHX5543

## 2018-09-16 HISTORY — PX: POLYPECTOMY: SHX5525

## 2018-09-16 SURGERY — COLONOSCOPY WITH PROPOFOL
Anesthesia: Monitor Anesthesia Care

## 2018-09-16 MED ORDER — LIDOCAINE 2% (20 MG/ML) 5 ML SYRINGE
INTRAMUSCULAR | Status: DC | PRN
  Administered 2018-09-16: 60 mg via INTRAVENOUS

## 2018-09-16 MED ORDER — PROPOFOL 10 MG/ML IV BOLUS
INTRAVENOUS | Status: DC | PRN
  Administered 2018-09-16 (×10): 20 mg via INTRAVENOUS
  Administered 2018-09-16: 50 mg via INTRAVENOUS
  Administered 2018-09-16 (×4): 20 mg via INTRAVENOUS
  Administered 2018-09-16: 50 mg via INTRAVENOUS
  Administered 2018-09-16 (×3): 20 mg via INTRAVENOUS

## 2018-09-16 MED ORDER — PROPOFOL 10 MG/ML IV BOLUS
INTRAVENOUS | Status: AC
  Filled 2018-09-16: qty 20

## 2018-09-16 MED ORDER — LACTATED RINGERS IV SOLN
INTRAVENOUS | Status: DC
  Administered 2018-09-16 (×2): via INTRAVENOUS

## 2018-09-16 MED ORDER — ONDANSETRON HCL 4 MG/2ML IJ SOLN
INTRAMUSCULAR | Status: DC | PRN
  Administered 2018-09-16: 4 mg via INTRAVENOUS

## 2018-09-16 MED ORDER — PHENYLEPHRINE 40 MCG/ML (10ML) SYRINGE FOR IV PUSH (FOR BLOOD PRESSURE SUPPORT)
PREFILLED_SYRINGE | INTRAVENOUS | Status: DC | PRN
  Administered 2018-09-16: 120 ug via INTRAVENOUS
  Administered 2018-09-16: 200 ug via INTRAVENOUS
  Administered 2018-09-16: 120 ug via INTRAVENOUS

## 2018-09-16 SURGICAL SUPPLY — 22 items

## 2018-09-16 NOTE — Op Note (Addendum)
[facility] Patient Name: Martin Chase Procedure Date: 09/16/2018 MRN: [id number] Attending MD: Carlota Raspberry. Havery Moros , MD Date of Birth: May 06, 1946 CSN: [id number] Age: 72 Admit Type: Outpatient Procedure:                Colonoscopy Indications:              EMR for known large cecal adenoma noted on                            screening colonoscopy Providers:                Remo Lipps P. Havery Moros, MD, Cleda Daub, RN, Cherylynn Ridges, Technician, Neldon Newport CRNA, CRNA Referring MD:              Medicines:                Monitored Anesthesia Care Complications:            No immediate complications. Estimated blood loss:                            Minimal. Estimated Blood Loss:     Estimated blood loss was minimal. Procedure:                Pre-Anesthesia Assessment:                           - Prior to the procedure, a History and Physical                            was performed, and patient medications and                            allergies were reviewed. The patient's tolerance of                            previous anesthesia was also reviewed. The risks                            and benefits of the procedure and the sedation                            options and risks were discussed with the patient.                            All questions were answered, and informed consent                            was obtained. Prior Anticoagulants: The patient has                            taken no previous anticoagulant or antiplatelet  agents. ASA Grade Assessment: II - A patient with                            mild systemic disease. After reviewing the risks                            and benefits, the patient was deemed in                            satisfactory condition to undergo the procedure.                           After obtaining informed consent, the colonoscope                            was  passed under direct vision. Throughout the                            procedure, the patient's blood pressure, pulse, and                            oxygen saturations were monitored continuously. The                            CF-HQ190L ([id number]) Olympus adult colonoscope was                            introduced through the anus and advanced to the the                            cecum, identified by appendiceal orifice and                            ileocecal valve. The colonoscopy was performed                            without difficulty. The patient tolerated the                            procedure well. The quality of the bowel                            preparation was good. The ileocecal valve,                            appendiceal orifice, and rectum were photographed. Scope In: 10:13:21 AM Scope Out: 11:01:25 AM Scope Withdrawal Time: 0 hours 43 minutes 33 seconds  Total Procedure Duration: 0 hours 48 minutes 4 seconds  Findings:      The perianal and digital rectal examinations were normal.      A roughly 35 mm polyp was found in the cecum. The polyp was sessile.       Area was successfully injected with 12 mL Eleview for a lift polypectomy       and the polyp  lifted appropriately. It was too large to remove en bloc.       The polyp was removed with a piecemeal technique using a hot snare.       There was more depth to the polyp than initially appreciated, and       despite the lift with Eleview some areas were hard to grasp, and a stiff       snare was used to resect those areas. In attempt to minimize the risk       for bleeding after the polypectomy, three hemostatic clips were       successfully placed across the middle portion of the polypectomy site.      A 5 to 6 mm polyp was found in the cecum behind the IC valve in close       approximation of the large polyp, not seen on initial exam. The polyp       was sessile. The polyp was removed with a cold snare. Resection  and       retrieval were complete.      A 5 mm polyp was found in the ascending colon behind a fold. The polyp       was sessile. The polyp was removed with a cold snare. Resection and       retrieval were complete.      The exam was otherwise without abnormality although not much time was       taken to survey the rest of the colon given the length of time removing       the large polyp. Impression:               - One large polyp in the cecum, removed piecemeal                            via EMR. Clips were placed.                           - One 5 to 6 mm polyp in the cecum, removed with a                            cold snare. Resected and retrieved.                           - One 5 mm polyp in the ascending colon, removed                            with a cold snare. Resected and retrieved.                           - Time was not taken to evaluate the remainder of                            the colon given the patient's recent exam Moderate Sedation:      No moderate sedation, case performed with MAC Recommendation:           - Patient has a contact number available for  emergencies. The signs and symptoms of potential                            delayed complications were discussed with the                            patient. Return to normal activities tomorrow.                            Written discharge instructions were provided to the                            patient.                           - Resume previous diet.                           - Continue present medications.                           - Await pathology results. Anticipate repeat                            colonoscopy in 3-6 months for surveillance.                           - No ibuprofen, naproxen, or other non-steroidal                            anti-inflammatory drugs for 2 weeks after polyp                            removal. Procedure Code(s):        --- Professional ---                            [phone], Colonoscopy, flexible; with removal of                            tumor(s), polyp(s), or other lesion(s) by snare                            technique                           [id number], Colonoscopy, flexible; with directed                            submucosal injection(s), any substance Diagnosis Code(s):        --- Professional ---                           D12.0, Benign neoplasm of cecum                           D12.2, Benign neoplasm of ascending colon  K63.5, Polyp of colon CPT copyright 2018 [employer]. All rights reserved. The codes documented in this report are preliminary and upon coder review may  be revised to meet current compliance requirements. Remo Lipps P. Faye Sanfilippo, MD 09/16/2018 11:19:56 AM This report has been signed electronically. Number of Addenda: 0

## 2018-09-16 NOTE — Discharge Instructions (Signed)

## 2018-09-16 NOTE — Anesthesia Preprocedure Evaluation (Addendum)
Anesthesia Evaluation  Patient identified by MRN, date of birth, ID band Patient awake    Reviewed: Allergy & Precautions, NPO status , Patient's Chart, lab work & pertinent test results  History of Anesthesia Complications Negative for: history of anesthetic complications  Airway Mallampati: II  TM Distance: >3 FB Neck ROM: Full    Dental no notable dental hx. (+) Dental Advisory Given   Pulmonary sleep apnea and Continuous Positive Airway Pressure Ventilation , former smoker,    Pulmonary exam normal        Cardiovascular hypertension, Normal cardiovascular exam     Neuro/Psych negative neurological ROS  negative psych ROS   GI/Hepatic negative GI ROS, Neg liver ROS,   Endo/Other  negative endocrine ROS  Renal/GU negative Renal ROS     Musculoskeletal   Abdominal   Peds  Hematology   Anesthesia Other Findings   Reproductive/Obstetrics                            Anesthesia Physical Anesthesia Plan  ASA: II  Anesthesia Plan: MAC   Post-op Pain Management:    Induction:   PONV Risk Score and Plan: 2 and Ondansetron and Propofol infusion  Airway Management Planned: Natural Airway and Simple Face Mask  Additional Equipment:   Intra-op Plan:   Post-operative Plan:   Informed Consent: I have reviewed the patients History and Physical, chart, labs and discussed the procedure including the risks, benefits and alternatives for the proposed anesthesia with the patient or authorized representative who has indicated his/her understanding and acceptance.   Dental advisory given  Plan Discussed with: CRNA and Anesthesiologist  Anesthesia Plan Comments:         Anesthesia Quick Evaluation

## 2018-09-16 NOTE — H&P (Signed)
HPI:   Martin Chase is a 72 y.o. male with a history of multiple colon polyps, including large cecal polyp noted on recent colonoscopy which was not removed. Biopsies show tubular adenoma without dysplasia. Here for colonoscopy with EMR today. He denies any complaints.  Past Medical History:  Diagnosis Date  . Allergy   . Anemia 1996   due to a bleeding from aspirin  . Cataract   . History of blood transfusion 1996  . HOH (hard of hearing)    uses hearing aids/ hearing on left side worse  . Hyperlipidemia   . Hypertension   . Prediabetes 10/20/2014  . Sleep apnea    uses c-pap    Past Surgical History:  Procedure Laterality Date  . COLONOSCOPY    . TONSILLECTOMY    . UPPER GASTROINTESTINAL ENDOSCOPY    . VASECTOMY      Family History  Problem Relation Age of Onset  . Diabetes Mother   . Alzheimer's disease Mother   . Prostate cancer Father 36  . Diabetes Maternal Uncle   . Stroke Paternal Uncle   . Diabetes Maternal Grandmother   . Heart disease Maternal Grandfather   . Suicidality Brother   . Colon cancer Neg Hx   . Rectal cancer Neg Hx   . Esophageal cancer Neg Hx   . Stomach cancer Neg Hx      Social History   Tobacco Use  . Smoking status: Former Smoker    Years: 5.00    Types: Cigarettes    Last attempt to quit: 01/29/1986    Years since quitting: 32.6  . Smokeless tobacco: Never Used  Substance Use Topics  . Alcohol use: Yes    Alcohol/week: 7.0 standard drinks    Types: 7 Glasses of wine per week    Comment: 1 glass of wine with dinner  . Drug use: No    Prior to Admission medications   Medication Sig Start Date End Date Taking? Authorizing Provider  atorvastatin (LIPITOR) 20 MG tablet Take 1 tablet (20 mg total) by mouth daily. 09/02/18  Yes Paz, Alda Berthold, MD  cetirizine (ZYRTEC) 10 MG tablet Take 10 mg by mouth daily. Aller-Tec   Yes [provider]  Cholecalciferol (VITAMIN D3) 2000 UNITS TABS Take 2,000 Units by mouth  daily.    Yes [provider]  Cyanocobalamin (VITAMIN B-12) 5000 MCG TBDP Take 5,000 mcg by mouth daily.   Yes [provider]  losartan (COZAAR) 50 MG tablet Take 1 tablet (50 mg total) by mouth daily. 03/18/18  Yes Paz, Alda Berthold, MD  Misc Natural Products (OSTEO BI-FLEX ADV TRIPLE ST PO) Take 1 tablet by mouth daily.   Yes [provider]  Multiple Vitamin (MULTIVITAMIN WITH MINERALS) TABS tablet Take 1 tablet by mouth daily. Centrum   Yes [provider]  vitamin E 400 UNIT capsule Take 800 Units by mouth daily.   Yes [provider]    Current Facility-Administered Medications  Medication Dose Route Frequency Provider Last Rate Last Dose  . lactated ringers infusion   Intravenous Continuous Armbruster, Carlota Raspberry, MD        Allergies as of 09/03/2018  . (No Known Allergies)     Review of Systems:    As per HPI, otherwise negative    Physical Exam:  Vital signs in last 24 hours: Temp:  [98 F (36.7 C)] 98 F (36.7 C) (11/11  0901) Pulse Rate:  [63] 63 (11/11 0901) Resp:  [9] 9 (11/11 0901) BP: (153)/(58) 153/58 (11/11 0901) SpO2:  [100 %] 100 % (11/11 0901)   General:   Pleasant male in NAD Lungs:  Respirations even and unlabored. Lungs clear to auscultation bilaterally.    Heart:  Regular rate and rhythm; no MRG Abdomen:  Soft, nondistended, nontender.  No appreciable masses or hepatomegaly.  Msk:  Symmetrical without gross deformities.  Extremities:  Without edema. Neurologic:  Alert and  oriented x4;  grossly normal neurologically.      Impression / Plan:  71 y/o male here for EMR of large cecal adenoma. I have discussed risks / benefits with him to include increased risks for bleeding / perforation given the location of it. He wishes to proceed with colonoscopy after discussion of options, hopes to avoid surgery if at all possible. He agrees with the plan, further recommendations pending the results.  [place] Cellar,  MD [facility]

## 2018-09-16 NOTE — Anesthesia Procedure Notes (Signed)
Procedure Name: MAC Date/Time: 09/16/2018 10:05 AM Performed by: Cynda Familia, CRNA Pre-anesthesia Checklist: Patient identified, Emergency Drugs available, Suction available, Patient being monitored and Timeout performed Patient Re-evaluated:Patient Re-evaluated prior to induction Oxygen Delivery Method: Simple face mask Placement Confirmation: positive ETCO2 and breath sounds checked- equal and bilateral Dental Injury: Teeth and Oropharynx as per pre-operative assessment

## 2018-09-16 NOTE — Interval H&P Note (Signed)
History and Physical Interval Note:  09/16/2018 9:54 AM  Martin Chase  has presented today for surgery, with the diagnosis of large colon polyp on last exam/jmh  The various methods of treatment have been discussed with the patient and family. After consideration of risks, benefits and other options for treatment, the patient has consented to  Procedure(s): COLONOSCOPY WITH PROPOFOL (N/A) as a surgical intervention .  The patient's history has been reviewed, patient examined, no change in status, stable for surgery.  I have reviewed the patient's chart and labs.  Questions were answered to the patient's satisfaction.     [place]

## 2018-09-16 NOTE — Anesthesia Postprocedure Evaluation (Signed)
Anesthesia Post Note  Patient: Martin Chase  Procedure(s) Performed: COLONOSCOPY WITH PROPOFOL (N/A ) POLYPECTOMY SUBMUCOSAL INJECTION HEMOSTASIS CLIP PLACEMENT     Patient location during evaluation: PACU Anesthesia Type: MAC Level of consciousness: awake and alert Pain management: pain level controlled Vital Signs Assessment: post-procedure vital signs reviewed and stable Respiratory status: spontaneous breathing and respiratory function stable Cardiovascular status: stable Postop Assessment: no apparent nausea or vomiting Anesthetic complications: no    Last Vitals:  Vitals:   09/16/18 1109 09/16/18 1120  BP: (!) 117/50 (!) 136/59  Pulse: (!) 50 (!) 49  Resp: 17 13  Temp: 36.5 C   SpO2: 99% 100%    Last Pain:  Vitals:   09/16/18 1109  TempSrc: Oral  PainSc:                  Jilliane Kazanjian DANIEL

## 2018-09-16 NOTE — Transfer of Care (Signed)
Immediate Anesthesia Transfer of Care Note  Patient: Martin Chase  Procedure(s) Performed: COLONOSCOPY WITH PROPOFOL (N/A ) POLYPECTOMY SUBMUCOSAL INJECTION HEMOSTASIS CLIP PLACEMENT  Patient Location: PACU and Endoscopy Unit  Anesthesia Type:MAC  Level of Consciousness: sedated  Airway & Oxygen Therapy: Patient Spontanous Breathing and Patient connected to face mask oxygen  Post-op Assessment: Report given to RN and Post -op Vital signs reviewed and stable  Post vital signs: Reviewed and stable  Last Vitals:  Vitals Value Taken Time  BP    Temp    Pulse 50 09/16/2018 11:08 AM  Resp 17 09/16/2018 11:08 AM  SpO2 89 % 09/16/2018 11:08 AM  Vitals shown include unvalidated device data.  Last Pain:  Vitals:   09/16/18 0901  TempSrc: Oral  PainSc: 0-No pain         Complications: No apparent anesthesia complications

## 2018-09-17 ENCOUNTER — Encounter (HOSPITAL_COMMUNITY): Payer: Self-pay | Admitting: Gastroenterology

## 2018-09-19 ENCOUNTER — Ambulatory Visit (INDEPENDENT_AMBULATORY_CARE_PROVIDER_SITE_OTHER): Payer: Medicare HMO | Admitting: Internal Medicine

## 2018-09-19 ENCOUNTER — Encounter: Payer: Self-pay | Admitting: Internal Medicine

## 2018-09-19 VITALS — BP 108/64 | HR 54 | Temp 98.4°F | Resp 16 | Ht 71.0 in | Wt 215.4 lb

## 2018-09-19 DIAGNOSIS — R739 Hyperglycemia, unspecified: Secondary | ICD-10-CM | POA: Diagnosis not present

## 2018-09-19 DIAGNOSIS — R972 Elevated prostate specific antigen [PSA]: Secondary | ICD-10-CM | POA: Diagnosis not present

## 2018-09-19 DIAGNOSIS — I10 Essential (primary) hypertension: Secondary | ICD-10-CM

## 2018-09-19 LAB — BASIC METABOLIC PANEL
BUN: 14 mg/dL (ref 6–23)
CHLORIDE: 103 meq/L (ref 96–112)
CO2: 31 meq/L (ref 19–32)
Calcium: 9.4 mg/dL (ref 8.4–10.5)
Creatinine, Ser: 0.9 mg/dL (ref 0.40–1.50)
GFR: 87.68 mL/min (ref >60.00)
GLUCOSE: 99 mg/dL (ref 70–99)
Potassium: 4.8 mEq/L (ref 3.5–5.1)
Sodium: 143 mEq/L (ref 135–145)

## 2018-09-19 LAB — PSA: PSA: 5.91 ng/mL — AB (ref 0.10–4.00)

## 2018-09-19 LAB — HEMOGLOBIN A1C: Hgb A1c MFr Bld: 6.1 % (ref 4.6–6.5)

## 2018-09-19 MED ORDER — ZOSTER VAC RECOMB ADJUVANTED 50 MCG/0.5ML IM SUSR: 0.5000 mL | Freq: Once | INTRAMUSCULAR | 1 refills | Status: AC

## 2018-09-19 NOTE — Patient Instructions (Signed)
GO TO THE LAB : Get the blood work     GO TO THE FRONT DESK Schedule your next appointment for a  Check up in 6-8 months

## 2018-09-19 NOTE — Progress Notes (Signed)
Subjective:    Patient ID: Martin Chase, male    DOB: 03/20/1946, 72 y.o.   MRN: [id number]  DOS:  09/19/2018 Type of visit - description : rov Interval history: HTN: Good med compliance, ambulatory BPs when checked normal Increased PSA: Reports no LUTS Prediabetes: Due for A1c   Review of Systems No dysuria or gross hematuria.  No difficulty urinating  Past Medical History:  Diagnosis Date  . Allergy   . Anemia 1996   due to a bleeding from aspirin  . Cataract   . History of blood transfusion 1996  . HOH (hard of hearing)    uses hearing aids/ hearing on left side worse  . Hyperlipidemia   . Hypertension   . Prediabetes 10/20/2014  . Sleep apnea    uses c-pap    Past Surgical History:  Procedure Laterality Date  . COLONOSCOPY WITH PROPOFOL N/A 09/16/2018   Procedure: COLONOSCOPY WITH PROPOFOL;  Surgeon: Yetta Flock, MD;  Location: [facility];  Service: Gastroenterology;  Laterality: N/A;  . POLYPECTOMY  09/16/2018   Procedure: POLYPECTOMY;  Surgeon: Yetta Flock, MD;  Location: [facility];  Service: Gastroenterology;;  . Lia Foyer INJECTION  09/16/2018   Procedure: SUBMUCOSAL INJECTION;  Surgeon: Yetta Flock, MD;  Location: [facility];  Service: Gastroenterology;;  . TONSILLECTOMY    . VASECTOMY      Social History   Socioeconomic History  . Marital status: Married    Spouse name: Not on file  . Number of children: 0  . Years of education: Not on file  . Highest education level: Not on file  Occupational History  . Occupation: retired, [employer]  Social Needs  . Financial resource strain: Not on file  . Food insecurity:    Worry: Not on file    Inability: Not on file  . Transportation needs:    Medical: Not on file    Non-medical: Not on file  Tobacco Use  . Smoking status: Former Smoker    Years: 5.00    Types: Cigarettes    Last attempt to quit: 01/29/1986    Years since quitting: 32.6  .  Smokeless tobacco: Never Used  Substance and Sexual Activity  . Alcohol use: Yes    Alcohol/week: 7.0 standard drinks    Types: 7 Glasses of wine per week    Comment: 1 glass of wine with dinner  . Drug use: No  . Sexual activity: Not Currently  Lifestyle  . Physical activity:    Days per week: Not on file    Minutes per session: Not on file  . Stress: Not on file  Relationships  . Social connections:    Talks on phone: Not on file    Gets together: Not on file    Attends religious service: Not on file    Active member of club or organization: Not on file    Attends meetings of clubs or organizations: Not on file    Relationship status: Not on file  . Intimate partner violence:    Fear of current or ex partner: Not on file    Emotionally abused: Not on file    Physically abused: Not on file    Forced sexual activity: Not on file  Other Topics Concern  . Not on file  Social History Narrative   Household -- pt and wife      Allergies as of 09/19/2018   No Known Allergies     Medication List  Accurate as of 09/19/18 11:59 PM. Always use your most recent med list.          atorvastatin 20 MG tablet Commonly known as:  LIPITOR Take 1 tablet (20 mg total) by mouth daily.   cetirizine 10 MG tablet Commonly known as:  ZYRTEC Take 10 mg by mouth daily. Aller-Tec   losartan 50 MG tablet Commonly known as:  COZAAR Take 1 tablet (50 mg total) by mouth daily.   multivitamin with minerals Tabs tablet Take 1 tablet by mouth daily. Centrum   OSTEO BI-FLEX ADV TRIPLE ST PO Take 1 tablet by mouth daily.   Vitamin B-12 5000 MCG Tbdp Take 5,000 mcg by mouth daily.   Vitamin D3 50 MCG (2000 UT) Tabs Take 2,000 Units by mouth daily.   vitamin E 400 UNIT capsule Take 800 Units by mouth daily.   Zoster Vaccine Adjuvanted injection Commonly known as:  SHINGRIX Inject 0.5 mLs into the muscle once for 1 dose.          Objective:   Physical Exam BP 108/64  (BP Location: Left Arm, Patient Position: Sitting, Cuff Size: Small)   Pulse (!) 54   Temp 98.4 F (36.9 C) (Oral)   Resp 16   Ht 5\' 11"  (1.803 m)   Wt 215 lb 6 oz (97.7 kg)   SpO2 96%   BMI 30.04 kg/m  General:   Well developed, NAD, BMI noted. HEENT:  Normocephalic . Face symmetric, atraumatic Lungs:  CTA B Normal respiratory effort, no intercostal retractions, no accessory muscle use. Heart: RRR,  no murmur.  No pretibial edema bilaterally  Skin: Not pale. Not jaundice Neurologic:  alert & oriented X3.  Speech normal, gait appropriate for age and unassisted Psych--  Cognition and judgment appear intact.  Cooperative with normal attention span and concentration.  Behavior appropriate. No anxious or depressed appearing.      Assessment & Plan:    Assessment Prediabetes HTN (dx @ [facility] ~ 01/2018) Hyperlipidemia DJD  Allergies PUD per pt, dx early 2000s, related to ASA, was told not to take  Plan:  Prediabetes: Check a A1c. HTN: Continue losartan, check a BMP Hyperlipidemia: Controlled on Lipitor Increased PSA: Asymptomatic, DRE 02/2018 show a slightly increased prostate size.  Recheck a PSA, strongly consider refer to urology. Preventive care: Shingrix discussed.  Prescription printed. RTC 6 to 8 months

## 2018-09-19 NOTE — Progress Notes (Signed)
Pre visit review using our clinic review tool, if applicable. No additional management support is needed unless otherwise documented below in the visit note. 

## 2018-09-22 NOTE — Assessment & Plan Note (Signed)
Prediabetes: Check a A1c. HTN: Continue losartan, check a BMP Hyperlipidemia: Controlled on Lipitor Increased PSA: Asymptomatic, DRE 02/2018 show a slightly increased prostate size.  Recheck a PSA, strongly consider refer to urology. Preventive care: Shingrix discussed.  Prescription printed. RTC 6 to 8 months

## 2018-11-11 DIAGNOSIS — R350 Frequency of micturition: Secondary | ICD-10-CM | POA: Diagnosis not present

## 2018-11-11 LAB — PSA: PSA: 5.76

## 2018-12-16 ENCOUNTER — Telehealth: Payer: Self-pay

## 2018-12-16 MED ORDER — ATORVASTATIN CALCIUM 20 MG PO TABS: 20.0000 mg | ORAL_TABLET | Freq: Every day | ORAL | 3 refills | Status: DC

## 2018-12-16 MED ORDER — LOSARTAN POTASSIUM 50 MG PO TABS: 50.0000 mg | ORAL_TABLET | Freq: Every day | ORAL | 3 refills | Status: DC

## 2018-12-16 NOTE — Telephone Encounter (Signed)
error 

## 2018-12-16 NOTE — Telephone Encounter (Signed)
Received MyChart message from Pt's wife, all meds need to be sent to [facility].

## 2018-12-30 ENCOUNTER — Encounter: Payer: Self-pay | Admitting: Internal Medicine

## 2018-12-30 DIAGNOSIS — G4733 Obstructive sleep apnea (adult) (pediatric): Secondary | ICD-10-CM

## 2018-12-31 ENCOUNTER — Other Ambulatory Visit (HOSPITAL_COMMUNITY): Payer: Self-pay | Admitting: Urology

## 2018-12-31 ENCOUNTER — Other Ambulatory Visit: Payer: Self-pay | Admitting: Urology

## 2018-12-31 DIAGNOSIS — C61 Malignant neoplasm of prostate: Secondary | ICD-10-CM

## 2019-01-06 ENCOUNTER — Encounter: Payer: Self-pay | Admitting: Internal Medicine

## 2019-01-06 DIAGNOSIS — C61 Malignant neoplasm of prostate: Secondary | ICD-10-CM | POA: Insufficient documentation

## 2019-01-06 DIAGNOSIS — Z8546 Personal history of malignant neoplasm of prostate: Secondary | ICD-10-CM | POA: Insufficient documentation

## 2019-01-09 ENCOUNTER — Encounter (HOSPITAL_COMMUNITY)
Admission: RE | Admit: 2019-01-09 | Discharge: 2019-01-09 | Disposition: A | Discharge status: Home / Self Care | Payer: Medicare Other | Source: Ambulatory Visit | Attending: Urology | Admitting: Urology

## 2019-01-09 ENCOUNTER — Ambulatory Visit (HOSPITAL_COMMUNITY)
Admission: RE | Admit: 2019-01-09 | Discharge: 2019-01-09 | Disposition: A | Discharge status: Home / Self Care | Payer: Medicare Other | Source: Ambulatory Visit | Attending: Urology | Admitting: Urology

## 2019-01-09 DIAGNOSIS — C61 Malignant neoplasm of prostate: Secondary | ICD-10-CM

## 2019-01-09 MED ORDER — TECHNETIUM TC 99M MEDRONATE IV KIT
20.0000 | PACK | Freq: Once | INTRAVENOUS | Status: AC | PRN
  Administered 2019-01-09: 20 via INTRAVENOUS

## 2019-01-10 ENCOUNTER — Telehealth: Payer: Self-pay | Admitting: Medical Oncology

## 2019-01-10 NOTE — Telephone Encounter (Signed)
I called pt and his wife  to introduce myself as the Prostate Nurse Navigator and the Coordinator of the Prostate [place].  1. I confirmed with the patient he is aware of his referral to the clinic 01/17/19 arriving at 8:00 am.  2. I discussed the format of the clinic and the physicians he will be seeing that day.  3. I discussed where the clinic is located and how to contact me.  4. I confirmed his address and informed him I would be mailing a packet of information and forms to be completed. I asked him to bring them with him the day of his appointment.   He voiced understanding of the above. I asked him to call me if he has any questions or concerns regarding his appointments or the forms he needs to complete.

## 2019-01-13 ENCOUNTER — Encounter: Payer: Self-pay | Admitting: Medical Oncology

## 2019-01-16 ENCOUNTER — Telehealth: Payer: Self-pay | Admitting: Medical Oncology

## 2019-01-16 ENCOUNTER — Encounter: Payer: Self-pay | Admitting: Radiation Oncology

## 2019-01-16 NOTE — Progress Notes (Signed)
GU Location of Tumor / Histology: prostatic adenocarcinoma  If Prostate Cancer, Gleason Score is (4 + 4) and PSA is (5.76) on 11/11/2018. Prostate volume: 35 grams  [name] was referred by Dr. [name] to Dr. Junious Silk in January 2020 for further evaluation of an elevated PSA. Father with hx of prostate ca.  Biopsies of prostate (if applicable) revealed:    Past/Anticipated interventions by urology, if any: prostate biopsy, CT abd/pelvis, bone scan (L3???), referral to [facility]  Past/Anticipated interventions by medical oncology, if any: no  Weight changes, if any: no  Bowel/Bladder complaints, if any: IPSS 13. SHIM 13.   Nausea/Vomiting, if any: no  Pain issues, if any:   SAFETY ISSUES:  Prior radiation?   Pacemaker/ICD? no  Possible current pregnancy? no, male patient  Is the patient on methotrexate? no  Current Complaints / other details:  73 year old male. Married. Former smoker quit in 1995.

## 2019-01-16 NOTE — Telephone Encounter (Signed)
Confirmed PMDC appointment 3/13 arriving at 8 am. I reviewed location, valet parking, registration and reminded him to bring completed medical forms. He voiced understanding.

## 2019-01-17 ENCOUNTER — Inpatient Hospital Stay: Payer: Medicare Other | Attending: Oncology | Admitting: Oncology

## 2019-01-17 ENCOUNTER — Encounter: Payer: Self-pay | Admitting: Radiation Oncology

## 2019-01-17 ENCOUNTER — Other Ambulatory Visit: Payer: Self-pay

## 2019-01-17 ENCOUNTER — Telehealth: Payer: Self-pay | Admitting: Oncology

## 2019-01-17 ENCOUNTER — Ambulatory Visit
Admission: RE | Admit: 2019-01-17 | Discharge: 2019-01-17 | Disposition: A | Discharge status: Home / Self Care | Payer: Medicare Other | Source: Ambulatory Visit | Attending: Radiation Oncology | Admitting: Radiation Oncology

## 2019-01-17 ENCOUNTER — Encounter: Payer: Self-pay | Admitting: Medical Oncology

## 2019-01-17 VITALS — BP 135/56 | HR 72 | Temp 98.1°F | Resp 20 | Ht 71.0 in | Wt 219.8 lb

## 2019-01-17 DIAGNOSIS — Z87891 Personal history of nicotine dependence: Secondary | ICD-10-CM

## 2019-01-17 DIAGNOSIS — Z79899 Other long term (current) drug therapy: Secondary | ICD-10-CM | POA: Diagnosis not present

## 2019-01-17 DIAGNOSIS — Z8042 Family history of malignant neoplasm of prostate: Secondary | ICD-10-CM | POA: Diagnosis not present

## 2019-01-17 DIAGNOSIS — G4730 Sleep apnea, unspecified: Secondary | ICD-10-CM | POA: Diagnosis not present

## 2019-01-17 DIAGNOSIS — C61 Malignant neoplasm of prostate: Secondary | ICD-10-CM

## 2019-01-17 DIAGNOSIS — E785 Hyperlipidemia, unspecified: Secondary | ICD-10-CM | POA: Insufficient documentation

## 2019-01-17 DIAGNOSIS — I10 Essential (primary) hypertension: Secondary | ICD-10-CM | POA: Insufficient documentation

## 2019-01-17 NOTE — Consult Note (Signed)
Multi-Disciplinary Clinic 01/17/2019    Martin Chase         MRN: [id number]  PRIMARY CARE:    DOB: [birth year], 73 year old Male  REFERRING:  [name], MD  SSN:   PROVIDER:  Festus Aloe, M.D.    TREATING:  Raynelle Bring, M.D.    LOCATION:  [facility] [phone]    CC/HPI: CC: Prostate Cancer   Physician requesting consult: Dr. Eda Keys  PCP: Dr. [name]  Location of consult: [facility] - Prostate Cancer Multidisciplinary Clinic   Martin Chase is a 73 year old gentleman with personal medical history of hypertension, sleep apnea, and hyperlipidemia and a paternal family history of prostate cancer who was found to have an elevated PSA of 5.76 prompting a TRUS biopsy of the prostate on 12/25/18. This demonstrated Gleason 4+4=8 adenocarcinoma of the prostate with 4 out of 12 biopsy cores positive for malignancy.   Family history: Father.   Imaging studies:  CT abd/pelvis (01/09/19): Negative for metastatic disease. He does have a small sclerotic lesion of the sacrum.  Bone scan (01/09/19): Questionable uptake at L3 vertebrae. There is no uptake at the sacrum to correlate to his CT findings.   PMH: He has a history of sleep apnea, hypertension, and hyperlipidemia.  PSH: No abdominal surgeries.   TNM stage: cT1c N0 M0-1  PSA: 5.76  Gleason score: 4+4=8  Biopsy (12/25/18): 4/12 cores positive  Left: Beign  Right: R apex (60%, 4+4=8), R lateral apex (50%, 4+4=8), R mid (30%, 4+3=7), R lateral mid (30%, 4+4=8)  Prostate volume: 34.8 cc   Nomogram  OC disease: 30%  EPE: 67%  SVI: 14%  LNI: 13%  PFS (5 year, 10 year): 53%, 37%   Urinary function: IPSS is 13.  Erectile function: SHIM score is 13. He has been unable to get adequate erections even with PDE 5 inhibitors.     ALLERGIES: None   MEDICATIONS: Aller-Tec 10 mg tablet  Atorvastatin Calcium 20 mg tablet  Centrum Men  Curcumin  Losartan Potassium 50 mg tablet  Vitamin B-12  Vitamin  C  Vitamin D3  Vitamin E     GU PSH: Locm 300-399Mg /Ml Iodine,1Ml - 01/09/2019 Prostate Needle Biopsy - 12/25/2018    NON-GU PSH: Surgical Pathology, Gross And Microscopic Examination For Prostate Needle - 12/25/2018 Tonsillectomy..        GU PMH: Prostate Cancer - 12/30/2018 Elevated PSA - 12/25/2018, - 11/11/2018 BPH w/LUTS - 11/11/2018 Urinary Frequency - 11/11/2018    NON-GU PMH: Acute gastric ulcer with hemorrhage Hypercholesterolemia Sleep Apnea    FAMILY HISTORY: Alzheimer's Disease - Mother Death of family member - Father, Mother Prostate Cancer - Father   SOCIAL HISTORY: Marital Status: Married Preferred Language: English; Ethnicity: Not Hispanic Or Latino; Race: White Current Smoking Status: Patient does not smoke anymore. Has not smoked since 11/06/1993. Smoked for 5 years. Smoked 1/2 pack per day.  <DIV'  Tobacco Use Assessment Completed:  Used Tobacco in last 30 days?   Does not use smokeless tobacco. Drinks 1 drink per day. Types of alcohol consumed: Wine.  Drinks 2 caffeinated drinks per day. Patient's occupation is/was retired.    REVIEW OF SYSTEMS:    GU Review Male:  Patient denies frequent urination, hard to postpone urination, burning/ pain with urination, get up at night to urinate, leakage of urine, stream starts and stops, trouble starting your streams, and have to strain to urinate .  Gastrointestinal (Lower):  Patient denies diarrhea and constipation.   Gastrointestinal (Upper):  Patient denies nausea and vomiting.   Constitutional:  Patient denies fever, night sweats, weight loss, and fatigue.   Skin:  Patient denies skin rash/ lesion and itching.   Eyes:  Patient denies blurred vision and double vision.   Ears/ Nose/ Throat:  Patient denies sore throat and sinus problems.   Hematologic/Lymphatic:  Patient denies swollen glands and easy bruising.   Cardiovascular:  Patient denies leg swelling and chest pains.   Respiratory:  Patient denies cough and  shortness of breath.   Endocrine:  Patient denies excessive thirst.   Musculoskeletal:  Patient denies back pain and joint pain.   Neurological:  Patient denies headaches and dizziness.   Psychologic:  Patient denies depression and anxiety.   VITAL SIGNS: None   MULTI-SYSTEM PHYSICAL EXAMINATION:    Constitutional: Well-nourished. No physical deformities. Normally developed. Good grooming.        PAST DATA REVIEWED:   Source Of History:  Patient  Lab Test Review:  PSA  Records Review:  Pathology Reports  X-Ray Review: C.T. Abdomen/Pelvis: Reviewed Films.  Bone Scan: Reviewed Films.      11/11/18  PSA  Total PSA 5.76 ng/mL  Free PSA 0.73 ng/mL  % Free PSA 13 % PSA    PROCEDURES: None   ASSESSMENT:     ICD-10 Details  1 GU:  Prostate Cancer - C61    PLAN:   Document  Letter(s):  Created for Patient: Clinical Summary   Notes:  1. High-risk prostate cancer: I had a detailed discussion with Martin Chase. After reviewing his imaging studies with radiology this morning, it is felt that he is at low risk for harboring true metastatic disease. Additional imaging studies were not recommended. With regard to his options for treatment, I did recommend therapy of curative intent considering his life expectancy and disease parameters.   The patient was counseled about the natural history of prostate cancer and the standard treatment options that are available for prostate cancer. It was explained to him how his age and life expectancy, clinical stage, Gleason score, and PSA affect his prognosis, the decision to proceed with additional staging studies, as well as how that information influences recommended treatment strategies. We discussed the roles for active surveillance, radiation therapy, surgical therapy, androgen deprivation, as well as ablative therapy options for the treatment of prostate cancer as appropriate to his individual cancer situation. We discussed the risks and  benefits of these options with regard to their impact on cancer control and also in terms of potential adverse events, complications, and impact on quality of life particularly related to urinary and sexual function. The patient was encouraged to ask questions throughout the discussion today and all questions were answered to his stated satisfaction. In addition, the patient was provided with and/or directed to appropriate resources and literature for further education about prostate cancer and treatment options.   He was initially leaning toward surgical therapy but after further discussion, he is considering the option of radiation therapy with long-term androgen deprivation. He is scheduled to see both Dr. Alen Blew and Dr. Tammi Klippel. If he does proceed with a combination approach of brachytherapy/external beam radiation therapy, he may benefit from checking his PVR prior to proceeding considering his CT findings that demonstrate a fairly full bladder with diverticuli that do suggest possible bladder outlet obstruction. If he does proceed with surgery, we will further explore the option of the PROTEUS  trial and he will require further discussion regarding the aspects of surgery including the risks of surgery and the expected recovery process prior to his surgery date.   CC: Dr. [name]  Dr. Festus Aloe  Dr. Zola Button  Dr. Tyler Pita   E & M CODE: I spent at least 55 minutes face to face with the patient, more than 50% of that time was spent on counseling and/or coordinating care.

## 2019-01-17 NOTE — Progress Notes (Signed)
Radiation Oncology         [phone]) [phone] ________________________________  Multidisciplinary Prostate Cancer Clinic  Initial Radiation Oncology Consultation  Name: Martin Chase MRN: [id number]  Date: 01/17/2019  DOB: 11-12-1945  CC:Paz, Alda Berthold, MD  Festus Aloe, MD   REFERRING PHYSICIAN: Festus Aloe, MD  DIAGNOSIS: 73 y.o. gentleman with stage T1c adenocarcinoma of the prostate with a Gleason's score of 4+4 and a PSA of 5.76    ICD-10-CM   1. Prostate cancer ([place]) C61     HISTORY OF PRESENT ILLNESS::Martin Chase is a 73 y.o. gentleman with a history of BPH.  He was noted to have an elevated PSA of 5.91 by his primary care physician, Dr. Larose Kells.  Accordingly, he was referred for evaluation in urology by Dr. Junious Silk on 11/11/2018,  digital rectal examination was performed at that time revealing no nodules.  The patient proceeded to transrectal ultrasound with 12 biopsies of the prostate on 12/25/2018.  The prostate volume measured 35 cc.  Out of 12 core biopsies, 4 were positive, all on the right side.  The maximum Gleason score was 4+4, and this was seen in right apex, right mid lateral, and right apex lateral. Gleason 4+3 was seen in right mid. HGPIN was also noted in left base lateral.  He recently underwent bone scan and CT abdomen/pelvis on 01/09/2019. CT results were negative for metastatic disease. Bone scan with results nonspecific for abnormal tracer localization at L3, which appears degenerative. There was also a lesion noted in the sacrum, seen on CT scan with no corollate on bone scan, which could also be degenerative but indeterminate at this time. Recommendations are to proceed with close observation with repeat imaging in the near future.  The patient reviewed the biopsy results with his urologist and he has kindly been referred today to the multidisciplinary prostate cancer clinic for presentation of pathology and radiology studies in our conference for discussion of  potential radiation treatment options and clinical evaluation.   PREVIOUS RADIATION THERAPY: No  PAST MEDICAL HISTORY:  has a past medical history of Allergy, Anemia (1996), Cataract, History of blood transfusion (1996), HOH (hard of hearing), Hyperlipidemia, Hypertension, Prediabetes (10/20/2014), Prostate cancer ([place]), and Sleep apnea.    PAST SURGICAL HISTORY: Past Surgical History:  Procedure Laterality Date   COLONOSCOPY WITH PROPOFOL N/A 09/16/2018   Procedure: COLONOSCOPY WITH PROPOFOL;  Surgeon: Yetta Flock, MD;  Location: [facility];  Service: Gastroenterology;  Laterality: N/A;   POLYPECTOMY  09/16/2018   Procedure: POLYPECTOMY;  Surgeon: Yetta Flock, MD;  Location: [facility];  Service: Gastroenterology;;   SUBMUCOSAL INJECTION  09/16/2018   Procedure: SUBMUCOSAL INJECTION;  Surgeon: Yetta Flock, MD;  Location: [facility];  Service: Gastroenterology;;   TONSILLECTOMY     VASECTOMY      FAMILY HISTORY: family history includes Alzheimer's disease in his mother; Diabetes in his maternal grandmother, maternal uncle, and mother; Heart disease in his maternal grandfather; Prostate cancer (age of onset: 17) in his father; Stroke in his paternal uncle; Suicidality in his brother.  SOCIAL HISTORY:  reports that he quit smoking about 32 years ago. His smoking use included cigarettes. He quit after 5.00 years of use. He has never used smokeless tobacco. He reports current alcohol use of about 7.0 standard drinks of alcohol per week. He reports that he does not use drugs.  ALLERGIES: Patient has no known allergies.  MEDICATIONS:  Current Outpatient Medications  Medication Sig Dispense Refill   Ascorbic Acid (  VITAMIN C) 1000 MG tablet Take 1,000 mg by mouth daily.     atorvastatin (LIPITOR) 20 MG tablet Take 1 tablet (20 mg total) by mouth daily. 90 tablet 3   Calcium Carbonate-Vit D-Min (CALCIUM 1200 PO) Take by mouth.     Cholecalciferol  (VITAMIN D3) 2000 UNITS TABS Take 2,000 Units by mouth daily.      Cyanocobalamin (VITAMIN B-12) 5000 MCG TBDP Take 5,000 mcg by mouth daily.     losartan (COZAAR) 50 MG tablet Take 1 tablet (50 mg total) by mouth daily. 90 tablet 3   Multiple Vitamin (MULTIVITAMIN WITH MINERALS) TABS tablet Take 1 tablet by mouth daily. Centrum     vitamin E 400 UNIT capsule Take 800 Units by mouth daily.     No current facility-administered medications for this encounter.     REVIEW OF SYSTEMS:  On review of systems, the patient reports that he is doing well overall. On patient questionnaire, he reports wearing glasses, hearing loss (wears a hearing aid), sinus problems, dribbling urine, and bruising easily. He denies any chest pain, shortness of breath, cough, fevers, chills, night sweats, unintended weight changes. He denies any bowel disturbances, and denies abdominal pain, nausea or vomiting. He denies any new musculoskeletal or joint aches or pains. His IPSS was 13, indicating moderate urinary symptoms. His SHIM was 13, indicating he does have erectile dysfunction. A complete review of systems is obtained and is otherwise negative.   PHYSICAL EXAM:  Wt Readings from Last 3 Encounters:  01/17/19 219 lb 12.8 oz (99.7 kg)  09/19/18 215 lb 6 oz (97.7 kg)  09/09/18 218 lb 3.2 oz (99 kg)   Temp Readings from Last 3 Encounters:  01/17/19 98.1 F (36.7 C) (Oral)  09/19/18 98.4 F (36.9 C) (Oral)  09/16/18 97.7 F (36.5 C) (Oral)   BP Readings from Last 3 Encounters:  01/17/19 (!) 135/56  09/19/18 108/64  09/16/18 (!) 136/59   Pulse Readings from Last 3 Encounters:  01/17/19 72  09/19/18 (!) 54  09/16/18 (!) 49   Pain Assessment Pain Score: 0-No pain/10  In general this is a well appearing Caucasian gentleman in no acute distress. He is alert and oriented x4 and appropriate throughout the examination. HEENT reveals that the patient is normocephalic, atraumatic. EOMs are intact. PERRLA. Skin is  intact without any evidence of gross lesions. Cardiovascular exam reveals a regular rate and rhythm, no clicks rubs or murmurs are auscultated. Chest is clear to auscultation bilaterally. Lymphatic assessment is performed and does not reveal any adenopathy in the cervical, supraclavicular, axillary, or inguinal chains. Abdomen has active bowel sounds in all quadrants and is intact. The abdomen is soft, non tender, non distended. Lower extremities are negative for pretibial pitting edema, deep calf tenderness, cyanosis or clubbing.  KPS = 90  100 - Normal; no complaints; no evidence of disease. 90   - Able to carry on normal activity; minor signs or symptoms of disease. 80   - Normal activity with effort; some signs or symptoms of disease. 45   - Cares for self; unable to carry on normal activity or to do active work. 60   - Requires occasional assistance, but is able to care for most of his personal needs. 50   - Requires considerable assistance and frequent medical care. 54   - Disabled; requires special care and assistance. 62   - Severely disabled; hospital admission is indicated although death not imminent. 62   - Very sick; hospital admission  necessary; active supportive treatment necessary. 10   - Moribund; fatal processes progressing rapidly. 0     - Dead  Karnofsky DA, Abelmann Arco, Craver LS and Burchenal [facility] [phone]) The use of the nitrogen mustards in the palliative treatment of carcinoma: with particular reference to bronchogenic carcinoma Cancer 1 634-56   LABORATORY DATA:  Lab Results  Component Value Date   WBC 7.5 01/02/2018   HGB 15.0 01/02/2018   HCT 47 01/02/2018   MCV 90.4 01/31/2016   PLT 47 (A) 01/02/2018   Lab Results  Component Value Date   NA 143 09/19/2018   K 4.8 09/19/2018   CL 103 09/19/2018   CO2 31 09/19/2018   Lab Results  Component Value Date   ALT 26 01/02/2018   AST 26 01/02/2018   ALKPHOS 89 01/02/2018   BILITOT 0.7 01/31/2017      RADIOGRAPHY: Nm Bone Scan Whole Body  Result Date: 01/09/2019 CLINICAL DATA:  Prostate cancer, PSA 5.76, no bone pain EXAM: NUCLEAR MEDICINE WHOLE BODY BONE SCAN TECHNIQUE: Whole body anterior and posterior images were obtained approximately 3 hours after intravenous injection of radiopharmaceutical. RADIOPHARMACEUTICALS:  21 mCi Technetium-33m MDP IV COMPARISON:  None Radiographic correlation: None FINDINGS: Uptake at the shoulders, sternoclavicular joints, elbows, RIGHT knee and LEFT foot, typically degenerative. Uptake in the lower lumbar spine at approximately L3; this could be degenerative or metastatic and follow-up imaging assessment is recommended. No additional sites of worrisome osseous tracer accumulation are identified. Significant retained tracer within urinary bladder. Otherwise expected urinary tract and soft tissue distribution of tracer. IMPRESSION: Abnormal tracer localization in lumbar spine at approximately L3, nonspecific, could be degenerative or metastatic; follow-up imaging assessment recommended, initially by lumbar radiographs. Electronically Signed   By: Lavonia Dana M.D.   On: 01/09/2019 16:57      IMPRESSION/PLAN: 73 y.o. gentleman with Stage T1c adenocarcinoma of the prostate with a PSA of 5.76 and a Gleason score of 4+4.    We discussed the patient's workup and outlined the nature of prostate cancer in this setting. The patient's T stage, Gleason's score, and PSA put him into the high risk group. Accordingly, he is eligible for a variety of potential treatment options including long term ADT with 8 weeks of external radiation or a brachytherapy boost followed by 5 weeks of external radiation, or prostatectomy. We discussed the available radiation techniques, and focused on the details and logistics and delivery. We discussed he may not be a good candidate for brachytherapy considering his current obstructive bladder symptoms. We discussed and outlined the risks, benefits, short  and long-term effects associated with radiotherapy and compared and contrasted these with prostatectomy. We discussed the role of SpaceOAR in reducing the rectal toxicity associated with radiotherapy. We also detailed the role of ADT in the treatment of high risk prostate cancer and outlined the associated side effects that could be expected with this therapy.  At the end of the conversation the patient is interested in moving forward with 8 weeks of external beam therapy in combination with ADT. He has not received his first Lupron injection. We will contact [facility] to make arrangements for start of ADT and fiducial marker with SpaceOAR to reduce rectal toxicity from radiotherapy placement prior to simulation. He will be scheduled for simulation in the near future. We will move forward with treatment planning in anticipation of beginning IMRT in the near future.     Nicholos Johns, PA-C    Tyler Pita, MD  [facility]   Radiation Oncology Direct Dial: [phone]   Fax: [phone] [email or web address]   Skype   LinkedIn  This document serves as a record of services personally performed by Tyler Pita, MD and Freeman Caldron, PA-C. It was created on their behalf by Wilburn Mylar, a trained medical scribe. The creation of this record is based on the scribe's personal observations and the provider's statements to them. This document has been checked and approved by the attending provider.

## 2019-01-17 NOTE — Progress Notes (Signed)
Reason for the request: Prostate cancer  HPI: I was asked by Dr. Junious Silk to evaluate Martin Chase for prostate cancer.  He is a 73 year old man with history of hyperlipidemia and hypertension otherwise in reasonably good health.  He was found to have an elevated PSA 5.4 and a routine physical examination without any significant urinary symptoms.  He was evaluated by Dr. Junious Silk and a prostate biopsy obtained on February 2020 showed a Gleason score 4+4 = 8 and 3 cores with 1 core of 4+3 = 7.  His staging work-up including a bone scan that showed an L3 lesion that is less suspicious by imaging study including CT scan of the abdomen and pelvis.  He was referred to the prostate cancer multidisciplinary clinic for evaluation.  He does report occasional frequency or nocturia but has not changed dramatically.  He remains reasonably good health and remains active.  He does not report any headaches, blurry vision, syncope or seizures. Does not report any fevers, chills or sweats.  Does not report any cough, wheezing or hemoptysis.  Does not report any chest pain, palpitation, orthopnea or leg edema.  Does not report any nausea, vomiting or abdominal pain.  Does not report any constipation or diarrhea.  Does not report any skeletal complaints.    Does not report frequency, urgency or hematuria.  Does not report any skin rashes or lesions. Does not report any heat or cold intolerance.  Does not report any lymphadenopathy or petechiae.  Does not report any anxiety or depression.  Remaining review of systems is negative.    Past Medical History:  Diagnosis Date  . Allergy   . Anemia 1996   due to a bleeding from aspirin  . Cataract   . History of blood transfusion 1996  . HOH (hard of hearing)    uses hearing aids/ hearing on left side worse  . Hyperlipidemia   . Hypertension   . Prediabetes 10/20/2014  . Prostate cancer ([place])   . Sleep apnea    uses c-pap  :  Past Surgical History:  Procedure  Laterality Date  . COLONOSCOPY WITH PROPOFOL N/A 09/16/2018   Procedure: COLONOSCOPY WITH PROPOFOL;  Surgeon: Yetta Flock, MD;  [facility];  Service: Gastroenterology;  Laterality: N/A;  . POLYPECTOMY  09/16/2018   Procedure: POLYPECTOMY;  Surgeon: Yetta Flock, MD;  Location[facility];  Service: Gastroenterology;;  . Lia Foyer INJECTION  09/16/2018   Procedure: SUBMUCOSAL INJECTION;  Surgeon: Yetta Flock, MD;  [facility];  Service: Gastroenterology;;  . TONSILLECTOMY    . VASECTOMY    :   Current Outpatient Medications:  .  Ascorbic Acid (VITAMIN C) 1000 MG tablet, Take 1,000 mg by mouth daily., Disp: , Rfl:  .  atorvastatin (LIPITOR) 20 MG tablet, Take 1 tablet (20 mg total) by mouth daily., Disp: 90 tablet, Rfl: 3 .  Calcium Carbonate-Vit D-Min (CALCIUM 1200 PO), Take by mouth., Disp: , Rfl:  .  Cholecalciferol (VITAMIN D3) 2000 UNITS TABS, Take 2,000 Units by mouth daily. , Disp: , Rfl:  .  Cyanocobalamin (VITAMIN B-12) 5000 MCG TBDP, Take 5,000 mcg by mouth daily., Disp: , Rfl:  .  losartan (COZAAR) 50 MG tablet, Take 1 tablet (50 mg total) by mouth daily., Disp: 90 tablet, Rfl: 3 .  Multiple Vitamin (MULTIVITAMIN WITH MINERALS) TABS tablet, Take 1 tablet by mouth daily. Centrum, Disp: , Rfl:  .  vitamin E 400 UNIT capsule, Take 800 Units by mouth daily., Disp: ,  Rfl: :  No Known Allergies:  Family History  Problem Relation Age of Onset  . Diabetes Mother   . Alzheimer's disease Mother   . Prostate cancer Father 86       prostatectomy?  . Diabetes Maternal Uncle   . Stroke Paternal Uncle   . Diabetes Maternal Grandmother   . Heart disease Maternal Grandfather   . Suicidality Brother   . Colon cancer Neg Hx   . Rectal cancer Neg Hx   . Esophageal cancer Neg Hx   . Stomach cancer Neg Hx   :  Social History   Socioeconomic History  . Marital status: Married    Spouse name: Martin Chase  . Number of children: 0  . Years  of education: Not on file  . Highest education level: Not on file  Occupational History  . Occupation: retired, [employer]  Social Needs  . Financial resource strain: Not on file  . Food insecurity:    Worry: Not on file    Inability: Not on file  . Transportation needs:    Medical: Not on file    Non-medical: Not on file  Tobacco Use  . Smoking status: Former Smoker    Years: 5.00    Types: Cigarettes    Last attempt to quit: 01/29/1986    Years since quitting: 32.9  . Smokeless tobacco: Never Used  Substance and Sexual Activity  . Alcohol use: Yes    Alcohol/week: 7.0 standard drinks    Types: 7 Glasses of wine per week    Comment: 1 glass of wine with dinner  . Drug use: No  . Sexual activity: Not Currently  Lifestyle  . Physical activity:    Days per week: Not on file    Minutes per session: Not on file  . Stress: Not on file  Relationships  . Social connections:    Talks on phone: Not on file    Gets together: Not on file    Attends religious service: Not on file    Active member of club or organization: Not on file    Attends meetings of clubs or organizations: Not on file    Relationship status: Not on file  . Intimate partner violence:    Fear of current or ex partner: Not on file    Emotionally abused: Not on file    Physically abused: Not on file    Forced sexual activity: Not on file  Other Topics Concern  . Not on file  Social History Narrative   Household -- pt and wife  :  Pertinent items are noted in HPI.  Exam: ECOG 0  General appearance: alert and cooperative appeared without distress. Head: atraumatic without any abnormalities. Eyes: conjunctivae/corneas clear. PERRL.  Sclera anicteric. Throat: lips, mucosa, and tongue normal; without oral thrush or ulcers. Resp: clear to auscultation bilaterally without rhonchi, wheezes or dullness to percussion. Cardio: regular rate and rhythm, S1, S2 normal, no murmur, click, rub or  gallop GI: soft, non-tender; bowel sounds normal; no masses,  no organomegaly Skin: Skin color, texture, turgor normal. No rashes or lesions Lymph nodes: Cervical, supraclavicular, and axillary nodes normal. Neurologic: Grossly normal without any motor, sensory or deep tendon reflexes. Musculoskeletal: No joint deformity or effusion.    Nm Bone Scan Whole Body  Result Date: 01/09/2019 CLINICAL DATA:  Prostate cancer, PSA 5.76, no bone pain EXAM: NUCLEAR MEDICINE WHOLE BODY BONE SCAN TECHNIQUE: Whole body anterior and posterior images were obtained approximately 3 hours  after intravenous injection of radiopharmaceutical. RADIOPHARMACEUTICALS:  21 mCi Technetium-41m MDP IV COMPARISON:  None Radiographic correlation: None FINDINGS: Uptake at the shoulders, sternoclavicular joints, elbows, RIGHT knee and LEFT foot, typically degenerative. Uptake in the lower lumbar spine at approximately L3; this could be degenerative or metastatic and follow-up imaging assessment is recommended. No additional sites of worrisome osseous tracer accumulation are identified. Significant retained tracer within urinary bladder. Otherwise expected urinary tract and soft tissue distribution of tracer. IMPRESSION: Abnormal tracer localization in lumbar spine at approximately L3, nonspecific, could be degenerative or metastatic; follow-up imaging assessment recommended, initially by lumbar radiographs. Electronically Signed   By: Lavonia Dana M.D.   On: 01/09/2019 16:57    Assessment and Plan:   73 year old man with prostate cancer diagnosed in February 2020.  His PSA 5.4, Gleason score 4+4 = 8 indicating high risk disease.  His case was discussed today in the prostate cancer multidisciplinary clinic including imaging review and discussion with radiology.  His pathology results were also reviewed by the reviewing pathologist.  Treatment options were reviewed today as well as the natural course of this disease.  His disease  appears to be localized in his L3 abnormality appears to be nonspecific.  Those options would include primary surgical therapy versus definitive radiation therapy with long-term ADT for 2 years.  Risks and benefits of both approaches were reviewed today.  Long-term complication associated with ADT were reviewed which include weight gain, hot flashes, sexual dysfunction among others.  He understands that he still has a high risk of developing recurrent disease where additional systemic therapy may be needed.  His disease is potentially curable at this time but is incurable if he develops metastatic disease.  All his questions were answered today to his satisfaction and he will weigh both options at this time.  40 . minutes was spent with the patient face-to-face today.  More than 50% of time was dedicated to reviewing his disease status, treatment options, reviewing imaging studies and pathology results.   Thank you for the referral.  A copy of this consult has been forwarded to the requesting physician.

## 2019-01-17 NOTE — Telephone Encounter (Signed)
No los °

## 2019-01-17 NOTE — Addendum Note (Signed)
Encounter addended by: Raynelle Bring, MD on: 01/17/2019 12:48 PM  Actions taken: Clinical Note Signed

## 2019-01-17 NOTE — Progress Notes (Signed)
                               Care Plan Summary  Name:  Mr. Jamarian Jacinto DOB: Jul 13, 1946   Your Medical Team:   Urologist -  Dr. Raynelle Bring, [facility]  Radiation Oncologist - Dr. Tyler Pita, [facility]   Medical Oncologist - Dr. Zola Button, [place]  Recommendations: 1) Androgen deprivation x 2 years (hormone injection)  2) Radiation  3) Robotic prostatectomy  * These recommendations are based on information available as of today's consult.      Recommendations may change depending on the results of further tests or exams.  Next Steps: 1) Consider options and call Cira Rue, RN with treatment decision     When appointments need to be scheduled, you will be contacted by [facility] and/or [facility].  Patient provided with business cards for all team members and a copy of "Fall Prevention Patient Safety Sheet". Questions?  Please do not hesitate to call Cira Rue, RN, BSN, OCN at [phone] any questions or concerns.  Shirlean Mylar is your Oncology Nurse Navigator and is available to assist you while you're receiving your medical care at [facility]

## 2019-01-20 ENCOUNTER — Telehealth: Payer: Self-pay | Admitting: Medical Oncology

## 2019-01-20 NOTE — Telephone Encounter (Signed)
Patient called asking how long after androgen deprivation would he start radiation treatments. I explained it is about 8 weeks. He decided on ADT and 8 weeks IMRT after the [facility] but now unsure of his decision.This weekend a friend told him he had brachytherapy and now he is not sure if he has made the correct decision. I explained that Dr. Tammi Klippel is concerned about doing brachytherapy due to his bladder outlet obstruction. He would like to  meet with Dr. Junious Silk to further discuss options before making his final decision. I encouraged him to do this and I will send Dr. Junious Silk a message with the above.

## 2019-01-21 ENCOUNTER — Encounter: Payer: Self-pay | Admitting: General Practice

## 2019-01-21 NOTE — Progress Notes (Signed)
[place] Psychosocial Distress Screening Martin Chase presented to Prostate Multidisciplinary Clinic completing distress screen per protocol.  The patient scored a 0 on the Psychosocial Distress Thermometer which indicates minimal distress. Reached his wife Martin Chase (note in chart that DPR was signed) when calling to assess for distress and other psychosocial needs.   ONCBCN DISTRESS SCREENING 01/21/2019  Screening Type Initial Screening  Distress experienced in past week (1-10) 0  Referral to support programs Yes   Martin Chase reports minimal distress. Introduced Nature conservation officer (noting that programs are suspended during COVID-19 precautions), particularly encouraging Spiritual Care as an ongoing support available telephonically. Normalized feelings of distress related to dx/mortality/COVID-19, providing pastoral listening and emotional support.  Follow up needed: No. Encouraged pt callback. Per spouse, no other needs at this time.   [place], [place], [facility] Pager [phone] Voicemail [phone]

## 2019-01-23 ENCOUNTER — Telehealth: Payer: Self-pay | Admitting: Medical Oncology

## 2019-01-23 NOTE — Telephone Encounter (Signed)
Martin Chase called to inform me he has an appointment with Dr. Junious Silk 3/20 at 1:30 pm to discuss brachytherapy. He had originally wanted to do IMRT but had a friend who had seed implant and wanted to discuss in more detail.

## 2019-01-24 DIAGNOSIS — R3914 Feeling of incomplete bladder emptying: Secondary | ICD-10-CM | POA: Diagnosis not present

## 2019-02-03 ENCOUNTER — Institutional Professional Consult (permissible substitution): Payer: Medicare Other | Admitting: Pulmonary Disease

## 2019-02-03 DIAGNOSIS — R350 Frequency of micturition: Secondary | ICD-10-CM | POA: Diagnosis not present

## 2019-02-03 DIAGNOSIS — R3914 Feeling of incomplete bladder emptying: Secondary | ICD-10-CM | POA: Diagnosis not present

## 2019-02-04 ENCOUNTER — Institutional Professional Consult (permissible substitution): Payer: Medicare Other | Admitting: Pulmonary Disease

## 2019-02-19 DIAGNOSIS — R3914 Feeling of incomplete bladder emptying: Secondary | ICD-10-CM | POA: Diagnosis not present

## 2019-02-20 ENCOUNTER — Encounter: Payer: Self-pay | Admitting: Medical Oncology

## 2019-02-20 NOTE — Progress Notes (Signed)
Returned call to Patsy-wife regarding concerns over patient's walking. She states there was ? Cancer at L3 and she has noticed her husband not walking well and walking humped over. She is worried he has cancer in the spine. I informed her the radiologist reviewed the bone scan and CT at [facility]. It was determined it was degenerative  disease at L3 with recommendations to observe him and do follow up imaging in the near future. He saw Dr. Junious Silk yesterday and will be scheduled for a Urolift, fiducial markers and SpaceOar. I asked her to call if he develops back pain or his walking gets worse so we can get follow up imaging scheduled. She voiced understanding and was very appreciative for the return call.

## 2019-02-27 ENCOUNTER — Encounter: Payer: Self-pay | Admitting: Urology

## 2019-02-27 NOTE — Progress Notes (Signed)
Patient started ADT with 6 month Lupron on 01/24/19 with Dr. Junious Silk.  Current plan is to schedule patient for Urolift with fiducial markers and SpaceOAR gel in preparation to begin a 5 week course of prostate IMRT followed by brachytherapy boost.  Nicholos Johns, MMS, PA-C [place] at [place]: [phone]  Fax: [phone]

## 2019-03-05 ENCOUNTER — Telehealth: Payer: Self-pay

## 2019-03-05 DIAGNOSIS — D120 Benign neoplasm of cecum: Secondary | ICD-10-CM

## 2019-03-05 DIAGNOSIS — Z86010 Personal history of colonic polyps: Secondary | ICD-10-CM

## 2019-03-05 NOTE — Telephone Encounter (Signed)
Pt's wife called back and let me know that pt is undergoing treatment for Prostate cancer with Dr. Junious Silk and they think they should wait until he is done with that to schedule his repeat colon. They will call us once that is complete to get him scheduled when Dr. Junious Silk advises it safe to do so.

## 2019-03-05 NOTE — Telephone Encounter (Signed)
Called and spoke to pt's wife about scheduling his colon in the [facility] on 03-19-19 with [place].  She will discuss with her husband and call me back to schedule

## 2019-03-06 ENCOUNTER — Telehealth: Payer: Self-pay | Admitting: Medical Oncology

## 2019-03-06 NOTE — Telephone Encounter (Signed)
Okay thanks for the follow up Jan °

## 2019-03-06 NOTE — Telephone Encounter (Signed)
Patsy-wife called stating her husband would like to get Urolift, markers and SpaceOar as soon as possible. He is ready to move forward with radiation. I informed her that there are surgery delays due to COVID-19 but I will be happy to forward this message to Dr. Junious Silk. She states it is time for his screening colonoscopy but he is going to delay until after completion of radiation.

## 2019-03-07 ENCOUNTER — Telehealth: Payer: Self-pay | Admitting: Medical Oncology

## 2019-03-07 NOTE — Telephone Encounter (Signed)
Spoke with patient to let him know I heard from Dr. Junious Silk regarding Urolift. Per Dr. Junious Silk he is waiting on insurance approval and one he receives it, he will get patient scheduled. He voiced understanding and thanked me for the follow up. I asked him to call me with question or concerns.

## 2019-04-14 ENCOUNTER — Telehealth: Payer: Self-pay | Admitting: Gastroenterology

## 2019-04-14 ENCOUNTER — Encounter: Payer: Self-pay | Admitting: Gastroenterology

## 2019-04-14 MED ORDER — SUPREP BOWEL PREP KIT 17.5-3.13-1.6 GM/177ML PO SOLN: ORAL | 0 refills | Status: DC

## 2019-04-14 NOTE — Telephone Encounter (Signed)
Patient wife called would like to go ahead and schedule her appt before he starts treatment for prostate cancer.  Patient wife CB# [phone]

## 2019-04-14 NOTE — Addendum Note (Signed)
Addended by: Roetta Sessions on: 04/14/2019 02:59 PM   Modules accepted: Orders

## 2019-04-14 NOTE — Telephone Encounter (Signed)
Called and scheduled pt for colon in the [facility] on Monday, 04-21-19 at 8:30am. He will come to the office this Wednesday, 6-10 to be instructed and to sign Acknowledgement.

## 2019-04-18 ENCOUNTER — Telehealth: Payer: Self-pay | Admitting: *Deleted

## 2019-04-18 ENCOUNTER — Other Ambulatory Visit: Payer: Self-pay | Admitting: Urology

## 2019-04-18 ENCOUNTER — Telehealth: Payer: Self-pay

## 2019-04-18 DIAGNOSIS — C61 Malignant neoplasm of prostate: Secondary | ICD-10-CM

## 2019-04-18 NOTE — Telephone Encounter (Signed)
Covid-19 screening questions  Have you traveled in the last 14 days? No. If yes where?  Do you now or have you had a fever in the last 14 days? No.  Do you have any respiratory symptoms of shortness of breath or cough now or in the last 14 days? No.  Do you have any family members or close contacts with diagnosed or suspected Covid-19 in the past 14 days? No.  Have you been tested for Covid-19 and found to be positive? No.       

## 2019-04-18 NOTE — Telephone Encounter (Signed)
Called patient to inform of Urolift and space oar for 04-28-19 @ [facility] and his sim on 05-02-19 @ 2 pm @ Dr. Johny Shears Office, spoke with patient and he is aware of this appt.

## 2019-04-21 ENCOUNTER — Other Ambulatory Visit: Payer: Self-pay

## 2019-04-21 ENCOUNTER — Ambulatory Visit (AMBULATORY_SURGERY_CENTER): Payer: Medicare Other | Admitting: Gastroenterology

## 2019-04-21 ENCOUNTER — Encounter: Payer: Self-pay | Admitting: Gastroenterology

## 2019-04-21 VITALS — BP 113/62 | HR 64 | Temp 98.4°F | Resp 12 | Ht 71.0 in | Wt 215.0 lb

## 2019-04-21 DIAGNOSIS — Z860100 Personal history of colon polyps, unspecified: Secondary | ICD-10-CM

## 2019-04-21 DIAGNOSIS — D122 Benign neoplasm of ascending colon: Secondary | ICD-10-CM

## 2019-04-21 DIAGNOSIS — D120 Benign neoplasm of cecum: Secondary | ICD-10-CM | POA: Diagnosis not present

## 2019-04-21 DIAGNOSIS — D124 Benign neoplasm of descending colon: Secondary | ICD-10-CM | POA: Diagnosis not present

## 2019-04-21 DIAGNOSIS — D123 Benign neoplasm of transverse colon: Secondary | ICD-10-CM

## 2019-04-21 DIAGNOSIS — Z1211 Encounter for screening for malignant neoplasm of colon: Secondary | ICD-10-CM | POA: Diagnosis not present

## 2019-04-21 MED ORDER — SODIUM CHLORIDE 0.9 % IV SOLN: 500.0000 mL | Freq: Once | INTRAVENOUS | Status: DC

## 2019-04-21 NOTE — Progress Notes (Signed)
Called to room to assist during endoscopic procedure.  Patient ID and intended procedure confirmed with present staff. Received instructions for my participation in the procedure from the performing physician.  

## 2019-04-21 NOTE — Op Note (Signed)
[place] Patient Name: Martin Chase Procedure Date: 04/21/2019 7:58 AM MRN: [id number] Endoscopist: Remo Lipps P. Havery Moros , MD Age: 73 Referring MD:  Date of Birth: [birth year] Gender: Male Account #: [id number] Procedure:                Colonoscopy Indications:              Surveillance: History of numerous adenomas (15                            within past year) including EMR with piecemeal                            removal of large cecal sessile adenoma last                            colonoscopy 6 months ago Medicines:                Monitored Anesthesia Care Procedure:                Pre-Anesthesia Assessment:                           - Prior to the procedure, a History and Physical                            was performed, and patient medications and                            allergies were reviewed. The patient's tolerance of                            previous anesthesia was also reviewed. The risks                            and benefits of the procedure and the sedation                            options and risks were discussed with the patient.                            All questions were answered, and informed consent                            was obtained. Prior Anticoagulants: The patient has                            taken no previous anticoagulant or antiplatelet                            agents. ASA Grade Assessment: II - A patient with                            mild systemic disease. After reviewing the risks  and benefits, the patient was deemed in                            satisfactory condition to undergo the procedure.                           After obtaining informed consent, the colonoscope                            was passed under direct vision. Throughout the                            procedure, the patient's blood pressure, pulse, and                            oxygen saturations were monitored  continuously. The                            Colonoscope was introduced through the anus and                            advanced to the the cecum, identified by                            appendiceal orifice and ileocecal valve. The                            colonoscopy was performed without difficulty. The                            patient tolerated the procedure well. The quality                            of the bowel preparation was good. The ileocecal                            valve, appendiceal orifice, and rectum were                            photographed. Scope In: 8:04:14 AM Scope Out: 8:24:56 AM Scope Withdrawal Time: 0 hours 16 minutes 14 seconds  Total Procedure Duration: 0 hours 20 minutes 42 seconds  Findings:                 The perianal and digital rectal examinations were                            normal.                           A large polypectomy scar was noted in the cecum. A                            small area of 3 mm residual polypoid tissue was  found and easily removed with a cold snare.                            Resection and retrieval were complete.                           Two sessile polyps were found in the ascending                            colon. The polyps were 3 to 4 mm in size. These                            polyps were removed with a cold snare. Resection                            and retrieval were complete.                           Two sessile polyps were found in the transverse                            colon. The polyps were 3 to 4 mm in size. These                            polyps were removed with a cold snare. Resection                            and retrieval were complete.                           Two sessile polyps were found in the descending                            colon. The polyps were 3 mm in size. These polyps                            were removed with a cold snare. Resection and                             retrieval were complete.                           A few small-mouthed diverticula were found in the                            sigmoid colon.                           Internal hemorrhoids were found during retroflexion.                           The exam was otherwise without abnormality. Complications:            No immediate complications. Estimated blood loss:  Minimal. Estimated Blood Loss:     Estimated blood loss was minimal. Impression:               - One 3 mm polyp in the cecum, removed with a cold                            snare. Resected and retrieved.                           - Two 3 to 4 mm polyps in the ascending colon,                            removed with a cold snare. Resected and retrieved.                           - Two 3 to 4 mm polyps in the transverse colon,                            removed with a cold snare. Resected and retrieved.                           - Two 3 mm polyps in the descending colon, removed                            with a cold snare. Resected and retrieved.                           - Diverticulosis in the sigmoid colon.                           - Internal hemorrhoids.                           - The examination was otherwise normal. Recommendation:           - Patient has a contact number available for                            emergencies. The signs and symptoms of potential                            delayed complications were discussed with the                            patient. Return to normal activities tomorrow.                            Written discharge instructions were provided to the                            patient.                           - Resume previous diet.                           -  Continue present medications.                           - Await pathology results.                           - Repeat colonoscopy in 1 year given burden of                             polyps removed on the last few colonoscopy                           - Will discuss possibility of genetic testing with                            the patient Martin Chase. Martin Weckerly, MD 04/21/2019 8:31:19 AM This report has been signed electronically.

## 2019-04-21 NOTE — Progress Notes (Signed)
Report given to PACU, vss 

## 2019-04-21 NOTE — Patient Instructions (Addendum)
Handouts Provided:  Polyps  YOU HAD AN ENDOSCOPIC PROCEDURE TODAY AT [facility]:   Refer to the procedure report that was given to you for any specific questions about what was found during the examination.  If the procedure report does not answer your questions, please call your gastroenterologist to clarify.  If you requested that your care partner not be given the details of your procedure findings, then the procedure report has been included in a sealed envelope for you to review at your convenience later.  YOU SHOULD EXPECT: Some feelings of bloating in the abdomen. Passage of more gas than usual.  Walking can help get rid of the air that was put into your GI tract during the procedure and reduce the bloating. If you had a lower endoscopy (such as a colonoscopy or flexible sigmoidoscopy) you may notice spotting of blood in your stool or on the toilet paper. If you underwent a bowel prep for your procedure, you may not have a normal bowel movement for a few days.  Please Note:  You might notice some irritation and congestion in your nose or some drainage.  This is from the oxygen used during your procedure.  There is no need for concern and it should clear up in a day or so.  SYMPTOMS TO REPORT IMMEDIATELY:   Following lower endoscopy (colonoscopy or flexible sigmoidoscopy):  Excessive amounts of blood in the stool  Significant tenderness or worsening of abdominal pains  Swelling of the abdomen that is new, acute  Fever of 100F or higher  For urgent or emergent issues, a gastroenterologist can be reached at any hour by calling [phone].   DIET:  We do recommend a small meal at first, but then you may proceed to your regular diet.  Drink plenty of fluids but you should avoid alcoholic beverages for 24 hours.  ACTIVITY:  You should plan to take it easy for the rest of today and you should NOT DRIVE or use heavy machinery until tomorrow (because of the sedation  medicines used during the test).    FOLLOW UP: Our staff will call the number listed on your records 48-72 hours following your procedure to check on you and address any questions or concerns that you may have regarding the information given to you following your procedure. If we do not reach you, we will leave a message.  We will attempt to reach you two times.  During this call, we will ask if you have developed any symptoms of COVID 19. If you develop any symptoms (ie: fever, flu-like symptoms, shortness of breath, cough etc.) before then, please call [phone].  If you test positive for Covid 19 in the 2 weeks post procedure, please call and report this information to [place].    If any biopsies were taken you will be contacted by phone or by letter within the next 1-3 weeks.  Please call us at [phone] if you have not heard about the biopsies in 3 weeks.    SIGNATURES/CONFIDENTIALITY: You and/or your care partner have signed paperwork which will be entered into your electronic medical record.  These signatures attest to the fact that that the information above on your After Visit Summary has been reviewed and is understood.  Full responsibility of the confidentiality of this discharge information lies with you and/or your care-partner.

## 2019-04-23 ENCOUNTER — Telehealth: Payer: Self-pay | Admitting: *Deleted

## 2019-04-23 NOTE — Telephone Encounter (Signed)
1. Have you developed a fever since your procedure? no  2.   Have you had an respiratory symptoms (SOB or cough) since your procedure? no  3.   Have you tested positive for COVID 19 since your procedure no  4.   Have you had any family members/close contacts diagnosed with the COVID 19 since your procedure?  no   If yes to any of these questions please route to Joylene John, RN and Alphonsa Gin, Therapist, sports.  Follow up Call-  Call back number 04/21/2019 08/23/2018  Post procedure Call Back phone  # [phone]  Permission to leave phone message Yes Yes  Some recent data might be hidden     Patient questions:  Do you have a fever, pain , or abdominal swelling? No. Pain Score  0 *  Have you tolerated food without any problems? Yes.    Have you been able to return to your normal activities? Yes.    Do you have any questions about your discharge instructions: Diet   No. Medications  No. Follow up visit  No.  Do you have questions or concerns about your Care? No.  Actions: * If pain score is 4 or above: No action needed, pain <4.

## 2019-04-28 DIAGNOSIS — R3914 Feeling of incomplete bladder emptying: Secondary | ICD-10-CM | POA: Diagnosis not present

## 2019-05-01 ENCOUNTER — Ambulatory Visit (INDEPENDENT_AMBULATORY_CARE_PROVIDER_SITE_OTHER): Payer: Medicare Other | Admitting: Internal Medicine

## 2019-05-01 ENCOUNTER — Other Ambulatory Visit: Payer: Self-pay

## 2019-05-01 ENCOUNTER — Encounter: Payer: Self-pay | Admitting: Internal Medicine

## 2019-05-01 ENCOUNTER — Telehealth: Payer: Self-pay | Admitting: *Deleted

## 2019-05-01 VITALS — BP 119/56 | HR 64 | Temp 98.2°F | Resp 16 | Ht 71.0 in | Wt 216.4 lb

## 2019-05-01 DIAGNOSIS — E785 Hyperlipidemia, unspecified: Secondary | ICD-10-CM

## 2019-05-01 DIAGNOSIS — I10 Essential (primary) hypertension: Secondary | ICD-10-CM | POA: Diagnosis not present

## 2019-05-01 DIAGNOSIS — C61 Malignant neoplasm of prostate: Secondary | ICD-10-CM

## 2019-05-01 DIAGNOSIS — R739 Hyperglycemia, unspecified: Secondary | ICD-10-CM | POA: Diagnosis not present

## 2019-05-01 LAB — CBC WITH DIFFERENTIAL/PLATELET
Basophils Absolute: 0.1 10*3/uL (ref 0.0–0.1)
Basophils Relative: 0.9 % (ref 0.0–3.0)
Eosinophils Absolute: 0.4 10*3/uL (ref 0.0–0.7)
Eosinophils Relative: 4 % (ref 0.0–5.0)
HCT: 38.6 % — ABNORMAL LOW (ref 39.0–52.0)
Hemoglobin: 13 g/dL (ref 13.0–17.0)
Lymphocytes Relative: 24.5 % (ref 12.0–46.0)
Lymphs Abs: 2.2 10*3/uL (ref 0.7–4.0)
MCHC: 33.7 g/dL (ref 30.0–36.0)
MCV: 91.9 fl (ref 78.0–100.0)
Monocytes Absolute: 0.9 10*3/uL (ref 0.1–1.0)
Monocytes Relative: 9.9 % (ref 3.0–12.0)
Neutro Abs: 5.4 10*3/uL (ref 1.4–7.7)
Neutrophils Relative %: 60.7 % (ref 43.0–77.0)
Platelets: 227 10*3/uL (ref 150.0–400.0)
RBC: 4.2 Mil/uL — ABNORMAL LOW (ref 4.22–5.81)
RDW: 14.6 % (ref 11.5–15.5)
WBC: 9 10*3/uL (ref 4.0–10.5)

## 2019-05-01 LAB — LIPID PANEL
Cholesterol: 148 mg/dL (ref 0–200)
HDL: 64.2 mg/dL (ref >39.00)
LDL Cholesterol: 73 mg/dL (ref 0–99)
NonHDL: 83.6
Total CHOL/HDL Ratio: 2
Triglycerides: 52 mg/dL (ref 0.0–149.0)
VLDL: 10.4 mg/dL (ref 0.0–40.0)

## 2019-05-01 LAB — HEMOGLOBIN A1C: Hgb A1c MFr Bld: 6.4 % (ref 4.6–6.5)

## 2019-05-01 LAB — COMPREHENSIVE METABOLIC PANEL
ALT: 17 U/L (ref 0–53)
AST: 25 U/L (ref 0–37)
Albumin: 4.1 g/dL (ref 3.5–5.2)
Alkaline Phosphatase: 67 U/L (ref 39–117)
BUN: 16 mg/dL (ref 6–23)
CO2: 27 mEq/L (ref 19–32)
Calcium: 8.9 mg/dL (ref 8.4–10.5)
Chloride: 103 mEq/L (ref 96–112)
Creatinine, Ser: 0.94 mg/dL (ref 0.40–1.50)
GFR: 78.32 mL/min (ref >60.00)
Glucose, Bld: 114 mg/dL — ABNORMAL HIGH (ref 70–99)
Potassium: 5.1 mEq/L (ref 3.5–5.1)
Sodium: 140 mEq/L (ref 135–145)
Total Bilirubin: 0.9 mg/dL (ref 0.2–1.2)
Total Protein: 6.9 g/dL (ref 6.0–8.3)

## 2019-05-01 NOTE — Patient Instructions (Signed)
Get the blood work     [place] Schedule your next appointment  For a physical exam in 6 months    Get a flu shot early this season

## 2019-05-01 NOTE — Telephone Encounter (Signed)
CALLED PATIENT TO REMIND OF SIM APPT. FOR 05-02-19 @ 2 PM @ DR. MANNING'S OFFICE AND HIS MRI FOR 05-02-19 - ARRIVAL TIME- 3:30 PM @ [facility], NO RESTRICTIONS TO TEST, LVM FOR A RETURN CALL

## 2019-05-01 NOTE — Progress Notes (Signed)
  Radiation Oncology         [phone] ________________________________  Name: Martin Chase MRN: [id number]  Date: 05/02/2019  DOB: 1945-11-29  SIMULATION AND TREATMENT PLANNING NOTE    ICD-10-CM   1. Prostate cancer [facility])  C61     DIAGNOSIS:  73 y.o. gentleman with stage T1c adenocarcinoma of the prostate with a Gleason's score of 4+4 and a PSA of 5.76  NARRATIVE:  The patient was brought to the [facility].  Identity was confirmed.  All relevant records and images related to the planned course of therapy were reviewed.  The patient freely provided informed written consent to proceed with treatment after reviewing the details related to the planned course of therapy. The consent form was witnessed and verified by the simulation staff.  Then, the patient was set-up in a stable reproducible supine position for radiation therapy.  A vacuum lock pillow device was custom fabricated to position his legs in a reproducible immobilized position.  Then, I performed a urethrogram under sterile conditions to identify the prostatic apex.  CT images were obtained.  Surface markings were placed.  The CT images were loaded into the planning software.  Then the prostate target and avoidance structures including the rectum, bladder, bowel and hips were contoured.  Treatment planning then occurred.  The radiation prescription was entered and confirmed.  A total of one complex treatment devices were fabricated. I have requested : Intensity Modulated Radiotherapy (IMRT) is medically necessary for this case for the following reason:  Rectal sparing.[name]  PLAN:  The patient will receive 45 Gy in 25 fractions of 1.8 Gy, followed by a boost to the prostate to a total dose of 75 Gy with 15 additional fractions of 2.0 Gy.  ________________________________  Sheral Apley Tammi Klippel, M.D.  This document serves as a record of services personally performed by Tyler Pita, MD. It was created on his behalf by  Wilburn Mylar, a trained medical scribe. The creation of this record is based on the scribe's personal observations and the provider's statements to them. This document has been checked and approved by the attending provider.

## 2019-05-01 NOTE — Progress Notes (Signed)
Pre visit review using our clinic review tool, if applicable. No additional management support is needed unless otherwise documented below in the visit note. 

## 2019-05-01 NOTE — Progress Notes (Signed)
Subjective:    Patient ID: Martin Chase, male    DOB: [birth year], 73 y.o.   MRN: [id number]  DOS:  05/01/2019 Type of visit - description: Routine office visit Since the last office visit, he was diagnosed with prostate cancer, we discussed the treatment with the patient. HTN: Ambulatory BP is very good, good med compliance High cholesterol: Good compliance with medication, due for labs Hyperglycemia: Due for labs Chart reviewed: Had a colonoscopy 04/2019  Review of Systems  Denies fever chills.  No chest pain no difficulty breathing No nausea, vomiting, diarrhea No cough Emotionally doing well. COVID-19: Following very good precautions.   Past Medical History:  Diagnosis Date  . Allergy   . Anemia 1996   due to a bleeding from aspirin  . Arthritis   . Cataract   . History of blood transfusion 1996  . HOH (hard of hearing)    uses hearing aids/ hearing on left side worse  . Hyperlipidemia   . Hypertension   . Prediabetes 10/20/2014  . Prostate cancer ([place])   . Sleep apnea    uses c-pap    Past Surgical History:  Procedure Laterality Date  . COLONOSCOPY WITH PROPOFOL N/A 09/16/2018   Procedure: COLONOSCOPY WITH PROPOFOL;  Surgeon: Yetta Flock, MD;  Location: [facility];  Service: Gastroenterology;  Laterality: N/A;  . POLYPECTOMY  09/16/2018   Procedure: POLYPECTOMY;  Surgeon: Yetta Flock, MD;  Location: [facility];  Service: Gastroenterology;;  . Lia Foyer INJECTION  09/16/2018   Procedure: SUBMUCOSAL INJECTION;  Surgeon: Yetta Flock, MD;  Location: [facility];  Service: Gastroenterology;;  . TONSILLECTOMY    . VASECTOMY      Social History   Socioeconomic History  . Marital status: Married    Spouse name: Patsy  . Number of children: 0  . Years of education: Not on file  . Highest education level: Not on file  Occupational History  . Occupation: retired, [employer]  Social Needs  . Financial resource  strain: Not on file  . Food insecurity    Worry: Not on file    Inability: Not on file  . Transportation needs    Medical: Not on file    Non-medical: Not on file  Tobacco Use  . Smoking status: Former Smoker    Years: 5.00    Types: Cigarettes    Quit date: 01/29/1986    Years since quitting: 33.2  . Smokeless tobacco: Never Used  Substance and Sexual Activity  . Alcohol use: Yes    Alcohol/week: 7.0 standard drinks    Types: 7 Glasses of wine per week    Comment: 1 glass of wine with dinner  . Drug use: No  . Sexual activity: Not Currently  Lifestyle  . Physical activity    Days per week: Not on file    Minutes per session: Not on file  . Stress: Not on file  Relationships  . Social Herbalist on phone: Not on file    Gets together: Not on file    Attends religious service: Not on file    Active member of club or organization: Not on file    Attends meetings of clubs or organizations: Not on file    Relationship status: Not on file  . Intimate partner violence    Fear of current or ex partner: Not on file    Emotionally abused: Not on file    Physically abused: Not on file  Forced sexual activity: Not on file  Other Topics Concern  . Not on file  Social History Narrative   Household -- pt and wife      Allergies as of 05/01/2019   No Known Allergies     Medication List       Accurate as of May 01, 2019 11:59 PM. If you have any questions, ask your nurse or doctor.        atorvastatin 20 MG tablet Commonly known as: LIPITOR Take 1 tablet (20 mg total) by mouth daily.   CALCIUM 1200 PO Take by mouth.   losartan 50 MG tablet Commonly known as: COZAAR Take 1 tablet (50 mg total) by mouth daily.   multivitamin with minerals Tabs tablet Take 1 tablet by mouth daily. Centrum   tamsulosin 0.4 MG Caps capsule Commonly known as: FLOMAX Take 0.4 mg by mouth daily after supper.   Vitamin B-12 5000 MCG Tbdp Take 5,000 mcg by mouth daily.    vitamin C 1000 MG tablet Take 2,000 mg by mouth daily.   Vitamin D3 50 MCG (2000 UT) Tabs Take 2,000 Units by mouth daily.   vitamin E 400 UNIT capsule Take 800 Units by mouth daily.           Objective:   Physical Exam BP (!) 119/56 (BP Location: Left Arm, Patient Position: Sitting, Cuff Size: Small)   Pulse 64   Temp 98.2 F (36.8 C) (Oral)   Resp 16   Ht 5\' 11"  (1.803 m)   Wt 216 lb 6 oz (98.1 kg)   SpO2 99%   BMI 30.18 kg/m  General:   Well developed, NAD, BMI noted.  HEENT:  Normocephalic . Face symmetric, atraumatic Lungs:  CTA B Normal respiratory effort, no intercostal retractions, no accessory muscle use. Heart: RRR,  no murmur.  no pretibial edema bilaterally  Abdomen:  Not distended, soft, non-tender. No rebound or rigidity.   Skin: Not pale. Not jaundice Neurologic:  alert & oriented X3.  Speech normal, gait appropriate for age and unassisted Psych--  Cognition and judgment appear intact.  Cooperative with normal attention span and concentration.  Behavior appropriate. No anxious or depressed appearing.     Assessment      Assessment Prediabetes HTN ([facility] ~ 01/2018) Hyperlipidemia DJD  Allergies PUD per pt, dx early 2000s, related to ASA, was told not to take Prostate cancer: DX 12-2018, UroLift, to start external radiation 04/2019  PLAN Prostate cancer: Since the last office visit, prostate cancer was diagnosed, he had UroLift in place, will start external radiation soon.  He remains optimistic and in good spirits Prediabetes: Check A1c. HTN: Continue with Losartan.  BP today is very good, ambulatory BPs 125, 135.  We will get a CMP and CBC. High cholesterol: On Lipitor, check FLP. Preventive care: Had a colonoscopy 04/2019, next per GI Encouraged to have early flu shot this season  Continue with his COVID-19 precautions RTC 6 months CPX

## 2019-05-02 ENCOUNTER — Ambulatory Visit
Admission: RE | Admit: 2019-05-02 | Discharge: 2019-05-02 | Disposition: A | Discharge status: Home / Self Care | Payer: Medicare Other | Source: Ambulatory Visit | Attending: Radiation Oncology | Admitting: Radiation Oncology

## 2019-05-02 ENCOUNTER — Other Ambulatory Visit: Payer: Self-pay

## 2019-05-02 ENCOUNTER — Ambulatory Visit (HOSPITAL_COMMUNITY)
Admission: RE | Admit: 2019-05-02 | Discharge: 2019-05-02 | Disposition: A | Discharge status: Home / Self Care | Payer: Medicare Other | Source: Ambulatory Visit | Attending: Urology | Admitting: Urology

## 2019-05-02 DIAGNOSIS — Z510 Encounter for antineoplastic radiation therapy: Secondary | ICD-10-CM | POA: Diagnosis not present

## 2019-05-02 DIAGNOSIS — C61 Malignant neoplasm of prostate: Secondary | ICD-10-CM | POA: Diagnosis present

## 2019-05-02 DIAGNOSIS — R3914 Feeling of incomplete bladder emptying: Secondary | ICD-10-CM | POA: Diagnosis not present

## 2019-05-02 NOTE — Assessment & Plan Note (Signed)
Prostate cancer: Since the last office visit, prostate cancer was diagnosed, he had UroLift in place, will start external radiation soon.  He remains optimistic and in good spirits Prediabetes: Check A1c. HTN: Continue with Losartan.  BP today is very good, ambulatory BPs 125, 135.  We will get a CMP and CBC. High cholesterol: On Lipitor, check FLP. Preventive care: Had a colonoscopy 04/2019, next per GI Encouraged to have early flu shot this season  Continue with his COVID-19 precautions RTC 6 months CPX

## 2019-05-06 DIAGNOSIS — Z510 Encounter for antineoplastic radiation therapy: Secondary | ICD-10-CM | POA: Diagnosis not present

## 2019-05-06 DIAGNOSIS — C61 Malignant neoplasm of prostate: Secondary | ICD-10-CM | POA: Diagnosis not present

## 2019-05-07 DIAGNOSIS — R3914 Feeling of incomplete bladder emptying: Secondary | ICD-10-CM | POA: Diagnosis not present

## 2019-05-07 NOTE — Telephone Encounter (Signed)
Hospital Procedure done 04-21-2019.

## 2019-05-14 ENCOUNTER — Ambulatory Visit
Admission: RE | Admit: 2019-05-14 | Discharge: 2019-05-14 | Disposition: A | Discharge status: Home / Self Care | Payer: Medicare Other | Source: Ambulatory Visit | Attending: Radiation Oncology | Admitting: Radiation Oncology

## 2019-05-14 ENCOUNTER — Other Ambulatory Visit: Payer: Self-pay

## 2019-05-14 DIAGNOSIS — C61 Malignant neoplasm of prostate: Secondary | ICD-10-CM | POA: Diagnosis present

## 2019-05-14 DIAGNOSIS — Z510 Encounter for antineoplastic radiation therapy: Secondary | ICD-10-CM | POA: Insufficient documentation

## 2019-05-15 ENCOUNTER — Other Ambulatory Visit: Payer: Self-pay

## 2019-05-15 ENCOUNTER — Ambulatory Visit
Admission: RE | Admit: 2019-05-15 | Discharge: 2019-05-15 | Disposition: A | Discharge status: Home / Self Care | Payer: Medicare Other | Source: Ambulatory Visit | Attending: Radiation Oncology | Admitting: Radiation Oncology

## 2019-05-15 DIAGNOSIS — Z510 Encounter for antineoplastic radiation therapy: Secondary | ICD-10-CM | POA: Diagnosis not present

## 2019-05-15 DIAGNOSIS — C61 Malignant neoplasm of prostate: Secondary | ICD-10-CM | POA: Diagnosis not present

## 2019-05-15 NOTE — Progress Notes (Signed)
[facility] Therapy Treatment Record Phone [phone]   Radiation Therapy was administered to Martin Chase on: 05/15/2019  2:42 PM and was treatment # 2 out of a planned course of 10 treatments.  Radiation Treatment  1). Beam photons with 6-10 energy  2). Brachytherapy None  3). Stereotactic Radiosurgery None  4). Other Radiation None     Martin Chase, RT (T)

## 2019-05-16 ENCOUNTER — Ambulatory Visit
Admission: RE | Admit: 2019-05-16 | Discharge: 2019-05-16 | Disposition: A | Discharge status: Home / Self Care | Payer: Medicare Other | Source: Ambulatory Visit | Attending: Radiation Oncology | Admitting: Radiation Oncology

## 2019-05-16 ENCOUNTER — Other Ambulatory Visit: Payer: Self-pay

## 2019-05-16 DIAGNOSIS — C61 Malignant neoplasm of prostate: Secondary | ICD-10-CM | POA: Diagnosis not present

## 2019-05-16 DIAGNOSIS — Z510 Encounter for antineoplastic radiation therapy: Secondary | ICD-10-CM | POA: Diagnosis not present

## 2019-05-19 ENCOUNTER — Ambulatory Visit
Admission: RE | Admit: 2019-05-19 | Discharge: 2019-05-19 | Disposition: A | Discharge status: Home / Self Care | Payer: Medicare Other | Source: Ambulatory Visit | Attending: Radiation Oncology | Admitting: Radiation Oncology

## 2019-05-19 ENCOUNTER — Other Ambulatory Visit: Payer: Self-pay

## 2019-05-19 DIAGNOSIS — Z510 Encounter for antineoplastic radiation therapy: Secondary | ICD-10-CM | POA: Diagnosis not present

## 2019-05-19 DIAGNOSIS — C61 Malignant neoplasm of prostate: Secondary | ICD-10-CM | POA: Diagnosis not present

## 2019-05-20 ENCOUNTER — Other Ambulatory Visit: Payer: Self-pay

## 2019-05-20 ENCOUNTER — Ambulatory Visit
Admission: RE | Admit: 2019-05-20 | Discharge: 2019-05-20 | Disposition: A | Discharge status: Home / Self Care | Payer: Medicare Other | Source: Ambulatory Visit | Attending: Radiation Oncology | Admitting: Radiation Oncology

## 2019-05-20 DIAGNOSIS — C61 Malignant neoplasm of prostate: Secondary | ICD-10-CM | POA: Diagnosis not present

## 2019-05-20 DIAGNOSIS — Z510 Encounter for antineoplastic radiation therapy: Secondary | ICD-10-CM | POA: Diagnosis not present

## 2019-05-21 ENCOUNTER — Ambulatory Visit
Admission: RE | Admit: 2019-05-21 | Discharge: 2019-05-21 | Disposition: A | Discharge status: Home / Self Care | Payer: Medicare Other | Source: Ambulatory Visit | Attending: Radiation Oncology | Admitting: Radiation Oncology

## 2019-05-21 ENCOUNTER — Other Ambulatory Visit: Payer: Self-pay

## 2019-05-21 DIAGNOSIS — R3914 Feeling of incomplete bladder emptying: Secondary | ICD-10-CM | POA: Diagnosis not present

## 2019-05-21 DIAGNOSIS — C61 Malignant neoplasm of prostate: Secondary | ICD-10-CM | POA: Diagnosis not present

## 2019-05-21 DIAGNOSIS — Z510 Encounter for antineoplastic radiation therapy: Secondary | ICD-10-CM | POA: Diagnosis not present

## 2019-05-22 ENCOUNTER — Other Ambulatory Visit: Payer: Self-pay

## 2019-05-22 ENCOUNTER — Ambulatory Visit
Admission: RE | Admit: 2019-05-22 | Discharge: 2019-05-22 | Disposition: A | Discharge status: Home / Self Care | Payer: Medicare Other | Source: Ambulatory Visit | Attending: Radiation Oncology | Admitting: Radiation Oncology

## 2019-05-22 DIAGNOSIS — Z510 Encounter for antineoplastic radiation therapy: Secondary | ICD-10-CM | POA: Diagnosis not present

## 2019-05-22 DIAGNOSIS — C61 Malignant neoplasm of prostate: Secondary | ICD-10-CM | POA: Diagnosis not present

## 2019-05-23 ENCOUNTER — Ambulatory Visit
Admission: RE | Admit: 2019-05-23 | Discharge: 2019-05-23 | Disposition: A | Discharge status: Home / Self Care | Payer: Medicare Other | Source: Ambulatory Visit | Attending: Radiation Oncology | Admitting: Radiation Oncology

## 2019-05-23 ENCOUNTER — Other Ambulatory Visit: Payer: Self-pay

## 2019-05-23 DIAGNOSIS — Z510 Encounter for antineoplastic radiation therapy: Secondary | ICD-10-CM | POA: Diagnosis not present

## 2019-05-23 DIAGNOSIS — C61 Malignant neoplasm of prostate: Secondary | ICD-10-CM | POA: Diagnosis not present

## 2019-05-26 ENCOUNTER — Other Ambulatory Visit: Payer: Self-pay

## 2019-05-26 ENCOUNTER — Ambulatory Visit
Admission: RE | Admit: 2019-05-26 | Discharge: 2019-05-26 | Disposition: A | Discharge status: Home / Self Care | Payer: Medicare Other | Source: Ambulatory Visit | Attending: Radiation Oncology | Admitting: Radiation Oncology

## 2019-05-26 DIAGNOSIS — Z510 Encounter for antineoplastic radiation therapy: Secondary | ICD-10-CM | POA: Diagnosis not present

## 2019-05-26 DIAGNOSIS — C61 Malignant neoplasm of prostate: Secondary | ICD-10-CM | POA: Diagnosis not present

## 2019-05-27 ENCOUNTER — Other Ambulatory Visit: Payer: Self-pay

## 2019-05-27 ENCOUNTER — Ambulatory Visit
Admission: RE | Admit: 2019-05-27 | Discharge: 2019-05-27 | Disposition: A | Discharge status: Home / Self Care | Payer: Medicare Other | Source: Ambulatory Visit | Attending: Radiation Oncology | Admitting: Radiation Oncology

## 2019-05-27 DIAGNOSIS — Z510 Encounter for antineoplastic radiation therapy: Secondary | ICD-10-CM | POA: Diagnosis not present

## 2019-05-27 DIAGNOSIS — C61 Malignant neoplasm of prostate: Secondary | ICD-10-CM | POA: Diagnosis not present

## 2019-05-28 ENCOUNTER — Ambulatory Visit
Admission: RE | Admit: 2019-05-28 | Discharge: 2019-05-28 | Disposition: A | Discharge status: Home / Self Care | Payer: Medicare Other | Source: Ambulatory Visit | Attending: Radiation Oncology | Admitting: Radiation Oncology

## 2019-05-28 ENCOUNTER — Other Ambulatory Visit: Payer: Self-pay

## 2019-05-28 DIAGNOSIS — Z510 Encounter for antineoplastic radiation therapy: Secondary | ICD-10-CM | POA: Diagnosis not present

## 2019-05-28 DIAGNOSIS — C61 Malignant neoplasm of prostate: Secondary | ICD-10-CM | POA: Diagnosis not present

## 2019-05-29 ENCOUNTER — Ambulatory Visit
Admission: RE | Admit: 2019-05-29 | Discharge: 2019-05-29 | Disposition: A | Discharge status: Home / Self Care | Payer: Medicare Other | Source: Ambulatory Visit | Attending: Radiation Oncology | Admitting: Radiation Oncology

## 2019-05-29 ENCOUNTER — Other Ambulatory Visit: Payer: Self-pay

## 2019-05-29 DIAGNOSIS — Z510 Encounter for antineoplastic radiation therapy: Secondary | ICD-10-CM | POA: Diagnosis not present

## 2019-05-29 DIAGNOSIS — C61 Malignant neoplasm of prostate: Secondary | ICD-10-CM | POA: Diagnosis not present

## 2019-05-30 ENCOUNTER — Ambulatory Visit
Admission: RE | Admit: 2019-05-30 | Discharge: 2019-05-30 | Disposition: A | Discharge status: Home / Self Care | Payer: Medicare Other | Source: Ambulatory Visit | Attending: Radiation Oncology | Admitting: Radiation Oncology

## 2019-05-30 ENCOUNTER — Other Ambulatory Visit: Payer: Self-pay

## 2019-05-30 DIAGNOSIS — C61 Malignant neoplasm of prostate: Secondary | ICD-10-CM | POA: Diagnosis not present

## 2019-05-30 DIAGNOSIS — Z510 Encounter for antineoplastic radiation therapy: Secondary | ICD-10-CM | POA: Diagnosis not present

## 2019-06-02 ENCOUNTER — Ambulatory Visit
Admission: RE | Admit: 2019-06-02 | Discharge: 2019-06-02 | Disposition: A | Discharge status: Home / Self Care | Payer: Medicare Other | Source: Ambulatory Visit | Attending: Radiation Oncology | Admitting: Radiation Oncology

## 2019-06-02 ENCOUNTER — Other Ambulatory Visit: Payer: Self-pay

## 2019-06-02 DIAGNOSIS — C61 Malignant neoplasm of prostate: Secondary | ICD-10-CM | POA: Diagnosis not present

## 2019-06-02 DIAGNOSIS — Z510 Encounter for antineoplastic radiation therapy: Secondary | ICD-10-CM | POA: Diagnosis not present

## 2019-06-03 ENCOUNTER — Ambulatory Visit
Admission: RE | Admit: 2019-06-03 | Discharge: 2019-06-03 | Disposition: A | Discharge status: Home / Self Care | Payer: Medicare Other | Source: Ambulatory Visit | Attending: Radiation Oncology | Admitting: Radiation Oncology

## 2019-06-03 ENCOUNTER — Other Ambulatory Visit: Payer: Self-pay

## 2019-06-03 DIAGNOSIS — Z510 Encounter for antineoplastic radiation therapy: Secondary | ICD-10-CM | POA: Diagnosis not present

## 2019-06-03 DIAGNOSIS — C61 Malignant neoplasm of prostate: Secondary | ICD-10-CM | POA: Diagnosis not present

## 2019-06-04 ENCOUNTER — Ambulatory Visit
Admission: RE | Admit: 2019-06-04 | Discharge: 2019-06-04 | Disposition: A | Discharge status: Home / Self Care | Payer: Medicare Other | Source: Ambulatory Visit | Attending: Radiation Oncology | Admitting: Radiation Oncology

## 2019-06-04 ENCOUNTER — Other Ambulatory Visit: Payer: Self-pay

## 2019-06-04 DIAGNOSIS — R3914 Feeling of incomplete bladder emptying: Secondary | ICD-10-CM | POA: Diagnosis not present

## 2019-06-04 DIAGNOSIS — Z510 Encounter for antineoplastic radiation therapy: Secondary | ICD-10-CM | POA: Diagnosis not present

## 2019-06-04 DIAGNOSIS — C61 Malignant neoplasm of prostate: Secondary | ICD-10-CM | POA: Diagnosis not present

## 2019-06-05 ENCOUNTER — Other Ambulatory Visit: Payer: Self-pay

## 2019-06-05 ENCOUNTER — Ambulatory Visit
Admission: RE | Admit: 2019-06-05 | Discharge: 2019-06-05 | Disposition: A | Discharge status: Home / Self Care | Payer: Medicare Other | Source: Ambulatory Visit | Attending: Radiation Oncology | Admitting: Radiation Oncology

## 2019-06-05 DIAGNOSIS — C61 Malignant neoplasm of prostate: Secondary | ICD-10-CM | POA: Diagnosis not present

## 2019-06-05 DIAGNOSIS — Z510 Encounter for antineoplastic radiation therapy: Secondary | ICD-10-CM | POA: Diagnosis not present

## 2019-06-06 ENCOUNTER — Other Ambulatory Visit: Payer: Self-pay

## 2019-06-06 ENCOUNTER — Ambulatory Visit
Admission: RE | Admit: 2019-06-06 | Discharge: 2019-06-06 | Disposition: A | Discharge status: Home / Self Care | Payer: Medicare Other | Source: Ambulatory Visit | Attending: Radiation Oncology | Admitting: Radiation Oncology

## 2019-06-06 DIAGNOSIS — Z510 Encounter for antineoplastic radiation therapy: Secondary | ICD-10-CM | POA: Diagnosis not present

## 2019-06-06 DIAGNOSIS — C61 Malignant neoplasm of prostate: Secondary | ICD-10-CM | POA: Diagnosis not present

## 2019-06-09 ENCOUNTER — Ambulatory Visit
Admission: RE | Admit: 2019-06-09 | Discharge: 2019-06-09 | Disposition: A | Discharge status: Home / Self Care | Payer: Medicare Other | Source: Ambulatory Visit | Attending: Radiation Oncology | Admitting: Radiation Oncology

## 2019-06-09 ENCOUNTER — Other Ambulatory Visit: Payer: Self-pay

## 2019-06-09 DIAGNOSIS — C61 Malignant neoplasm of prostate: Secondary | ICD-10-CM | POA: Insufficient documentation

## 2019-06-09 DIAGNOSIS — Z510 Encounter for antineoplastic radiation therapy: Secondary | ICD-10-CM | POA: Diagnosis not present

## 2019-06-09 DIAGNOSIS — R3914 Feeling of incomplete bladder emptying: Secondary | ICD-10-CM | POA: Diagnosis not present

## 2019-06-10 ENCOUNTER — Ambulatory Visit
Admission: RE | Admit: 2019-06-10 | Discharge: 2019-06-10 | Disposition: A | Discharge status: Home / Self Care | Payer: Medicare Other | Source: Ambulatory Visit | Attending: Radiation Oncology | Admitting: Radiation Oncology

## 2019-06-10 ENCOUNTER — Other Ambulatory Visit: Payer: Self-pay

## 2019-06-10 DIAGNOSIS — C61 Malignant neoplasm of prostate: Secondary | ICD-10-CM | POA: Diagnosis not present

## 2019-06-10 DIAGNOSIS — Z510 Encounter for antineoplastic radiation therapy: Secondary | ICD-10-CM | POA: Diagnosis not present

## 2019-06-11 ENCOUNTER — Other Ambulatory Visit: Payer: Self-pay

## 2019-06-11 ENCOUNTER — Ambulatory Visit
Admission: RE | Admit: 2019-06-11 | Discharge: 2019-06-11 | Disposition: A | Discharge status: Home / Self Care | Payer: Medicare Other | Source: Ambulatory Visit | Attending: Radiation Oncology | Admitting: Radiation Oncology

## 2019-06-11 DIAGNOSIS — C61 Malignant neoplasm of prostate: Secondary | ICD-10-CM | POA: Diagnosis not present

## 2019-06-11 DIAGNOSIS — Z510 Encounter for antineoplastic radiation therapy: Secondary | ICD-10-CM | POA: Diagnosis not present

## 2019-06-12 ENCOUNTER — Ambulatory Visit
Admission: RE | Admit: 2019-06-12 | Discharge: 2019-06-12 | Disposition: A | Discharge status: Home / Self Care | Payer: Medicare Other | Source: Ambulatory Visit | Attending: Radiation Oncology | Admitting: Radiation Oncology

## 2019-06-12 ENCOUNTER — Other Ambulatory Visit: Payer: Self-pay

## 2019-06-12 DIAGNOSIS — Z510 Encounter for antineoplastic radiation therapy: Secondary | ICD-10-CM | POA: Diagnosis not present

## 2019-06-12 DIAGNOSIS — C61 Malignant neoplasm of prostate: Secondary | ICD-10-CM | POA: Diagnosis not present

## 2019-06-13 ENCOUNTER — Ambulatory Visit
Admission: RE | Admit: 2019-06-13 | Discharge: 2019-06-13 | Disposition: A | Discharge status: Home / Self Care | Payer: Medicare Other | Source: Ambulatory Visit | Attending: Radiation Oncology | Admitting: Radiation Oncology

## 2019-06-13 ENCOUNTER — Other Ambulatory Visit: Payer: Self-pay

## 2019-06-13 DIAGNOSIS — Z510 Encounter for antineoplastic radiation therapy: Secondary | ICD-10-CM | POA: Diagnosis not present

## 2019-06-13 DIAGNOSIS — C61 Malignant neoplasm of prostate: Secondary | ICD-10-CM | POA: Diagnosis not present

## 2019-06-16 ENCOUNTER — Ambulatory Visit
Admission: RE | Admit: 2019-06-16 | Discharge: 2019-06-16 | Disposition: A | Discharge status: Home / Self Care | Payer: Medicare Other | Source: Ambulatory Visit | Attending: Radiation Oncology | Admitting: Radiation Oncology

## 2019-06-16 ENCOUNTER — Other Ambulatory Visit: Payer: Self-pay

## 2019-06-16 DIAGNOSIS — Z510 Encounter for antineoplastic radiation therapy: Secondary | ICD-10-CM | POA: Diagnosis not present

## 2019-06-16 DIAGNOSIS — C61 Malignant neoplasm of prostate: Secondary | ICD-10-CM | POA: Diagnosis not present

## 2019-06-17 ENCOUNTER — Other Ambulatory Visit: Payer: Self-pay

## 2019-06-17 ENCOUNTER — Ambulatory Visit
Admission: RE | Admit: 2019-06-17 | Discharge: 2019-06-17 | Disposition: A | Discharge status: Home / Self Care | Payer: Medicare Other | Source: Ambulatory Visit | Attending: Radiation Oncology | Admitting: Radiation Oncology

## 2019-06-17 DIAGNOSIS — Z510 Encounter for antineoplastic radiation therapy: Secondary | ICD-10-CM | POA: Diagnosis not present

## 2019-06-17 DIAGNOSIS — C61 Malignant neoplasm of prostate: Secondary | ICD-10-CM | POA: Diagnosis not present

## 2019-06-18 ENCOUNTER — Ambulatory Visit
Admission: RE | Admit: 2019-06-18 | Discharge: 2019-06-18 | Disposition: A | Discharge status: Home / Self Care | Payer: Medicare Other | Source: Ambulatory Visit | Attending: Radiation Oncology | Admitting: Radiation Oncology

## 2019-06-18 ENCOUNTER — Other Ambulatory Visit: Payer: Self-pay

## 2019-06-18 DIAGNOSIS — Z510 Encounter for antineoplastic radiation therapy: Secondary | ICD-10-CM | POA: Diagnosis not present

## 2019-06-18 DIAGNOSIS — C61 Malignant neoplasm of prostate: Secondary | ICD-10-CM | POA: Diagnosis not present

## 2019-06-19 ENCOUNTER — Other Ambulatory Visit: Payer: Self-pay

## 2019-06-19 ENCOUNTER — Ambulatory Visit
Admission: RE | Admit: 2019-06-19 | Discharge: 2019-06-19 | Disposition: A | Discharge status: Home / Self Care | Payer: Medicare Other | Source: Ambulatory Visit | Attending: Radiation Oncology | Admitting: Radiation Oncology

## 2019-06-19 DIAGNOSIS — C61 Malignant neoplasm of prostate: Secondary | ICD-10-CM | POA: Diagnosis not present

## 2019-06-19 DIAGNOSIS — Z510 Encounter for antineoplastic radiation therapy: Secondary | ICD-10-CM | POA: Diagnosis not present

## 2019-06-20 ENCOUNTER — Ambulatory Visit
Admission: RE | Admit: 2019-06-20 | Discharge: 2019-06-20 | Disposition: A | Discharge status: Home / Self Care | Payer: Medicare Other | Source: Ambulatory Visit | Attending: Radiation Oncology | Admitting: Radiation Oncology

## 2019-06-20 ENCOUNTER — Other Ambulatory Visit: Payer: Self-pay

## 2019-06-20 DIAGNOSIS — Z510 Encounter for antineoplastic radiation therapy: Secondary | ICD-10-CM | POA: Diagnosis not present

## 2019-06-20 DIAGNOSIS — C61 Malignant neoplasm of prostate: Secondary | ICD-10-CM | POA: Diagnosis not present

## 2019-06-23 ENCOUNTER — Other Ambulatory Visit: Payer: Self-pay

## 2019-06-23 ENCOUNTER — Ambulatory Visit
Admission: RE | Admit: 2019-06-23 | Discharge: 2019-06-23 | Disposition: A | Discharge status: Home / Self Care | Payer: Medicare Other | Source: Ambulatory Visit | Attending: Radiation Oncology | Admitting: Radiation Oncology

## 2019-06-23 DIAGNOSIS — Z510 Encounter for antineoplastic radiation therapy: Secondary | ICD-10-CM | POA: Diagnosis not present

## 2019-06-23 DIAGNOSIS — C61 Malignant neoplasm of prostate: Secondary | ICD-10-CM | POA: Diagnosis not present

## 2019-06-24 ENCOUNTER — Ambulatory Visit
Admission: RE | Admit: 2019-06-24 | Discharge: 2019-06-24 | Disposition: A | Discharge status: Home / Self Care | Payer: Medicare Other | Source: Ambulatory Visit | Attending: Radiation Oncology | Admitting: Radiation Oncology

## 2019-06-24 ENCOUNTER — Other Ambulatory Visit: Payer: Self-pay

## 2019-06-24 DIAGNOSIS — Z510 Encounter for antineoplastic radiation therapy: Secondary | ICD-10-CM | POA: Diagnosis not present

## 2019-06-24 DIAGNOSIS — C61 Malignant neoplasm of prostate: Secondary | ICD-10-CM | POA: Diagnosis not present

## 2019-06-25 ENCOUNTER — Ambulatory Visit
Admission: RE | Admit: 2019-06-25 | Discharge: 2019-06-25 | Disposition: A | Discharge status: Home / Self Care | Payer: Medicare Other | Source: Ambulatory Visit | Attending: Radiation Oncology | Admitting: Radiation Oncology

## 2019-06-25 ENCOUNTER — Other Ambulatory Visit: Payer: Self-pay

## 2019-06-25 DIAGNOSIS — Z510 Encounter for antineoplastic radiation therapy: Secondary | ICD-10-CM | POA: Diagnosis not present

## 2019-06-25 DIAGNOSIS — C61 Malignant neoplasm of prostate: Secondary | ICD-10-CM | POA: Diagnosis not present

## 2019-06-26 ENCOUNTER — Other Ambulatory Visit: Payer: Self-pay

## 2019-06-26 ENCOUNTER — Ambulatory Visit
Admission: RE | Admit: 2019-06-26 | Discharge: 2019-06-26 | Disposition: A | Discharge status: Home / Self Care | Payer: Medicare Other | Source: Ambulatory Visit | Attending: Radiation Oncology | Admitting: Radiation Oncology

## 2019-06-26 DIAGNOSIS — C61 Malignant neoplasm of prostate: Secondary | ICD-10-CM | POA: Diagnosis not present

## 2019-06-26 DIAGNOSIS — Z510 Encounter for antineoplastic radiation therapy: Secondary | ICD-10-CM | POA: Diagnosis not present

## 2019-06-27 ENCOUNTER — Other Ambulatory Visit: Payer: Self-pay

## 2019-06-27 ENCOUNTER — Ambulatory Visit
Admission: RE | Admit: 2019-06-27 | Discharge: 2019-06-27 | Disposition: A | Discharge status: Home / Self Care | Payer: Medicare Other | Source: Ambulatory Visit | Attending: Radiation Oncology | Admitting: Radiation Oncology

## 2019-06-27 DIAGNOSIS — C61 Malignant neoplasm of prostate: Secondary | ICD-10-CM | POA: Diagnosis not present

## 2019-06-27 DIAGNOSIS — Z510 Encounter for antineoplastic radiation therapy: Secondary | ICD-10-CM | POA: Diagnosis not present

## 2019-06-30 ENCOUNTER — Other Ambulatory Visit: Payer: Self-pay

## 2019-06-30 ENCOUNTER — Ambulatory Visit
Admission: RE | Admit: 2019-06-30 | Discharge: 2019-06-30 | Disposition: A | Discharge status: Home / Self Care | Payer: Medicare Other | Source: Ambulatory Visit | Attending: Radiation Oncology | Admitting: Radiation Oncology

## 2019-06-30 DIAGNOSIS — C61 Malignant neoplasm of prostate: Secondary | ICD-10-CM | POA: Diagnosis not present

## 2019-06-30 DIAGNOSIS — Z510 Encounter for antineoplastic radiation therapy: Secondary | ICD-10-CM | POA: Diagnosis not present

## 2019-07-01 ENCOUNTER — Ambulatory Visit
Admission: RE | Admit: 2019-07-01 | Discharge: 2019-07-01 | Disposition: A | Discharge status: Home / Self Care | Payer: Medicare Other | Source: Ambulatory Visit | Attending: Radiation Oncology | Admitting: Radiation Oncology

## 2019-07-01 ENCOUNTER — Other Ambulatory Visit: Payer: Self-pay

## 2019-07-01 DIAGNOSIS — C61 Malignant neoplasm of prostate: Secondary | ICD-10-CM | POA: Diagnosis not present

## 2019-07-01 DIAGNOSIS — Z510 Encounter for antineoplastic radiation therapy: Secondary | ICD-10-CM | POA: Diagnosis not present

## 2019-07-02 ENCOUNTER — Ambulatory Visit
Admission: RE | Admit: 2019-07-02 | Discharge: 2019-07-02 | Disposition: A | Discharge status: Home / Self Care | Payer: Medicare Other | Source: Ambulatory Visit | Attending: Radiation Oncology | Admitting: Radiation Oncology

## 2019-07-02 ENCOUNTER — Other Ambulatory Visit: Payer: Self-pay

## 2019-07-02 DIAGNOSIS — Z510 Encounter for antineoplastic radiation therapy: Secondary | ICD-10-CM | POA: Diagnosis not present

## 2019-07-02 DIAGNOSIS — C61 Malignant neoplasm of prostate: Secondary | ICD-10-CM | POA: Diagnosis not present

## 2019-07-03 ENCOUNTER — Ambulatory Visit
Admission: RE | Admit: 2019-07-03 | Discharge: 2019-07-03 | Disposition: A | Discharge status: Home / Self Care | Payer: Medicare Other | Source: Ambulatory Visit | Attending: Radiation Oncology | Admitting: Radiation Oncology

## 2019-07-03 ENCOUNTER — Other Ambulatory Visit: Payer: Self-pay

## 2019-07-03 DIAGNOSIS — Z510 Encounter for antineoplastic radiation therapy: Secondary | ICD-10-CM | POA: Diagnosis not present

## 2019-07-03 DIAGNOSIS — C61 Malignant neoplasm of prostate: Secondary | ICD-10-CM | POA: Diagnosis not present

## 2019-07-04 ENCOUNTER — Other Ambulatory Visit: Payer: Self-pay

## 2019-07-04 ENCOUNTER — Ambulatory Visit
Admission: RE | Admit: 2019-07-04 | Discharge: 2019-07-04 | Disposition: A | Discharge status: Home / Self Care | Payer: Medicare Other | Source: Ambulatory Visit | Attending: Radiation Oncology | Admitting: Radiation Oncology

## 2019-07-04 DIAGNOSIS — C61 Malignant neoplasm of prostate: Secondary | ICD-10-CM | POA: Diagnosis not present

## 2019-07-04 DIAGNOSIS — Z510 Encounter for antineoplastic radiation therapy: Secondary | ICD-10-CM | POA: Diagnosis not present

## 2019-07-07 ENCOUNTER — Other Ambulatory Visit: Payer: Self-pay

## 2019-07-07 ENCOUNTER — Ambulatory Visit
Admission: RE | Admit: 2019-07-07 | Discharge: 2019-07-07 | Disposition: A | Discharge status: Home / Self Care | Payer: Medicare Other | Source: Ambulatory Visit | Attending: Radiation Oncology | Admitting: Radiation Oncology

## 2019-07-07 DIAGNOSIS — Z510 Encounter for antineoplastic radiation therapy: Secondary | ICD-10-CM | POA: Diagnosis not present

## 2019-07-07 DIAGNOSIS — C61 Malignant neoplasm of prostate: Secondary | ICD-10-CM | POA: Diagnosis not present

## 2019-07-08 ENCOUNTER — Other Ambulatory Visit: Payer: Self-pay

## 2019-07-08 ENCOUNTER — Ambulatory Visit
Admission: RE | Admit: 2019-07-08 | Discharge: 2019-07-08 | Disposition: A | Discharge status: Home / Self Care | Payer: Medicare Other | Source: Ambulatory Visit | Attending: Radiation Oncology | Admitting: Radiation Oncology

## 2019-07-08 ENCOUNTER — Encounter: Payer: Self-pay | Admitting: Radiation Oncology

## 2019-07-08 DIAGNOSIS — Z510 Encounter for antineoplastic radiation therapy: Secondary | ICD-10-CM | POA: Insufficient documentation

## 2019-07-08 DIAGNOSIS — C61 Malignant neoplasm of prostate: Secondary | ICD-10-CM | POA: Insufficient documentation

## 2019-07-11 NOTE — Progress Notes (Signed)
  Radiation Oncology         [phone] ________________________________  Name: Martin Chase MRN: [id number]  Date: 07/08/2019  DOB: 1945/11/20  End of Treatment Note  Diagnosis:   73 y.o. gentleman with stage T1c adenocarcinoma of the prostate with a Gleason's score of 4+4 and a PSA of 5.76     Indication for treatment:  Curative, Definitive Radiotherapy       Radiation treatment dates:   7/8-07/08/19  Site/dose:  1. The prostate, seminal vesicles, and pelvic lymph nodes were initially treated to 45 Gy in 25 fractions of 1.8 Gy  2. The prostate only was boosted to 75 Gy with 15 additional fractions of 2.0 Gy   Beams/energy:  1. The prostate, seminal vesicles, and pelvic lymph nodes were initially treated using VMAT intensity modulated radiotherapy delivering 6 megavolt photons. Image guidance was performed with CB-CT studies prior to each fraction. He was immobilized with a body fix lower extremity mold.  2. the prostate only was boosted using VMAT intensity modulated radiotherapy delivering 6 megavolt photons. Image guidance was performed with CB-CT studies prior to each fraction. He was immobilized with a body fix lower extremity mold.  Narrative: The patient tolerated radiation treatment relatively well.   The patient experienced some minor urinary irritation and modest fatigue.  He had nocturia x2, and moderate steady urine stream.  No complaints of dysuria, hematuria, or fatigue.  He feels he does not completely empty his bladder.  Bowels are moving regularly  Plan: The patient has completed radiation treatment. He will return to radiation oncology clinic for routine followup in one month. I advised him to call or return sooner if he has any questions or concerns related to his recovery or treatment. ________________________________  Sheral Apley. Tammi Klippel, M.D.

## 2019-07-21 ENCOUNTER — Encounter: Payer: Self-pay | Admitting: Pulmonary Disease

## 2019-07-21 ENCOUNTER — Other Ambulatory Visit: Payer: Self-pay

## 2019-07-21 ENCOUNTER — Ambulatory Visit: Payer: Medicare Other | Admitting: Pulmonary Disease

## 2019-07-21 VITALS — BP 116/64 | HR 71 | Temp 97.9°F | Ht 71.0 in | Wt 210.0 lb

## 2019-07-21 DIAGNOSIS — G4733 Obstructive sleep apnea (adult) (pediatric): Secondary | ICD-10-CM | POA: Diagnosis not present

## 2019-07-21 NOTE — Patient Instructions (Signed)
Obstructive sleep apnea, compliant with CPAP use  We will schedule you for home sleep study  I will see you back in the office in 3 months   Sleep Apnea Sleep apnea is a condition in which breathing pauses or becomes shallow during sleep. Episodes of sleep apnea usually last 10 seconds or longer, and they may occur as many as 20 times an hour. Sleep apnea disrupts your sleep and keeps your body from getting the rest that it needs. This condition can increase your risk of certain health problems, including:  Heart attack.  Stroke.  Obesity.  Diabetes.  Heart failure.  Irregular heartbeat. What are the causes? There are three kinds of sleep apnea:  Obstructive sleep apnea. This kind is caused by a blocked or collapsed airway.  Central sleep apnea. This kind happens when the part of the brain that controls breathing does not send the correct signals to the muscles that control breathing.  Mixed sleep apnea. This is a combination of obstructive and central sleep apnea. The most common cause of this condition is a collapsed or blocked airway. An airway can collapse or become blocked if:  Your throat muscles are abnormally relaxed.  Your tongue and tonsils are larger than normal.  You are overweight.  Your airway is smaller than normal. What increases the risk? You are more likely to develop this condition if you:  Are overweight.  Smoke.  Have a smaller than normal airway.  Are elderly.  Are male.  Drink alcohol.  Take sedatives or tranquilizers.  Have a family history of sleep apnea. What are the signs or symptoms? Symptoms of this condition include:  Trouble staying asleep.  Daytime sleepiness and tiredness.  Irritability.  Loud snoring.  Morning headaches.  Trouble concentrating.  Forgetfulness.  Decreased interest in sex.  Unexplained sleepiness.  Mood swings.  Personality changes.  Feelings of depression.  Waking up often during the  night to urinate.  Dry mouth.  Sore throat. How is this diagnosed? This condition may be diagnosed with:  A medical history.  A physical exam.  A series of tests that are done while you are sleeping (sleep study). These tests are usually done in a sleep lab, but they may also be done at home. How is this treated? Treatment for this condition aims to restore normal breathing and to ease symptoms during sleep. It may involve managing health issues that can affect breathing, such as high blood pressure or obesity. Treatment may include:  Sleeping on your side.  Using a decongestant if you have nasal congestion.  Avoiding the use of depressants, including alcohol, sedatives, and narcotics.  Losing weight if you are overweight.  Making changes to your diet.  Quitting smoking.  Using a device to open your airway while you sleep, such as: ? An oral appliance. This is a custom-made mouthpiece that shifts your lower jaw forward. ? A continuous positive airway pressure (CPAP) device. This device blows air through a mask when you breathe out (exhale). ? A nasal expiratory positive airway pressure (EPAP) device. This device has valves that you put into each nostril. ? A bi-level positive airway pressure (BPAP) device. This device blows air through a mask when you breathe in (inhale) and breathe out (exhale).  Having surgery if other treatments do not work. During surgery, excess tissue is removed to create a wider airway. It is important to get treatment for sleep apnea. Without treatment, this condition can lead to:  High blood pressure.  Coronary artery disease.  In men, an inability to achieve or maintain an erection (impotence).  Reduced thinking abilities. Follow these instructions at home: Lifestyle  Make any lifestyle changes that your health care provider recommends.  Eat a healthy, well-balanced diet.  Take steps to lose weight if you are overweight.  Avoid using  depressants, including alcohol, sedatives, and narcotics.  Do not use any products that contain nicotine or tobacco, such as cigarettes, e-cigarettes, and chewing tobacco. If you need help quitting, ask your health care provider. General instructions  Take over-the-counter and prescription medicines only as told by your health care provider.  If you were given a device to open your airway while you sleep, use it only as told by your health care provider.  If you are having surgery, make sure to tell your health care provider you have sleep apnea. You may need to bring your device with you.  Keep all follow-up visits as told by your health care provider. This is important. Contact a health care provider if:  The device that you received to open your airway during sleep is uncomfortable or does not seem to be working.  Your symptoms do not improve.  Your symptoms get worse. Get help right away if:  You develop: ? Chest pain. ? Shortness of breath. ? Discomfort in your back, arms, or stomach.  You have: ? Trouble speaking. ? Weakness on one side of your body. ? Drooping in your face. These symptoms may represent a serious problem that is an emergency. Do not wait to see if the symptoms will go away. Get medical help right away. Call your local emergency services (911 in the [place] Do not drive yourself to the hospital. Summary  Sleep apnea is a condition in which breathing pauses or becomes shallow during sleep.  The most common cause is a collapsed or blocked airway.  The goal of treatment is to restore normal breathing and to ease symptoms during sleep. This information is not intended to replace advice given to you by your health care provider. Make sure you discuss any questions you have with your health care provider. Document Released: 10/13/2002 Document Revised: 08/09/2018 Document Reviewed: 06/18/2018 Elsevier Patient Education  2020 [employer].

## 2019-07-21 NOTE — Progress Notes (Signed)
Subjective:     Patient ID: Martin Chase, male   DOB: 04-03-1946, 73 y.o.   MRN: [id number]  History of obstructive sleep apnea diagnosed about 10 years ago  Uses CPAP nightly   Continues to benefit from CPAP. Machine is dated Cannot find parts  Denies dryness of his mouth in the mornings No morning headaches Memory continues to be stable Weight is controlled  Usually goes to bed about 10 PM, final awakening time about 8 AM Wakes up about twice during the night to use the restroom  No family history of obstructive sleep apnea  Occasional difficulty staying asleep  Past Medical History:  Diagnosis Date  . Allergy   . Anemia 1996   due to a bleeding from aspirin  . Arthritis   . Cataract   . History of blood transfusion 1996  . HOH (hard of hearing)    uses hearing aids/ hearing on left side worse  . Hyperlipidemia   . Hypertension   . Prediabetes 10/20/2014  . Prostate cancer ([place])   . Sleep apnea    uses c-pap   Social History   Socioeconomic History  . Marital status: Married    Spouse name: Patsy  . Number of children: 0  . Years of education: Not on file  . Highest education level: Not on file  Occupational History  . Occupation: retired, [employer]  Social Needs  . Financial resource strain: Not on file  . Food insecurity    Worry: Not on file    Inability: Not on file  . Transportation needs    Medical: Not on file    Non-medical: Not on file  Tobacco Use  . Smoking status: Former Smoker    Years: 5.00    Types: Cigarettes    Quit date: 01/29/1986    Years since quitting: 33.4  . Smokeless tobacco: Never Used  Substance and Sexual Activity  . Alcohol use: Yes    Alcohol/week: 7.0 standard drinks    Types: 7 Glasses of wine per week    Comment: 1 glass of wine with dinner  . Drug use: No  . Sexual activity: Not Currently  Lifestyle  . Physical activity    Days per week: Not on file    Minutes per session: Not on file  .  Stress: Not on file  Relationships  . Social Herbalist on phone: Not on file    Gets together: Not on file    Attends religious service: Not on file    Active member of club or organization: Not on file    Attends meetings of clubs or organizations: Not on file    Relationship status: Not on file  . Intimate partner violence    Fear of current or ex partner: Not on file    Emotionally abused: Not on file    Physically abused: Not on file    Forced sexual activity: Not on file  Other Topics Concern  . Not on file  Social History Narrative   Household -- pt and wife   Family History  Problem Relation Age of Onset  . Diabetes Mother   . Alzheimer's disease Mother   . Prostate cancer Father 39       prostatectomy?  . Diabetes Maternal Uncle   . Stroke Paternal Uncle   . Diabetes Maternal Grandmother   . Heart disease Maternal Grandfather   . Suicidality Brother   . Colon cancer Neg Hx   .  Rectal cancer Neg Hx   . Esophageal cancer Neg Hx   . Stomach cancer Neg Hx      Review of Systems  Constitutional: Negative for fever and unexpected weight change.  HENT: Negative for congestion, dental problem, ear pain, nosebleeds, postnasal drip, rhinorrhea, sinus pressure, sneezing, sore throat and trouble swallowing.   Eyes: Negative for redness and itching.  Respiratory: Negative for cough, chest tightness, shortness of breath and wheezing.   Cardiovascular: Negative for palpitations and leg swelling.  Gastrointestinal: Negative for nausea and vomiting.  Genitourinary: Negative for dysuria.  Musculoskeletal: Negative for joint swelling.  Skin: Negative for rash.  Allergic/Immunologic: Negative.  Negative for environmental allergies, food allergies and immunocompromised state.  Neurological: Negative for headaches.  Hematological: Does not bruise/bleed easily.  Psychiatric/Behavioral: Negative for dysphoric mood. The patient is not nervous/anxious.        Objective:    Physical Exam Constitutional:      Appearance: Normal appearance.  HENT:     Head: Normocephalic and atraumatic.     Nose: Nose normal.     Mouth/Throat:     Mouth: Mucous membranes are moist.     Comments: Mallampati 3, crowded oropharynx Eyes:     General:        Right eye: No discharge.     Extraocular Movements: Extraocular movements intact.     Pupils: Pupils are equal, round, and reactive to light.  Neck:     Musculoskeletal: Normal range of motion and neck supple. No neck rigidity or muscular tenderness.  Cardiovascular:     Rate and Rhythm: Normal rate and regular rhythm.     Pulses: Normal pulses.     Heart sounds: Normal heart sounds. No murmur.  Pulmonary:     Effort: Pulmonary effort is normal. No respiratory distress.     Breath sounds: Normal breath sounds. No stridor. No wheezing or rhonchi.  Abdominal:     General: There is no distension.     Palpations: There is no mass.  Musculoskeletal: Normal range of motion.        General: No swelling or tenderness.  Skin:    General: Skin is warm and dry.  Neurological:     General: No focal deficit present.     Mental Status: He is alert.  Psychiatric:        Mood and Affect: Mood normal.    Results of the Epworth flowsheet 07/21/2019  Sitting and reading 3  Watching TV 3  Sitting, inactive in a public place (e.g. a theatre or a meeting) 0  As a passenger in a car for an hour without a break 0  Lying down to rest in the afternoon when circumstances permit 3  Sitting and talking to someone 0  Sitting quietly after a lunch without alcohol 0  In a car, while stopped for a few minutes in traffic 0  Total score 9      Assessment:     Obstructive sleep apnea -Compliant with CPAP -continues to benefit from CPAP  Obesity  Daytime sleepiness    Plan:     Pathophysiology of sleep disordered breathing discussed  Treatment options discussed  Will obtain a home sleep study to ascertain presence of  significant sleep disordered breathing  We will schedule patient for CPAP therapy-updated machine Auto titrating CPAP therapy will be appropriate  I will see the patient back in the office in about 3 months  Encouraged to call with any significant concerns

## 2019-07-30 ENCOUNTER — Ambulatory Visit: Payer: Medicare Other

## 2019-07-30 ENCOUNTER — Other Ambulatory Visit: Payer: Self-pay

## 2019-07-30 DIAGNOSIS — G4733 Obstructive sleep apnea (adult) (pediatric): Secondary | ICD-10-CM

## 2019-07-30 HISTORY — PX: COLONOSCOPY: SHX174

## 2019-08-05 NOTE — Progress Notes (Signed)
Virtual Visit via Video Note  I connected with patient on 08/07/19 at 10:00 AM EDT by audio enabled telemedicine application and verified that I am speaking with the correct person using two identifiers.   THIS ENCOUNTER IS A VIRTUAL VISIT DUE TO COVID-19 - PATIENT WAS NOT SEEN IN THE OFFICE. PATIENT HAS CONSENTED TO VIRTUAL VISIT / TELEMEDICINE VISIT   Location of patient: home  Location of provider: office  I discussed the limitations of evaluation and management by telemedicine and the availability of in person appointments. The patient expressed understanding and agreed to proceed.   Subjective:   Martin Chase is a 73 y.o. male who presents for Medicare Annual/Subsequent preventive examination.  Pt enjoys reading. His favorite is history.  Review of Systems:  Home Safety/Smoke Alarms: Feels safe in home. Smoke alarms in place.  Lives w/ wife in 1 story home w/ basement. 2 dogs.  Male:   CCS-04/21/19.  Recall 1 yrs.    PSA-  Lab Results  Component Value Date   PSA 5.76 11/11/2018   PSA 5.91 (H) 09/19/2018   PSA 5.44 (H) 03/04/2018       Objective:    Vitals: BP (!) 133/59 Comment: pt reported vitals  There is no height or weight on file to calculate BMI.  Advanced Directives 08/07/2019 09/16/2018 09/12/2018 08/05/2018 08/03/2017  Does Patient Have a Medical Advance Directive? No No No Yes Yes  Type of Advance Directive - - Public librarian;Living will [place];Living will  Copy of [place] in Chart? - - - No - copy requested No - copy requested  Would patient like information on creating a medical advance directive? No - Patient declined No - Patient declined - - -    Tobacco Social History   Tobacco Use  Smoking Status Former Smoker  . Years: 5.00  . Types: Cigarettes  . Quit date: 01/29/1986  . Years since quitting: 33.5  Smokeless Tobacco Never Used     Counseling given: Not Answered   Clinical Intake:  Pain : No/denies pain      Past Medical History:  Diagnosis Date  . Allergy   . Anemia 1996   due to a bleeding from aspirin  . Arthritis   . Cataract   . History of blood transfusion 1996  . HOH (hard of hearing)    uses hearing aids/ hearing on left side worse  . Hyperlipidemia   . Hypertension   . Prediabetes 10/20/2014  . Prostate cancer ([place])   . Sleep apnea    uses c-pap   Past Surgical History:  Procedure Laterality Date  . COLONOSCOPY WITH PROPOFOL N/A 09/16/2018   Procedure: COLONOSCOPY WITH PROPOFOL;  Surgeon: Yetta Flock, MD;  Location: [facility];  Service: Gastroenterology;  Laterality: N/A;  . POLYPECTOMY  09/16/2018   Procedure: POLYPECTOMY;  Surgeon: Yetta Flock, MD;  Location: [facility];  Service: Gastroenterology;;  . Lia Foyer INJECTION  09/16/2018   Procedure: SUBMUCOSAL INJECTION;  Surgeon: Yetta Flock, MD;  Location: [facility];  Service: Gastroenterology;;  . TONSILLECTOMY    . VASECTOMY     Family History  Problem Relation Age of Onset  . Diabetes Mother   . Alzheimer's disease Mother   . Prostate cancer Father 57       prostatectomy?  . Diabetes Maternal Uncle   . Stroke Paternal Uncle   . Diabetes Maternal Grandmother   . Heart disease Maternal Grandfather   .  Suicidality Brother   . Colon cancer Neg Hx   . Rectal cancer Neg Hx   . Esophageal cancer Neg Hx   . Stomach cancer Neg Hx    Social History   Socioeconomic History  . Marital status: Married    Spouse name: Patsy  . Number of children: 0  . Years of education: Not on file  . Highest education level: Not on file  Occupational History  . Occupation: retired, [employer]  Social Needs  . Financial resource strain: Not on file  . Food insecurity    Worry: Not on file    Inability: Not on file  . Transportation needs    Medical: Not on file    Non-medical: Not on file  Tobacco Use  . Smoking status: Former Smoker     Years: 5.00    Types: Cigarettes    Quit date: 01/29/1986    Years since quitting: 33.5  . Smokeless tobacco: Never Used  Substance and Sexual Activity  . Alcohol use: Yes    Alcohol/week: 7.0 standard drinks    Types: 7 Glasses of wine per week    Comment: 1 glass of wine with dinner  . Drug use: No  . Sexual activity: Not Currently  Lifestyle  . Physical activity    Days per week: Not on file    Minutes per session: Not on file  . Stress: Not on file  Relationships  . Social Herbalist on phone: Not on file    Gets together: Not on file    Attends religious service: Not on file    Active member of club or organization: Not on file    Attends meetings of clubs or organizations: Not on file    Relationship status: Not on file  Other Topics Concern  . Not on file  Social History Narrative   Household -- pt and wife    Outpatient Encounter Medications as of 08/07/2019  Medication Sig  . Ascorbic Acid (VITAMIN C) 1000 MG tablet Take 2,000 mg by mouth daily.   [name] atorvastatin (LIPITOR) 20 MG tablet Take 1 tablet (20 mg total) by mouth daily.  . Calcium Carbonate-Vit D-Min (CALCIUM 1200 PO) Take by mouth.  . Cholecalciferol (VITAMIN D3) 2000 UNITS TABS Take 2,000 Units by mouth daily.   . Cyanocobalamin (VITAMIN B-12) 5000 MCG TBDP Take 5,000 mcg by mouth daily.  [name] losartan (COZAAR) 50 MG tablet Take 1 tablet (50 mg total) by mouth daily.  . Multiple Vitamin (MULTIVITAMIN WITH MINERALS) TABS tablet Take 1 tablet by mouth daily. Centrum  . tamsulosin (FLOMAX) 0.4 MG CAPS capsule Take 0.4 mg by mouth daily after supper.  . vitamin E 400 UNIT capsule Take 800 Units by mouth daily.   No facility-administered encounter medications on file as of 08/07/2019.     Activities of Daily Living In your present state of health, do you have any difficulty performing the following activities: 08/07/2019  Hearing? Y  Comment wears hearing aids  Vision? N  Difficulty concentrating or  making decisions? N  Walking or climbing stairs? N  Dressing or bathing? N  Doing errands, shopping? N  Preparing Food and eating ? N  Using the Toilet? N  In the past six months, have you accidently leaked urine? N  Do you have problems with loss of bowel control? N  Managing your Medications? N  Managing your Finances? N  Housekeeping or managing your Housekeeping? N  Some recent  data might be hidden    Patient Care Team: Colon Branch, MD as PCP - General (Internal Medicine) Berle Mull, MD as Consulting Physician (Sports Medicine) Armbruster, Carlota Raspberry, MD as Consulting Physician (Gastroenterology) Festus Aloe, MD as Consulting Physician (Urology) Cira Rue, RN Nurse Navigator as Registered Nurse (Medical Oncology)   Assessment:   This is a routine wellness examination for [employer]. Physical assessment deferred to PCP.  Exercise Activities and Dietary recommendations Current Exercise Habits: The patient does not participate in regular exercise at present, Exercise limited by: None identified   Diet (meal preparation, eat out, water intake, caffeinated beverages, dairy products, fruits and vegetables): 24 hr recal Breakfast: cereal w/ peaches Lunch: sandwich Dinner: chipped beef and gravy over toast  Goals    . DIET - INCREASE WATER INTAKE    . Lose weight (pt-stated)     Weight today=214.8lb Goal <200lb with increasing exercise.        Fall Risk Fall Risk  08/07/2019 08/05/2018 08/03/2017 01/31/2017 01/31/2016  Falls in the past year? 0 No No No No    Depression Screen PHQ 2/9 Scores 08/07/2019 08/05/2018 08/03/2017 01/31/2017  PHQ - 2 Score 0 0 0 0    Cognitive Function   Ad8 score reviewed for issues:  Issues making decisions:no  Less interest in hobbies / activities:no  Repeats questions, stories (family complaining):no  Trouble using ordinary gadgets (microwave, computer, phone):no  Forgets the month or year: no  Mismanaging finances: no  Remembering  appts:no  Daily problems with thinking and/or memory:no Ad8 score is=0       Immunization History  Administered Date(s) Administered  . Influenza Split 09/22/2011, 08/25/2014  . Influenza Whole 08/23/2007, 07/09/2009, 07/25/2010  . Influenza, High Dose Seasonal PF 08/03/2017, 08/05/2018  . Influenza-Unspecified 08/17/2015  . Pneumococcal Conjugate-13 10/20/2014  . Pneumococcal Polysaccharide-23 07/03/2008, 08/03/2017  . Td 07/23/2009  . Zoster 03/25/2011   Screening Tests Health Maintenance  Topic Date Due  . INFLUENZA VACCINE  06/07/2019  . TETANUS/TDAP  07/24/2019  . COLONOSCOPY  04/20/2020  . Hepatitis C Screening  Completed  . PNA vac Low Risk Adult  Completed     Plan:   See you next year!  Continue to eat heart healthy diet (full of fruits, vegetables, whole grains, lean protein, water--limit salt, fat, and sugar intake) and increase physical activity as tolerated.  Continue doing brain stimulating activities (puzzles, reading, adult coloring books, staying active) to keep memory sharp.     I have personally reviewed and noted the following in the patient's chart:   . Medical and social history . Use of alcohol, tobacco or illicit drugs  . Current medications and supplements . Functional ability and status . Nutritional status . Physical activity . Advanced directives . List of other physicians . Hospitalizations, surgeries, and ER visits in previous 12 months . Vitals . Screenings to include cognitive, depression, and falls . Referrals and appointments  In addition, I have reviewed and discussed with patient certain preventive protocols, quality metrics, and best practice recommendations. A written personalized care plan for preventive services as well as general preventive health recommendations were provided to patient.     Shela Nevin, [place]  08/07/2019

## 2019-08-07 ENCOUNTER — Ambulatory Visit (INDEPENDENT_AMBULATORY_CARE_PROVIDER_SITE_OTHER): Payer: Medicare Other | Admitting: *Deleted

## 2019-08-07 ENCOUNTER — Other Ambulatory Visit: Payer: Self-pay

## 2019-08-07 ENCOUNTER — Encounter: Payer: Self-pay | Admitting: *Deleted

## 2019-08-07 VITALS — BP 133/59 | Wt 195.0 lb

## 2019-08-07 DIAGNOSIS — Z0000 Encounter for general adult medical examination without abnormal findings: Secondary | ICD-10-CM | POA: Diagnosis not present

## 2019-08-07 NOTE — Patient Instructions (Signed)
See you next year!  Continue to eat heart healthy diet (full of fruits, vegetables, whole grains, lean protein, water--limit salt, fat, and sugar intake) and increase physical activity as tolerated.  Continue doing brain stimulating activities (puzzles, reading, adult coloring books, staying active) to keep memory sharp.    Martin Chase , Thank you for taking time to come for your Medicare Wellness Visit. I appreciate your ongoing commitment to your health goals. Please review the following plan we discussed and let me know if I can assist you in the future.   These are the goals we discussed: Goals    . DIET - INCREASE WATER INTAKE    . Lose weight (pt-stated)     Weight today=214.8lb Goal <200lb with increasing exercise.        This is a list of the screening recommended for you and due dates:  Health Maintenance  Topic Date Due  . Flu Shot  06/07/2019  . Tetanus Vaccine  07/24/2019  . Colon Cancer Screening  04/20/2020  .  Hepatitis C: One time screening is recommended by Center for Disease Control  (CDC) for  adults born from [birth year] through 1967.   Completed  . Pneumonia vaccines  Completed    Health Maintenance After Age 65 After age 70, you are at a higher risk for certain long-term diseases and infections as well as injuries from falls. Falls are a major cause of broken bones and head injuries in people who are older than age 6. Getting regular preventive care can help to keep you healthy and well. Preventive care includes getting regular testing and making lifestyle changes as recommended by your health care provider. Talk with your health care provider about:  Which screenings and tests you should have. A screening is a test that checks for a disease when you have no symptoms.  A diet and exercise plan that is right for you. What should I know about screenings and tests to prevent falls? Screening and testing are the best ways to find a health problem early. Early diagnosis  and treatment give you the best chance of managing medical conditions that are common after age 23. Certain conditions and lifestyle choices may make you more likely to have a fall. Your health care provider may recommend:  Regular vision checks. Poor vision and conditions such as cataracts can make you more likely to have a fall. If you wear glasses, make sure to get your prescription updated if your vision changes.  Medicine review. Work with your health care provider to regularly review all of the medicines you are taking, including over-the-counter medicines. Ask your health care provider about any side effects that may make you more likely to have a fall. Tell your health care provider if any medicines that you take make you feel dizzy or sleepy.  Osteoporosis screening. Osteoporosis is a condition that causes the bones to get weaker. This can make the bones weak and cause them to break more easily.  Blood pressure screening. Blood pressure changes and medicines to control blood pressure can make you feel dizzy.  Strength and balance checks. Your health care provider may recommend certain tests to check your strength and balance while standing, walking, or changing positions.  Foot health exam. Foot pain and numbness, as well as not wearing proper footwear, can make you more likely to have a fall.  Depression screening. You may be more likely to have a fall if you have a fear of falling, feel emotionally  low, or feel unable to do activities that you used to do.  Alcohol use screening. Using too much alcohol can affect your balance and may make you more likely to have a fall. What actions can I take to lower my risk of falls? General instructions  Talk with your health care provider about your risks for falling. Tell your health care provider if: ? You fall. Be sure to tell your health care provider about all falls, even ones that seem minor. ? You feel dizzy, sleepy, or off-balance.   Take over-the-counter and prescription medicines only as told by your health care provider. These include any supplements.  Eat a healthy diet and maintain a healthy weight. A healthy diet includes low-fat dairy products, low-fat (lean) meats, and fiber from whole grains, beans, and lots of fruits and vegetables. Home safety  Remove any tripping hazards, such as rugs, cords, and clutter.  Install safety equipment such as grab bars in bathrooms and safety rails on stairs.  Keep rooms and walkways well-lit. Activity   Follow a regular exercise program to stay fit. This will help you maintain your balance. Ask your health care provider what types of exercise are appropriate for you.  If you need a cane or walker, use it as recommended by your health care provider.  Wear supportive shoes that have nonskid soles. Lifestyle  Do not drink alcohol if your health care provider tells you not to drink.  If you drink alcohol, limit how much you have: ? 0-1 drink a day for women. ? 0-2 drinks a day for men.  Be aware of how much alcohol is in your drink. In the [place] one drink equals one typical bottle of beer (12 oz), one-half glass of wine (5 oz), or one shot of hard liquor (1 oz).  Do not use any products that contain nicotine or tobacco, such as cigarettes and e-cigarettes. If you need help quitting, ask your health care provider. Summary  Having a healthy lifestyle and getting preventive care can help to protect your health and wellness after age 93.  Screening and testing are the best way to find a health problem early and help you avoid having a fall. Early diagnosis and treatment give you the best chance for managing medical conditions that are more common for people who are older than age 40.  Falls are a major cause of broken bones and head injuries in people who are older than age 48. Take precautions to prevent a fall at home.  Work with your health care provider to learn what  changes you can make to improve your health and wellness and to prevent falls. This information is not intended to replace advice given to you by your health care provider. Make sure you discuss any questions you have with your health care provider. Document Released: 09/05/2017 Document Revised: 02/13/2019 Document Reviewed: 09/05/2017 Elsevier Patient Education  2020 [employer].

## 2019-08-12 DIAGNOSIS — G4733 Obstructive sleep apnea (adult) (pediatric): Secondary | ICD-10-CM | POA: Diagnosis not present

## 2019-08-12 NOTE — Progress Notes (Signed)
Radiation Oncology         [phone] ________________________________  Name: VERNEL LEWAN MRN: [id number]  Date: 08/13/2019  DOB: [birth year]  Post Treatment Note  CC: Colon Branch, MD  Festus Aloe, MD  Diagnosis:   73 y.o. gentleman with stage T1c adenocarcinoma of the prostate with a Gleason's score of 4+4 and a PSA of 5.76     Interval Since Last Radiation:  5 weeks, concurrent with ADT 7/8-07/08/19: 1. The prostate, seminal vesicles, and pelvic lymph nodes were initially treated to 45 Gy in 25 fractions of 1.8 Gy  2. The prostate only was boosted to 75 Gy with 15 additional fractions of 2.0 Gy   Narrative:  I spoke with the patient to conduct his routine scheduled 1 month follow up visit via telephone to spare the patient unnecessary potential exposure in the healthcare setting during the current COVID-19 pandemic.  The patient was notified in advance and gave permission to proceed with this visit format. He tolerated radiation treatment relatively well.  He experienced some minor urinary irritation with nocturia x2, moderate flow and steady urine stream without complaints of dysuria, hematuria, or fatigue.  He did have the occasional sensation of incomplete bladder emptying but in general felt that he emptied his bladder well on voiding.  He denied any abdominal pain or bowel complaints.  He continued to tolerate ADT (started 01/24/19) well throughout his treatment.                            On review of systems, the patient states that he is doing very well overall.  He reports gradual improvement in his LUTS and specifically denies gross hematuria, dysuria, excessive daytime frequency, urgency, incomplete bladder emptying or incontinence.  He reports a healthy appetite and is maintaining his weight.  He denies abdominal pain, nausea, vomiting, diarrhea or constipation.  Overall, he is quite pleased with his progress to date.  ALLERGIES:  has No Known Allergies.  Meds: Current  Outpatient Medications  Medication Sig Dispense Refill  . Ascorbic Acid (VITAMIN C) 1000 MG tablet Take 2,000 mg by mouth daily.     [name] atorvastatin (LIPITOR) 20 MG tablet Take 1 tablet (20 mg total) by mouth daily. 90 tablet 3  . Calcium Carbonate-Vit D-Min (CALCIUM 1200 PO) Take by mouth.    . Cholecalciferol (VITAMIN D3) 2000 UNITS TABS Take 2,000 Units by mouth daily.     . Cyanocobalamin (VITAMIN B-12) 5000 MCG TBDP Take 5,000 mcg by mouth daily.    [name] losartan (COZAAR) 50 MG tablet Take 1 tablet (50 mg total) by mouth daily. 90 tablet 3  . Multiple Vitamin (MULTIVITAMIN WITH MINERALS) TABS tablet Take 1 tablet by mouth daily. Centrum    . tamsulosin (FLOMAX) 0.4 MG CAPS capsule Take 0.4 mg by mouth daily after supper.    . vitamin E 400 UNIT capsule Take 800 Units by mouth daily.     No current facility-administered medications for this visit.     Physical Findings:  vitals were not taken for this visit.   /Unable to assess due to telephone follow-up visit format.  Lab Findings: Lab Results  Component Value Date   WBC 9.0 05/01/2019   HGB 13.0 05/01/2019   HCT 38.6 (L) 05/01/2019   MCV 91.9 05/01/2019   PLT 227.0 05/01/2019     Radiographic Findings: No results found.  Impression/Plan: 1. 73 y.o. gentleman with stage T1c adenocarcinoma of  the prostate with a Gleason's score of 4+4 and a PSA of 5.76. He will continue to follow up with urology for ongoing PSA determinations and has an appointment scheduled with Dr. Junious Silk on 09/10/19.  He anticipates completing a full 2-year course of ADT under the care and direction of Dr. Junious Silk.  He understands what to expect with regards to PSA monitoring going forward. I will look forward to following his response to treatment via correspondence with urology, and would be happy to continue to participate in his care if clinically indicated. I talked to the patient about what to expect in the future, including his risk for erectile  dysfunction and rectal bleeding. I encouraged him to call or return to the office if he has any questions regarding his previous radiation or possible radiation side effects. He was comfortable with this plan and will follow up as needed.       Nicholos Johns, PA-C

## 2019-08-13 ENCOUNTER — Ambulatory Visit
Admission: RE | Admit: 2019-08-13 | Discharge: 2019-08-13 | Disposition: A | Discharge status: Home / Self Care | Payer: Medicare Other | Source: Ambulatory Visit | Attending: Urology | Admitting: Urology

## 2019-08-13 ENCOUNTER — Other Ambulatory Visit: Payer: Self-pay

## 2019-08-13 DIAGNOSIS — C61 Malignant neoplasm of prostate: Secondary | ICD-10-CM

## 2019-08-15 ENCOUNTER — Telehealth: Payer: Self-pay | Admitting: Pulmonary Disease

## 2019-08-15 NOTE — Telephone Encounter (Signed)
Called and spoke with Patient's Wide, Patsy (DPR). Sleep results have not been received from Dr.Olalere at this time.  Patsy aware that someone will contact them next week once results are available. Nothing further at this time.

## 2019-08-27 ENCOUNTER — Telehealth: Payer: Self-pay | Admitting: Pulmonary Disease

## 2019-08-27 NOTE — Telephone Encounter (Signed)
Pt calling requesting the results to his home sleep test performed 07/30/2019.   AO, please advise on the results of this HST, if available, and recommendations for this pt. Thank you.

## 2019-08-29 ENCOUNTER — Telehealth: Payer: Self-pay | Admitting: Pulmonary Disease

## 2019-08-29 NOTE — Telephone Encounter (Signed)
HST 08/15/2019 results per AO:  Pt's sleep study showed severe obstructive sleep apnea, with an average AHI of 32.3/hour and moderate oxygen desaturations.   Recommendations: -CPAP therapy for severe obstructive sleep apnea -Auto titrating CPAP with pressure settings of 5-20 will be appropriate -Close clinical follow up with compliance monitoring to optimize therapeutic efficiency  -Weight loss measures encouraged -Caution against driving with sleepy & against medications with sedative side effects.   Called and spoke w/ pt to make him aware of the above results and recommendations. Pt verbalized understanding with no additional questions. Order for new CPAP machine auto 5-20 has been placed to pt's preferred DME, [facility]. 31-90 f/u appt per insurance requirements has been scheduled. Nothing further needed at this time.

## 2019-08-29 NOTE — Telephone Encounter (Signed)
Called and spoke w/ pt. Pt states his current CPAP machine is over 75 years old and is outdated to the point where parts cannot be ordered anymore. Pt inquired if AO would approve of pt getting a new CPAP machine.   Looking at previous telephone notes, pt had not had his HST resulted from 08/15/2019. I printed these results and resulted AO's impression to pt, which pt verbalized understanding to. I also let him know we would be sending an order for a new CPAP machine auto 5-20 to [facility] (pt states he has done business with [facility] in the past and prefers them). Order for CPAP auto 5-20 has been placed to [facility]. 31-90 f/u per insurance requirements has been scheduled for 11/04/2019 at 2:00 PM EDT. I have also updated the previous telephone note with pt's results and recommendations. Nothing further needed at this time.

## 2019-09-10 DIAGNOSIS — R3914 Feeling of incomplete bladder emptying: Secondary | ICD-10-CM | POA: Diagnosis not present

## 2019-09-11 ENCOUNTER — Telehealth: Payer: Self-pay | Admitting: Pulmonary Disease

## 2019-09-11 DIAGNOSIS — G4733 Obstructive sleep apnea (adult) (pediatric): Secondary | ICD-10-CM

## 2019-09-11 NOTE — Telephone Encounter (Signed)
Spoke with pt. He is inquiring about his CPAP order that was sent to [facility]. Pt was given his sleep study results on 08/29/2019, he was told that an order would be placed for his CPAP. This order was never placed. I apologized to the pt for this mistake. He was very receptive and accepted the apology. Order has been placed. Advised the pt to contact us back if he does not hear from [facility] in a timely manner. Nothing further was needed.

## 2019-09-12 LAB — PSA: PSA: 0.108

## 2019-09-15 ENCOUNTER — Telehealth: Payer: Self-pay | Admitting: Pulmonary Disease

## 2019-09-15 NOTE — Telephone Encounter (Signed)
Call returned to patient, made aware [facility] now [facility] the order. Voiced understanding. Nothing further needed at this time.

## 2019-09-15 NOTE — Telephone Encounter (Signed)
Call made to [facility], spoke with Juliann Pulse, they still do not have the order. Order re-faxed. Will call at the end of the day to be sure order is received. Will leave in triage.

## 2019-09-24 DIAGNOSIS — G4733 Obstructive sleep apnea (adult) (pediatric): Secondary | ICD-10-CM | POA: Diagnosis not present

## 2019-10-10 DIAGNOSIS — G4733 Obstructive sleep apnea (adult) (pediatric): Secondary | ICD-10-CM | POA: Diagnosis not present

## 2019-10-24 DIAGNOSIS — G4733 Obstructive sleep apnea (adult) (pediatric): Secondary | ICD-10-CM | POA: Diagnosis not present

## 2019-10-27 ENCOUNTER — Other Ambulatory Visit: Payer: Self-pay | Admitting: Internal Medicine

## 2019-10-28 ENCOUNTER — Other Ambulatory Visit: Payer: Self-pay | Admitting: Internal Medicine

## 2019-10-30 ENCOUNTER — Other Ambulatory Visit: Payer: Self-pay

## 2019-11-03 ENCOUNTER — Other Ambulatory Visit: Payer: Self-pay

## 2019-11-03 ENCOUNTER — Ambulatory Visit (INDEPENDENT_AMBULATORY_CARE_PROVIDER_SITE_OTHER): Payer: Medicare Other | Admitting: Internal Medicine

## 2019-11-03 ENCOUNTER — Encounter: Payer: Self-pay | Admitting: Internal Medicine

## 2019-11-03 VITALS — BP 149/73 | HR 67 | Temp 97.2°F | Resp 18 | Wt 215.0 lb

## 2019-11-03 DIAGNOSIS — Z23 Encounter for immunization: Secondary | ICD-10-CM

## 2019-11-03 DIAGNOSIS — G3184 Mild cognitive impairment, so stated: Secondary | ICD-10-CM | POA: Diagnosis not present

## 2019-11-03 DIAGNOSIS — R739 Hyperglycemia, unspecified: Secondary | ICD-10-CM | POA: Diagnosis not present

## 2019-11-03 DIAGNOSIS — F0390 Unspecified dementia without behavioral disturbance: Secondary | ICD-10-CM | POA: Diagnosis not present

## 2019-11-03 DIAGNOSIS — E785 Hyperlipidemia, unspecified: Secondary | ICD-10-CM | POA: Diagnosis not present

## 2019-11-03 DIAGNOSIS — Z0000 Encounter for general adult medical examination without abnormal findings: Secondary | ICD-10-CM | POA: Diagnosis not present

## 2019-11-03 LAB — COMPREHENSIVE METABOLIC PANEL WITH GFR
ALT: 21 U/L (ref 0–53)
AST: 29 U/L (ref 0–37)
Albumin: 4.2 g/dL (ref 3.5–5.2)
Alkaline Phosphatase: 71 U/L (ref 39–117)
BUN: 17 mg/dL (ref 6–23)
CO2: 31 meq/L (ref 19–32)
Calcium: 9.3 mg/dL (ref 8.4–10.5)
Chloride: 103 meq/L (ref 96–112)
Creatinine, Ser: 0.83 mg/dL (ref 0.40–1.50)
GFR: 90.29 mL/min
Glucose, Bld: 111 mg/dL — ABNORMAL HIGH (ref 70–99)
Potassium: 4.8 meq/L (ref 3.5–5.1)
Sodium: 139 meq/L (ref 135–145)
Total Bilirubin: 0.6 mg/dL (ref 0.2–1.2)
Total Protein: 6.9 g/dL (ref 6.0–8.3)

## 2019-11-03 LAB — CBC
HCT: 39 % (ref 39.0–52.0)
Hemoglobin: 12.9 g/dL — ABNORMAL LOW (ref 13.0–17.0)
MCHC: 33.1 g/dL (ref 30.0–36.0)
MCV: 93.9 fl (ref 78.0–100.0)
Platelets: 241 10*3/uL (ref 150.0–400.0)
RBC: 4.16 Mil/uL — ABNORMAL LOW (ref 4.22–5.81)
RDW: 13.8 % (ref 11.5–15.5)
WBC: 6.6 10*3/uL (ref 4.0–10.5)

## 2019-11-03 LAB — FOLATE: Folate: >24.1 ng/mL (ref >5.9)

## 2019-11-03 LAB — TSH: TSH: 3.52 u[IU]/mL (ref 0.35–4.50)

## 2019-11-03 LAB — VITAMIN B12: Vitamin B-12: 1283 pg/mL — ABNORMAL HIGH (ref 211–911)

## 2019-11-03 LAB — HEMOGLOBIN A1C: Hgb A1c MFr Bld: 6.2 % (ref 4.6–6.5)

## 2019-11-03 MED ORDER — SHINGRIX 50 MCG/0.5ML IM SUSR: 0.5000 mL | Freq: Once | INTRAMUSCULAR | 1 refills | Status: AC

## 2019-11-03 MED ORDER — TETANUS-DIPHTH-ACELL PERTUSSIS 5-2-15.5 LF-MCG/0.5 IM SUSP: 0.5000 mL | Freq: Once | INTRAMUSCULAR | 0 refills | Status: AC

## 2019-11-03 NOTE — Patient Instructions (Addendum)
GO TO THE LAB : Get the blood work     GO TO THE FRONT DESK Schedule your next appointment   for checkup in 1 month    Check the  blood pressure twice a week BP GOAL is between 110/65 and  135/85. If it is consistently higher or lower, let me know

## 2019-11-03 NOTE — Progress Notes (Signed)
Subjective:    Patient ID: Martin Chase, male    DOB: August 16, 1946, 73 y.o.   MRN: [id number]  DOS:  11/03/2019 Type of visit - description: CPX   Here with his wife Prostate cancer: He finished radiation therapy and now is on Lupron. For the last 3 to 4 days the patient has noticed pain at the anterior right thigh.  No redness, swelling.  No back or hip pain per se Pain is somewhat worse when he moves.  Also, wife is concerned about his mental status, she thinks he is developing dementia. A couple of times has woke up inappropriately early,   woke her up and start to talk about things. "He has no concept of time, things to do or days". Symptoms started approximately 6 months ago and it coincides with initiation of Lupron. He is less interactive as well.   Review of Systems  Other than above, a 14 point review of systems is negative     Past Medical History:  Diagnosis Date  . Allergy   . Anemia 1996   due to a bleeding from aspirin  . Arthritis   . Cataract   . History of blood transfusion 1996  . HOH (hard of hearing)    uses hearing aids/ hearing on left side worse  . Hyperlipidemia   . Hypertension   . Prediabetes 10/20/2014  . Prostate cancer ([place])   . Sleep apnea    uses c-pap    Past Surgical History:  Procedure Laterality Date  . COLONOSCOPY WITH PROPOFOL N/A 09/16/2018   Procedure: COLONOSCOPY WITH PROPOFOL;  Surgeon: Yetta Flock, MD;  Location: [facility];  Service: Gastroenterology;  Laterality: N/A;  . POLYPECTOMY  09/16/2018   Procedure: POLYPECTOMY;  Surgeon: Yetta Flock, MD;  Location: [facility];  Service: Gastroenterology;;  . Lia Foyer INJECTION  09/16/2018   Procedure: SUBMUCOSAL INJECTION;  Surgeon: Yetta Flock, MD;  Location: [facility];  Service: Gastroenterology;;  . TONSILLECTOMY    . VASECTOMY      Social History   Socioeconomic History  . Marital status: Married    Spouse name: Patsy  . Number of  children: 0  . Years of education: Not on file  . Highest education level: Not on file  Occupational History  . Occupation: retired, [employer]  Tobacco Use  . Smoking status: Former Smoker    Years: 5.00    Types: Cigarettes    Quit date: 01/29/1986    Years since quitting: 33.7  . Smokeless tobacco: Never Used  Substance and Sexual Activity  . Alcohol use: Yes    Alcohol/week: 7.0 standard drinks    Types: 7 Glasses of wine per week    Comment: 1 glass of wine with dinner  . Drug use: No  . Sexual activity: Not Currently  Other Topics Concern  . Not on file  Social History Narrative   Household -- pt and wife   Social Determinants of Health   Financial Resource Strain:   . Difficulty of Paying Living Expenses: Not on file  Food Insecurity:   . Worried About Charity fundraiser in the Last Year: Not on file  . Ran Out of Food in the Last Year: Not on file  Transportation Needs:   . Lack of Transportation (Medical): Not on file  . Lack of Transportation (Non-Medical): Not on file  Physical Activity:   . Days of Exercise per Week: Not on file  . Minutes of  Exercise per Session: Not on file  Stress:   . Feeling of Stress : Not on file  Social Connections:   . Frequency of Communication with Friends and Family: Not on file  . Frequency of Social Gatherings with Friends and Family: Not on file  . Attends Religious Services: Not on file  . Active Member of Clubs or Organizations: Not on file  . Attends Archivist Meetings: Not on file  . Marital Status: Not on file  Intimate Partner Violence:   . Fear of Current or Ex-Partner: Not on file  . Emotionally Abused: Not on file  . Physically Abused: Not on file  . Sexually Abused: Not on file     Family History  Problem Relation Age of Onset  . Diabetes Mother   . Alzheimer's disease Mother   . Prostate cancer Father 36       prostatectomy?  . Diabetes Maternal Uncle   . Stroke Paternal Uncle     . Diabetes Maternal Grandmother   . Heart disease Maternal Grandfather   . Suicidality Brother   . Colon cancer Neg Hx   . Rectal cancer Neg Hx   . Esophageal cancer Neg Hx   . Stomach cancer Neg Hx      Allergies as of 11/03/2019   No Known Allergies     Medication List       Accurate as of November 03, 2019 11:59 PM. If you have any questions, ask your nurse or doctor.        atorvastatin 20 MG tablet Commonly known as: LIPITOR Take 1 tablet (20 mg total) by mouth daily.   CALCIUM 1200 PO Take by mouth.   losartan 50 MG tablet Commonly known as: COZAAR Take 1 tablet (50 mg total) by mouth daily.   multivitamin with minerals Tabs tablet Take 1 tablet by mouth daily. Centrum   Shingrix injection Generic drug: Zoster Vaccine Adjuvanted Inject 0.5 mLs into the muscle once for 1 dose. Started by: [name], MD   tamsulosin 0.4 MG Caps capsule Commonly known as: FLOMAX Take 0.4 mg by mouth daily after supper.   Tdap 03-07-14.5 LF-MCG/0.5 injection Commonly known as: ADACEL Inject 0.5 mLs into the muscle once for 1 dose. Started by: [name], MD   Vitamin B-12 5000 MCG Tbdp Take 5,000 mcg by mouth daily.   vitamin C 1000 MG tablet Take 2,000 mg by mouth daily.   Vitamin D3 50 MCG (2000 UT) Tabs Take 2,000 Units by mouth daily.   vitamin E 400 UNIT capsule Take 800 Units by mouth daily.           Objective:   Physical Exam BP (!) 149/73 (BP Location: Right Arm, Patient Position: Sitting, Cuff Size: Normal)   Pulse 67   Temp (!) 97.2 F (36.2 C) (Temporal)   Resp 18   Wt 215 lb (97.5 kg)   SpO2 97%   BMI 29.99 kg/m  General: Well developed, NAD, BMI noted Neck: No  thyromegaly  HEENT:  Normocephalic . Face symmetric, atraumatic Lungs:  CTA B Normal respiratory effort, no intercostal retractions, no accessory muscle use. Heart: RRR,  no murmur.  No pretibial edema bilaterally  Abdomen:  Not distended, soft, non-tender. No rebound or  rigidity.   Skin: Exposed areas without rash. Not pale. Not jaundice Neurologic:  alert & oriented to self, did not know that we just had Christmas, he thought this month is  July.  Did not recall what he  ate yesterday, did not recall his cell phone. Speech normal, gait appropriate for age and unassisted Strength symmetric and appropriate for age.  Psych: Behavior appropriate. No anxious or depressed appearing.     Assessment      Assessment Prediabetes HTN (dx @ [facility] ~ 01/2018) Hyperlipidemia DJD  Allergies PUD per pt, dx early 2000s, related to ASA, was told not to take Prostate cancer: DX 12-2018, UroLift, to start external radiation 04/2019  PLAN Here for CPX Prediabetes: Diet controlled, check A1c HTN: BP today 149/73, slightly elevated.  For now continue losartan. Hyperlipidemia: On Lipitor, checking labs Prostate cancer: Finished XRT 07-2019, on Lupron, last shot 09-2019. Dementia : Patient definitely have an issue with memory, started 6 months ago, family thinks may be related to Lupron. Plan: Brain MRI, [id number], folic acid, RPR.  Return to the office in 1 month for a formal mental exam. RTC 1 month  Today, in addition to the physical exam, I spent more than 15 min with the patient: >50% of the time counseling regards new problem: Dementia.  Also assessing his chronic medical problems.    This visit occurred during the SARS-CoV-2 public health emergency.  Safety protocols were in place, including screening questions prior to the visit, additional usage of staff PPE, and extensive cleaning of exam room while observing appropriate contact time as indicated for disinfecting solutions.

## 2019-11-04 ENCOUNTER — Encounter: Payer: Self-pay | Admitting: Pulmonary Disease

## 2019-11-04 ENCOUNTER — Ambulatory Visit (INDEPENDENT_AMBULATORY_CARE_PROVIDER_SITE_OTHER): Payer: Medicare Other | Admitting: Pulmonary Disease

## 2019-11-04 DIAGNOSIS — F0150 Vascular dementia without behavioral disturbance: Secondary | ICD-10-CM | POA: Insufficient documentation

## 2019-11-04 DIAGNOSIS — F03A0 Unspecified dementia, mild, without behavioral disturbance, psychotic disturbance, mood disturbance, and anxiety: Secondary | ICD-10-CM | POA: Insufficient documentation

## 2019-11-04 DIAGNOSIS — G4733 Obstructive sleep apnea (adult) (pediatric): Secondary | ICD-10-CM

## 2019-11-04 DIAGNOSIS — Z9989 Dependence on other enabling machines and devices: Secondary | ICD-10-CM | POA: Diagnosis not present

## 2019-11-04 DIAGNOSIS — F0390 Unspecified dementia without behavioral disturbance: Secondary | ICD-10-CM | POA: Insufficient documentation

## 2019-11-04 LAB — LIPID PANEL WITH LDL/HDL RATIO
Cholesterol, Total: 164 mg/dL (ref 100–199)
HDL: 64 mg/dL (ref >39)
LDL Chol Calc (NIH): 79 mg/dL (ref 0–99)
LDL/HDL Ratio: 1.2 ratio (ref 0.0–3.6)
Triglycerides: 118 mg/dL (ref 0–149)
VLDL Cholesterol Cal: 21 mg/dL (ref 5–40)

## 2019-11-04 LAB — RPR: RPR Ser Ql: NONREACTIVE

## 2019-11-04 NOTE — Patient Instructions (Signed)
Severe obstructive sleep apnea adequately treated with CPAP therapy  Continue CPAP use  Follow-up in 6 months  Call with any significant concerns

## 2019-11-04 NOTE — Assessment & Plan Note (Signed)
-  Td 2010, prescription for Tdap printed -pnm 23: 2018 - Zostavax 2012 - Prevnar 2015 -Prescription for Shingrix printed --prostate cancer screening:  Has prostate cancer. --CCS: Colonoscopy 2009, Cscope 04/2019  --Diet and exercise discussed --[id number], CBC, CMP, folic acid, [id number], high cholesterol, RPR, TSH

## 2019-11-04 NOTE — Progress Notes (Signed)
Virtual Visit via Telephone Note  I connected with Martin Chase on 11/04/19 at  2:00 PM EST by telephone and verified that I am speaking with the correct person using two identifiers.  Location: Patient: Martin Chase Provider: Adrian Prince discussed the limitations, risks, security and privacy concerns of performing an evaluation and management service by telephone and the availability of in person appointments. I also discussed with the patient that there may be a patient responsible charge related to this service. The patient expressed understanding and agreed to proceed.   History of Present Illness: Patient switched over to a new CPAP Tolerating CPAP very well  Daytime symptoms are well controlled Sleep quality perceived to be better as well    Observations/Objective: Sounds well on the phone  Assessment and Plan: Severe obstructive sleep apnea adequately treated with CPAP therapy  Compliance data reviewed and discussed with the patient, reveals 100% compliance, settings of 5-15, 95 percentile pressure of 11.3 Residual AHI of 8.3-this number may be related to leaks  Follow Up Instructions: Encouraged to continue using CPAP on a regular basis  Encouraged to call if any significant concerns  Follow-up in 6 months   I discussed the assessment and treatment plan with the patient. The patient was provided an opportunity to ask questions and all were answered. The patient agreed with the plan and demonstrated an understanding of the instructions.   The patient was advised to call back or seek an in-person evaluation if the symptoms worsen or if the condition fails to improve as anticipated.  I provided 12 minutes of non-face-to-face time during this encounter.   Laurin Coder, MD

## 2019-11-04 NOTE — Assessment & Plan Note (Addendum)
Here for CPX Prediabetes: Diet controlled, check A1c HTN: BP today 149/73, slightly elevated.  For now continue losartan. Hyperlipidemia: On Lipitor, checking labs Prostate cancer: Finished XRT 07-2019, on Lupron, last shot 09-2019. Dementia : Patient definitely have an issue with memory, started 6 months ago, family thinks may be related to Lupron. Plan: Brain MRI, 123456, folic acid, RPR.  Return to the office in 1 month for a formal mental exam. RTC 1 month

## 2019-11-08 ENCOUNTER — Ambulatory Visit (HOSPITAL_BASED_OUTPATIENT_CLINIC_OR_DEPARTMENT_OTHER)
Admission: RE | Admit: 2019-11-08 | Discharge: 2019-11-08 | Disposition: A | Discharge status: Home / Self Care | Payer: Medicare Other | Source: Ambulatory Visit | Attending: Internal Medicine | Admitting: Internal Medicine

## 2019-11-08 ENCOUNTER — Other Ambulatory Visit: Payer: Self-pay

## 2019-11-08 DIAGNOSIS — G3184 Mild cognitive impairment, so stated: Secondary | ICD-10-CM | POA: Insufficient documentation

## 2019-11-08 DIAGNOSIS — R413 Other amnesia: Secondary | ICD-10-CM | POA: Diagnosis not present

## 2019-11-12 DIAGNOSIS — H52203 Unspecified astigmatism, bilateral: Secondary | ICD-10-CM | POA: Diagnosis not present

## 2019-11-12 DIAGNOSIS — H2513 Age-related nuclear cataract, bilateral: Secondary | ICD-10-CM | POA: Diagnosis not present

## 2019-11-24 DIAGNOSIS — G4733 Obstructive sleep apnea (adult) (pediatric): Secondary | ICD-10-CM | POA: Diagnosis not present

## 2019-11-27 DIAGNOSIS — M25551 Pain in right hip: Secondary | ICD-10-CM | POA: Diagnosis not present

## 2019-11-27 DIAGNOSIS — M1711 Unilateral primary osteoarthritis, right knee: Secondary | ICD-10-CM | POA: Diagnosis not present

## 2019-11-27 DIAGNOSIS — M545 Low back pain: Secondary | ICD-10-CM | POA: Diagnosis not present

## 2019-12-01 ENCOUNTER — Ambulatory Visit (INDEPENDENT_AMBULATORY_CARE_PROVIDER_SITE_OTHER): Payer: Medicare Other | Admitting: Internal Medicine

## 2019-12-01 ENCOUNTER — Encounter: Payer: Self-pay | Admitting: Internal Medicine

## 2019-12-01 ENCOUNTER — Other Ambulatory Visit: Payer: Self-pay

## 2019-12-01 VITALS — BP 155/53 | HR 60 | Temp 97.7°F | Resp 16 | Ht 71.0 in | Wt 217.1 lb

## 2019-12-01 DIAGNOSIS — F0390 Unspecified dementia without behavioral disturbance: Secondary | ICD-10-CM | POA: Diagnosis not present

## 2019-12-01 DIAGNOSIS — F03A0 Unspecified dementia, mild, without behavioral disturbance, psychotic disturbance, mood disturbance, and anxiety: Secondary | ICD-10-CM

## 2019-12-01 MED ORDER — DONEPEZIL HCL 5 MG PO TABS: 5.0000 mg | ORAL_TABLET | Freq: Every day | ORAL | 0 refills | Status: DC

## 2019-12-01 NOTE — Patient Instructions (Signed)
GO TO THE FRONT DESK Schedule your next appointment   for a checkup in 4 months  I sent a prescription for Aricept  , let me know in 6 weeks how is going.   If no side effects we could increase your dose    Donepezil tablets What is this medicine? DONEPEZIL (doe NEP e zil) is used to treat mild to moderate dementia caused by Alzheimer's disease. This medicine may be used for other purposes; ask your health care provider or pharmacist if you have questions. COMMON BRAND NAME(S): Aricept What should I tell my health care provider before I take this medicine? They need to know if you have any of these conditions:  asthma or other lung disease  difficulty passing urine  head injury  heart disease  history of irregular heartbeat  liver disease  seizures (convulsions)  stomach or intestinal disease, ulcers or stomach bleeding  an unusual or allergic reaction to donepezil, other medicines, foods, dyes, or preservatives  pregnant or trying to get pregnant  breast-feeding How should I use this medicine? Take this medicine by mouth with a glass of water. Follow the directions on the prescription label. You may take this medicine with or without food. Take this medicine at regular intervals. This medicine is usually taken before bedtime. Do not take it more often than directed. Continue to take your medicine even if you feel better. Do not stop taking except on your doctor's advice. If you are taking the 23 mg donepezil tablet, swallow it whole; do not cut, crush, or chew it. Talk to your pediatrician regarding the use of this medicine in children. Special care may be needed. Overdosage: If you think you have taken too much of this medicine contact a poison control center or emergency room at once. NOTE: This medicine is only for you. Do not share this medicine with others. What if I miss a dose? If you miss a dose, take it as soon as you can. If it is almost time for your next  dose, take only that dose, do not take double or extra doses. What may interact with this medicine? Do not take this medicine with any of the following medications:  certain medicines for fungal infections like itraconazole, fluconazole, posaconazole, and voriconazole  cisapride  dextromethorphan; quinidine  dronedarone  pimozide  quinidine  thioridazine This medicine may also interact with the following medications:  antihistamines for allergy, cough and cold  atropine  bethanechol  carbamazepine  certain medicines for bladder problems like oxybutynin, tolterodine  certain medicines for Parkinson's disease like benztropine, trihexyphenidyl  certain medicines for stomach problems like dicyclomine, hyoscyamine  certain medicines for travel sickness like scopolamine  dexamethasone  dofetilide  ipratropium  NSAIDs, medicines for pain and inflammation, like ibuprofen or naproxen  other medicines for Alzheimer's disease  other medicines that prolong the QT interval (cause an abnormal heart rhythm)  phenobarbital  phenytoin  rifampin, rifabutin or rifapentine  ziprasidone This list may not describe all possible interactions. Give your health care provider a list of all the medicines, herbs, non-prescription drugs, or dietary supplements you use. Also tell them if you smoke, drink alcohol, or use illegal drugs. Some items may interact with your medicine. What should I watch for while using this medicine? Visit your doctor or health care professional for regular checks on your progress. Check with your doctor or health care professional if your symptoms do not get better or if they get worse. You may get drowsy or  dizzy. Do not drive, use machinery, or do anything that needs mental alertness until you know how this drug affects you. What side effects may I notice from receiving this medicine? Side effects that you should report to your doctor or health care  professional as soon as possible:  allergic reactions like skin rash, itching or hives, swelling of the face, lips, or tongue  feeling faint or lightheaded, falls  loss of bladder control  seizures  signs and symptoms of a dangerous change in heartbeat or heart rhythm like chest pain; dizziness; fast or irregular heartbeat; palpitations; feeling faint or lightheaded, falls; breathing problems  signs and symptoms of infection like fever or chills; cough; sore throat; pain or trouble passing urine  signs and symptoms of liver injury like dark yellow or brown urine; general ill feeling or flu-like symptoms; light-colored stools; loss of appetite; nausea; right upper belly pain; unusually weak or tired; yellowing of the eyes or skin  slow heartbeat or palpitations  unusual bleeding or bruising  vomiting Side effects that usually do not require medical attention (report to your doctor or health care professional if they continue or are bothersome):  diarrhea, especially when starting treatment  headache  loss of appetite  muscle cramps  nausea  stomach upset This list may not describe all possible side effects. Call your doctor for medical advice about side effects. You may report side effects to FDA at 1-800-FDA-1088. Where should I keep my medicine? Keep out of reach of children. Store at room temperature between 15 and 30 degrees C (59 and 86 degrees F). Throw away any unused medicine after the expiration date. NOTE: This sheet is a summary. It may not cover all possible information. If you have questions about this medicine, talk to your doctor, pharmacist, or health care provider.  2020 Elsevier/Gold Standard (2018-10-14 10:33:41)

## 2019-12-01 NOTE — Progress Notes (Signed)
   Subjective:    Patient ID: Martin Chase, male    DOB: 1945-12-28, 74 y.o.   MRN: [id number]  DOS:  12/01/2019 Type of visit - description: Follow-up, here with his wife Here to assess memory issues. Decreased memory gradually for long time as reported with his wife. The patient actually agrees with her.     Review of Systems No recent problems, no recent head injury.  Past Medical History:  Diagnosis Date  . Allergy   . Anemia 1996   due to a bleeding from aspirin  . Arthritis   . Cataract   . History of blood transfusion 1996  . HOH (hard of hearing)    uses hearing aids/ hearing on left side worse  . Hyperlipidemia   . Hypertension   . Prediabetes 10/20/2014  . Prostate cancer ([place])   . Sleep apnea    uses c-pap    Past Surgical History:  Procedure Laterality Date  . COLONOSCOPY WITH PROPOFOL N/A 09/16/2018   Procedure: COLONOSCOPY WITH PROPOFOL;  Surgeon: Yetta Flock, MD;  Location: [facility];  Service: Gastroenterology;  Laterality: N/A;  . POLYPECTOMY  09/16/2018   Procedure: POLYPECTOMY;  Surgeon: Yetta Flock, MD;  Location: [facility];  Service: Gastroenterology;;  . Lia Foyer INJECTION  09/16/2018   Procedure: SUBMUCOSAL INJECTION;  Surgeon: Yetta Flock, MD;  Location: [facility];  Service: Gastroenterology;;  . TONSILLECTOMY    . VASECTOMY          Objective:   Physical Exam BP (!) 155/53 (BP Location: Left Arm, Patient Position: Sitting, Cuff Size: Normal)   Pulse 60   Temp 97.7 F (36.5 C) (Temporal)   Resp 16   Ht 5\' 11"  (1.803 m)   Wt 217 lb 2 oz (98.5 kg)   SpO2 100%   BMI 30.28 kg/m      General:   Well developed, NAD, BMI noted. HEENT:  Normocephalic . Face symmetric, atraumatic Neurologic:  alert & oriented to selfand time. MMSE: Score 24, mild dementia Speech normal, gait appropriate for age and unassisted Psych--  Cognition and judgment appear intact.  Cooperative with normal attention span  and concentration.  Behavior appropriate. No anxious or depressed appearing.   Assessment      Assessment Prediabetes HTN ([facility] ~ 01/2018) Hyperlipidemia DJD  Allergies PUD per pt, dx early 2000s, related to ASA, was told not to take Prostate cancer: DX 12-2018, UroLift, to start external radiation 04/2019  PLAN Dementia: Here with his wife, sxs started gradually a while back, since the last visit, blood work and MRI for evaluation of dementia were essentially unremarkable or age-appropriate. The patient still drives limited, does not pay the bills, no behavioral issues, no wandering. MMSE score 24, mild dementia. Treatment options: Stay physically and mentally active, good nutrition. We also talk about medication. They agreed to start Aricept, low-dose and let me know in 6 weeks how he is doing, if no side effects, increase the dose. Long discussion about what to expect from this medication. HTN: Slightly elevated BP today however at home is consistently in the 120s, Q000111Q with diastolic BP in the 0000000 RTC 4 months  Face-to-face time 35 minutes  This visit occurred during the SARS-CoV-2 public health emergency.  Safety protocols were in place, including screening questions prior to the visit, additional usage of staff PPE, and extensive cleaning of exam room while observing appropriate contact time as indicated for disinfecting solutions.

## 2019-12-01 NOTE — Progress Notes (Signed)
Pre visit review using our clinic review tool, if applicable. No additional management support is needed unless otherwise documented below in the visit note. 

## 2019-12-02 NOTE — Assessment & Plan Note (Signed)
Dementia: Here with his wife, sxs started gradually a while back, since the last visit, blood work and MRI for evaluation of dementia were essentially unremarkable or age-appropriate. The patient still drives limited, does not pay the bills, no behavioral issues, no wandering. MMSE score 24, mild dementia. Treatment options: Stay physically and mentally active, good nutrition. We also talk about medication. They agreed to start Aricept, low-dose and let me know in 6 weeks how he is doing, if no side effects, increase the dose. Long discussion about what to expect from this medication. HTN: Slightly elevated BP today however at home is consistently in the 120s, Q000111Q with diastolic BP in the 0000000 RTC 4 months

## 2019-12-07 ENCOUNTER — Encounter: Payer: Self-pay | Admitting: Internal Medicine

## 2019-12-25 DIAGNOSIS — G4733 Obstructive sleep apnea (adult) (pediatric): Secondary | ICD-10-CM | POA: Diagnosis not present

## 2020-01-12 ENCOUNTER — Encounter: Payer: Self-pay | Admitting: Internal Medicine

## 2020-01-12 MED ORDER — DONEPEZIL HCL 10 MG PO TABS: 10.0000 mg | ORAL_TABLET | Freq: Every day | ORAL | 1 refills | Status: DC

## 2020-01-16 ENCOUNTER — Other Ambulatory Visit: Payer: Self-pay | Admitting: Internal Medicine

## 2020-01-22 DIAGNOSIS — G4733 Obstructive sleep apnea (adult) (pediatric): Secondary | ICD-10-CM | POA: Diagnosis not present

## 2020-02-13 DIAGNOSIS — G4733 Obstructive sleep apnea (adult) (pediatric): Secondary | ICD-10-CM | POA: Diagnosis not present

## 2020-02-22 DIAGNOSIS — G4733 Obstructive sleep apnea (adult) (pediatric): Secondary | ICD-10-CM | POA: Diagnosis not present

## 2020-03-10 LAB — PSA: PSA: 0.02

## 2020-03-17 DIAGNOSIS — R3914 Feeling of incomplete bladder emptying: Secondary | ICD-10-CM | POA: Diagnosis not present

## 2020-03-23 DIAGNOSIS — G4733 Obstructive sleep apnea (adult) (pediatric): Secondary | ICD-10-CM | POA: Diagnosis not present

## 2020-04-01 ENCOUNTER — Other Ambulatory Visit: Payer: Self-pay

## 2020-04-01 ENCOUNTER — Ambulatory Visit (INDEPENDENT_AMBULATORY_CARE_PROVIDER_SITE_OTHER): Payer: Medicare Other | Admitting: Internal Medicine

## 2020-04-01 ENCOUNTER — Encounter: Payer: Self-pay | Admitting: Internal Medicine

## 2020-04-01 ENCOUNTER — Ambulatory Visit (HOSPITAL_BASED_OUTPATIENT_CLINIC_OR_DEPARTMENT_OTHER)
Admission: RE | Admit: 2020-04-01 | Discharge: 2020-04-01 | Disposition: A | Discharge status: Home / Self Care | Payer: Medicare Other | Source: Ambulatory Visit | Attending: Internal Medicine | Admitting: Internal Medicine

## 2020-04-01 VITALS — BP 142/44 | HR 59 | Temp 96.7°F | Resp 18 | Ht 71.0 in | Wt 213.4 lb

## 2020-04-01 DIAGNOSIS — F03A0 Unspecified dementia, mild, without behavioral disturbance, psychotic disturbance, mood disturbance, and anxiety: Secondary | ICD-10-CM

## 2020-04-01 DIAGNOSIS — G8929 Other chronic pain: Secondary | ICD-10-CM | POA: Diagnosis not present

## 2020-04-01 DIAGNOSIS — I10 Essential (primary) hypertension: Secondary | ICD-10-CM | POA: Diagnosis not present

## 2020-04-01 DIAGNOSIS — M545 Low back pain, unspecified: Secondary | ICD-10-CM

## 2020-04-01 DIAGNOSIS — F0390 Unspecified dementia without behavioral disturbance: Secondary | ICD-10-CM | POA: Diagnosis not present

## 2020-04-01 DIAGNOSIS — R29898 Other symptoms and signs involving the musculoskeletal system: Secondary | ICD-10-CM

## 2020-04-01 LAB — BASIC METABOLIC PANEL
BUN: 17 mg/dL (ref 6–23)
CO2: 34 mEq/L — ABNORMAL HIGH (ref 19–32)
Calcium: 9.7 mg/dL (ref 8.4–10.5)
Chloride: 102 mEq/L (ref 96–112)
Creatinine, Ser: 0.9 mg/dL (ref 0.40–1.50)
GFR: 82.15 mL/min (ref >60.00)
Glucose, Bld: 99 mg/dL (ref 70–99)
Potassium: 4.9 mEq/L (ref 3.5–5.1)
Sodium: 141 mEq/L (ref 135–145)

## 2020-04-01 MED ORDER — TETANUS-DIPHTH-ACELL PERTUSSIS 5-2.5-18.5 LF-MCG/0.5 IM SUSP: 0.5000 mL | Freq: Once | INTRAMUSCULAR | 0 refills | Status: AC

## 2020-04-01 NOTE — Progress Notes (Signed)
Subjective:    Patient ID: Martin Chase, male    DOB: 1945-12-22, 74 y.o.   MRN: [id number]  DOS:  04/01/2020 Type of visit - description: Follow-up Today we discussed several issues: Dementia: See seems to be doing well, no behavioral issues. Hypertension Back pain: The patient reports a long history of back pain, has seen [facility] orthopedic before, was told he has DJD, currently managing conservatively. No radiation, no lower extremity paresthesias, no falls but he feels his legs are getting weaker, has a difficult time getting up from a chair. Prostate cancer: S/p XRT currently doing well with no LUTS  Wt Readings from Last 3 Encounters:  04/01/20 213 lb 6 oz (96.8 kg)  12/01/19 217 lb 2 oz (98.5 kg)  11/03/19 215 lb (97.5 kg)     Review of Systems See above   Past Medical History:  Diagnosis Date  . Allergy   . Anemia 1996   due to a bleeding from aspirin  . Arthritis   . Cataract   . History of blood transfusion 1996  . HOH (hard of hearing)    uses hearing aids/ hearing on left side worse  . Hyperlipidemia   . Hypertension   . Prediabetes 10/20/2014  . Prostate cancer ([place])   . Sleep apnea    uses c-pap    Past Surgical History:  Procedure Laterality Date  . COLONOSCOPY WITH PROPOFOL N/A 09/16/2018   Procedure: COLONOSCOPY WITH PROPOFOL;  Surgeon: Yetta Flock, MD;  Location: [facility];  Service: Gastroenterology;  Laterality: N/A;  . POLYPECTOMY  09/16/2018   Procedure: POLYPECTOMY;  Surgeon: Yetta Flock, MD;  Location: [facility];  Service: Gastroenterology;;  . Lia Foyer INJECTION  09/16/2018   Procedure: SUBMUCOSAL INJECTION;  Surgeon: Yetta Flock, MD;  Location: [facility];  Service: Gastroenterology;;  . TONSILLECTOMY    . VASECTOMY      Allergies as of 04/01/2020   No Known Allergies     Medication List       Accurate as of Apr 01, 2020 11:59 PM. If you have any questions, ask your nurse or doctor.          atorvastatin 20 MG tablet Commonly known as: LIPITOR Take 1 tablet (20 mg total) by mouth daily.   CALCIUM 1200 PO Take by mouth.   donepezil 10 MG tablet Commonly known as: Aricept Take 1 tablet (10 mg total) by mouth at bedtime.   losartan 50 MG tablet Commonly known as: COZAAR Take 1 tablet (50 mg total) by mouth daily.   multivitamin with minerals Tabs tablet Take 1 tablet by mouth daily. Centrum   tamsulosin 0.4 MG Caps capsule Commonly known as: FLOMAX Take 0.4 mg by mouth daily after supper.   Tdap 5-2.5-18.5 LF-MCG/0.5 injection Commonly known as: BOOSTRIX Inject 0.5 mLs into the muscle once for 1 dose. Started by: [name], MD   Vitamin B-12 5000 MCG Tbdp Take 5,000 mcg by mouth daily.   vitamin C 1000 MG tablet Take 2,000 mg by mouth daily.   Vitamin D3 50 MCG (2000 UT) Tabs Take 2,000 Units by mouth daily.   vitamin E 180 MG (400 UNITS) capsule Take 800 Units by mouth daily.          Objective:   Physical Exam BP (!) 142/44 (BP Location: Left Arm, Patient Position: Sitting, Cuff Size: Normal)   Pulse (!) 59   Temp (!) 96.7 F (35.9 C) (Temporal)   Resp 18   Ht  5\' 11"  (1.803 m)   Wt 213 lb 6 oz (96.8 kg)   SpO2 100%   BMI 29.76 kg/m  General:   Well developed, NAD, BMI noted. HEENT:  Normocephalic . Face symmetric, atraumatic Neck: Normal carotid pulses Lungs:  CTA B Normal respiratory effort, no intercostal retractions, no accessory muscle use. Heart: RRR,  no murmur.  Lower extremities: no pretibial edema bilaterally  Skin: Not pale. Not jaundice Neurologic:  alert & oriented to self, place and time.  Speech normal, gait appropriate for age and unassisted. Motor symmetric Psych--  Cognition and judgment appear intact.  Cooperative with normal attention span and concentration.  Behavior appropriate. No anxious or depressed appearing.      Assessment     Assessment Prediabetes HTN ([facility] ~ [facility]) Hyperlipidemia DJD -  chronic back pain Allergies PUD per pt, dx early 2000s, related to ASA, was told not to take Prostate cancer: DX 12-2018, UroLift, finished external radiation treatment 2020 OSA, on CPAP Dementia: [id number]: Normal RPR, folic acid and 123456.  123456: Brain MRI normal for age.  No acute.  PLAN Here with his wife Dementia: Since the last time he started Aricept, doing well, no behavioral issues, not losing ground.  No apparent side effects.  No change HTN: Currently on losartan, check a BMP, ambulatory BPs in the 130s. Chronic back pain: Has not mentioned this before but he has chronic back pain as described in the HPI, no radiation.  Does report weakness in the lower extremities, having difficult time getting up from the chair. For now he feels satisfied with conservative treatment for pain with occasional Tylenol and a heating pad. Given history of prostate cancer we get an x-ray. Also outpatient PT referral to decrease risk of falls and regain some lower extremity strength. Prostate cancer: Finished external XRT 2020, doing well. Preventive care: Tdap Rx printed. TC 10/2020 CPX     This visit occurred during the SARS-CoV-2 public health emergency.  Safety protocols were in place, including screening questions prior to the visit, additional usage of staff PPE, and extensive cleaning of exam room while observing appropriate contact time as indicated for disinfecting solutions.

## 2020-04-01 NOTE — Progress Notes (Signed)
Pre visit review using our clinic review tool, if applicable. No additional management support is needed unless otherwise documented below in the visit note. 

## 2020-04-01 NOTE — Patient Instructions (Addendum)
Check the  blood pressure weekly BP GOAL is between 110/65 and  135/85. If it is consistently higher or lower, let me know     GO TO THE LAB : Get the blood work     [place], [place] Come back for a physical exam by December 2021  Stop by the first floor, get your x-ray   Fall Prevention in the Home, Adult Falls can cause injuries and can affect people from all age groups. There are many simple things that you can do to make your home safe and to help prevent falls. Ask for help when making these changes, if needed. What actions can I take to prevent falls? General instructions  Use good lighting in all rooms. Replace any light bulbs that burn out.  Turn on lights if it is dark. Use night-lights.  Place frequently used items in easy-to-reach places. Lower the shelves around your home if necessary.  Set up furniture so that there are clear paths around it. Avoid moving your furniture around.  Remove throw rugs and other tripping hazards from the floor.  Avoid walking on wet floors.  Fix any uneven floor surfaces.  Add color or contrast paint or tape to grab bars and handrails in your home. Place contrasting color strips on the first and last steps of stairways.  When you use a stepladder, make sure that it is completely opened and that the sides are firmly locked. Have someone hold the ladder while you are using it. Do not climb a closed stepladder.  Be aware of any and all pets. What can I do in the bathroom?      Keep the floor dry. Immediately clean up any water that spills onto the floor.  Remove soap buildup in the tub or shower on a regular basis.  Use non-skid mats or decals on the floor of the tub or shower.  Attach bath mats securely with double-sided, non-slip rug tape.  If you need to sit down while you are in the shower, use a plastic, non-slip stool.  Install grab bars by the toilet and in the tub and shower. Do  not use towel bars as grab bars. What can I do in the bedroom?  Make sure that a bedside light is easy to reach.  Do not use oversized bedding that drapes onto the floor.  Have a firm chair that has side arms to use for getting dressed. What can I do in the kitchen?  Clean up any spills right away.  If you need to reach for something above you, use a sturdy step stool that has a grab bar.  Keep electrical cables out of the way.  Do not use floor polish or wax that makes floors slippery. If you must use wax, make sure that it is non-skid floor wax. What can I do in the stairways?  Do not leave any items on the stairs.  Make sure that you have a light switch at the top of the stairs and the bottom of the stairs. Have them installed if you do not have them.  Make sure that there are handrails on both sides of the stairs. Fix handrails that are broken or loose. Make sure that handrails are as long as the stairways.  Install non-slip stair treads on all stairs in your home.  Avoid having throw rugs at the top or bottom of stairways, or secure the rugs with carpet tape to prevent them  from moving.  Choose a carpet design that does not hide the edge of steps on the stairway.  Check any carpeting to make sure that it is firmly attached to the stairs. Fix any carpet that is loose or worn. What can I do on the outside of my home?  Use bright outdoor lighting.  Regularly repair the edges of walkways and driveways and fix any cracks.  Remove high doorway thresholds.  Trim any shrubbery on the main path into your home.  Regularly check that handrails are securely fastened and in good repair. Both sides of any steps should have handrails.  Install guardrails along the edges of any raised decks or porches.  Clear walkways of debris and clutter, including tools and rocks.  Have leaves, snow, and ice cleared regularly.  Use sand or salt on walkways during winter months.  In the  garage, clean up any spills right away, including grease or oil spills. What other actions can I take?  Wear closed-toe shoes that fit well and support your feet. Wear shoes that have rubber soles or low heels.  Use mobility aids as needed, such as canes, walkers, scooters, and crutches.  Review your medicines with your health care provider. Some medicines can cause dizziness or changes in blood pressure, which increase your risk of falling. Talk with your health care provider about other ways that you can decrease your risk of falls. This may include working with a physical therapist or trainer to improve your strength, balance, and endurance. Where to find more information  Centers for Disease Control and Prevention, STEADI: WebmailGuide.co.za  [employer] on Aging: BrainJudge.co.uk Contact a health care provider if:  You are afraid of falling at home.  You feel weak, drowsy, or dizzy at home.  You fall at home. Summary  There are many simple things that you can do to make your home safe and to help prevent falls.  Ways to make your home safe include removing tripping hazards and installing grab bars in the bathroom.  Ask for help when making these changes in your home. This information is not intended to replace advice given to you by your health care provider. Make sure you discuss any questions you have with your health care provider. Document Revised: 10/05/2017 Document Reviewed: 06/07/2017 Elsevier Patient Education  2020 [employer].

## 2020-04-04 DIAGNOSIS — G8929 Other chronic pain: Secondary | ICD-10-CM | POA: Insufficient documentation

## 2020-04-04 NOTE — Assessment & Plan Note (Signed)
Here with his wife Dementia: Since the last time he started Aricept, doing well, no behavioral issues, not losing ground.  No apparent side effects.  No change HTN: Currently on losartan, check a BMP, ambulatory BPs in the 130s. Chronic back pain: Has not mentioned this before but he has chronic back pain as described in the HPI, no radiation.  Does report weakness in the lower extremities, having difficult time getting up from the chair. For now he feels satisfied with conservative treatment for pain with occasional Tylenol and a heating pad. Given history of prostate cancer we get an x-ray. Also outpatient PT referral to decrease risk of falls and regain some lower extremity strength. Prostate cancer: Finished external XRT 2020, doing well. Preventive care: Tdap Rx printed. TC 10/2020 CPX

## 2020-04-13 DIAGNOSIS — G4733 Obstructive sleep apnea (adult) (pediatric): Secondary | ICD-10-CM | POA: Diagnosis not present

## 2020-04-22 ENCOUNTER — Ambulatory Visit: Payer: Medicare Other | Admitting: Pulmonary Disease

## 2020-04-22 ENCOUNTER — Other Ambulatory Visit: Payer: Self-pay

## 2020-04-22 ENCOUNTER — Encounter: Payer: Self-pay | Admitting: Pulmonary Disease

## 2020-04-22 VITALS — BP 138/54 | HR 60 | Temp 98.1°F | Ht 71.0 in | Wt 215.2 lb

## 2020-04-22 DIAGNOSIS — Z9989 Dependence on other enabling machines and devices: Secondary | ICD-10-CM | POA: Diagnosis not present

## 2020-04-22 DIAGNOSIS — G4733 Obstructive sleep apnea (adult) (pediatric): Secondary | ICD-10-CM | POA: Diagnosis not present

## 2020-04-22 NOTE — Progress Notes (Signed)
Subjective:     Patient ID: Martin Chase, male   DOB: 1945-11-22, 74 y.o.   MRN: [id number]  History of obstructive sleep apnea diagnosed about 10 years ago  Severe obstructive sleep apnea with very good compliance with CPAP No significant health issues since the last time he was here  Continues to benefit from CPAP. Compliance continues to be great with good benefit  Denies dryness of his mouth in the mornings No morning headaches Memory continues to be stable Weight is controlled  Usually goes to bed about 10 PM, final awakening time about 8 AM Wakes up about twice during the night to use the restroom  No family history of obstructive sleep apnea  Occasional difficulty staying asleep  Past Medical History:  Diagnosis Date   Allergy    Anemia 1996   due to a bleeding from aspirin   Arthritis    Cataract    History of blood transfusion 1996   HOH (hard of hearing)    uses hearing aids/ hearing on left side worse   Hyperlipidemia    Hypertension    Prediabetes 10/20/2014   Prostate cancer ([place])    Sleep apnea    uses c-pap   Social History   Socioeconomic History   Marital status: Married    Spouse name: Patsy   Number of children: 0   Years of education: Not on file   Highest education level: Not on file  Occupational History   Occupation: retired, [employer] scientist  Tobacco Use   Smoking status: Former Smoker    Packs/day: 1.00    Years: 5.00    Pack years: 5.00    Types: Cigarettes    Quit date: 01/29/1986    Years since quitting: 34.2   Smokeless tobacco: Never Used  Vaping Use   Vaping Use: Never used  Substance and Sexual Activity   Alcohol use: Yes    Alcohol/week: 7.0 standard drinks    Types: 7 Glasses of wine per week    Comment: 1 glass of wine with dinner   Drug use: No   Sexual activity: Not Currently  Other Topics Concern   Not on file  Social History Narrative   Household -- pt and wife   Social  Determinants of Health   Financial Resource Strain:    Difficulty of Paying Living Expenses:   Food Insecurity:    Worried About Charity fundraiser in the Last Year:    Arboriculturist in the Last Year:   Transportation Needs:    Film/video editor (Medical):    Lack of Transportation (Non-Medical):   Physical Activity:    Days of Exercise per Week:    Minutes of Exercise per Session:   Stress:    Feeling of Stress :   Social Connections:    Frequency of Communication with Friends and Family:    Frequency of Social Gatherings with Friends and Family:    Attends Religious Services:    Active Member of Clubs or Organizations:    Attends Music therapist:    Marital Status:   Intimate Partner Violence:    Fear of Current or Ex-Partner:    Emotionally Abused:    Physically Abused:    Sexually Abused:    Family History  Problem Relation Age of Onset   Diabetes Mother    Alzheimer's disease Mother    Prostate cancer Father 56       prostatectomy?   Diabetes  Maternal Uncle    Stroke Paternal Uncle    Diabetes Maternal Grandmother    Heart disease Maternal Grandfather    Suicidality Brother    Colon cancer Neg Hx    Rectal cancer Neg Hx    Esophageal cancer Neg Hx    Stomach cancer Neg Hx      Review of Systems  Constitutional: Negative for fever and unexpected weight change.  HENT: Negative for congestion, dental problem, ear pain, nosebleeds, postnasal drip, rhinorrhea, sinus pressure, sneezing, sore throat and trouble swallowing.   Eyes: Negative for redness and itching.  Respiratory: Negative for cough, chest tightness, shortness of breath and wheezing.   Cardiovascular: Negative for palpitations and leg swelling.  Gastrointestinal: Negative for nausea and vomiting.  Genitourinary: Negative for dysuria.  Musculoskeletal: Negative for joint swelling.  Skin: Negative for rash.  Allergic/Immunologic: Negative.  Negative  for environmental allergies, food allergies and immunocompromised state.  Neurological: Negative for headaches.  Hematological: Does not bruise/bleed easily.  Psychiatric/Behavioral: Negative for dysphoric mood. The patient is not nervous/anxious.        Objective:   Physical Exam Constitutional:      Appearance: Normal appearance.  HENT:     Head: Normocephalic and atraumatic.     Nose: Nose normal.     Mouth/Throat:     Mouth: Mucous membranes are moist.     Comments: Mallampati 3, crowded oropharynx Eyes:     General:        Right eye: No discharge.     Extraocular Movements: Extraocular movements intact.     Pupils: Pupils are equal, round, and reactive to light.  Cardiovascular:     Rate and Rhythm: Normal rate and regular rhythm.     Pulses: Normal pulses.     Heart sounds: Normal heart sounds. No murmur heard.   Pulmonary:     Effort: Pulmonary effort is normal. No respiratory distress.     Breath sounds: Normal breath sounds. No stridor. No wheezing or rhonchi.  Musculoskeletal:     Cervical back: No rigidity. No muscular tenderness.  Neurological:     Mental Status: He is alert.    Results of the Epworth flowsheet 07/21/2019  Sitting and reading 3  Watching TV 3  Sitting, inactive in a public place (e.g. a theatre or a meeting) 0  As a passenger in a car for an hour without a break 0  Lying down to rest in the afternoon when circumstances permit 3  Sitting and talking to someone 0  Sitting quietly after a lunch without alcohol 0  In a car, while stopped for a few minutes in traffic 0  Total score 9      Assessment:     Obstructive sleep apnea -Compliant with CPAP -continues to benefit from CPAP  Obesity  Daytime sleepiness    Plan:     Severe obstructive sleep apnea well treated with CPAP  He continues to feel great and continues to be very compliant with CPAP No changes need made at present Continue auto titrating CPAP I will see him back in a  year Encouraged to call with any significant concerns

## 2020-04-22 NOTE — Patient Instructions (Signed)
Obstructive sleep apnea adequately treated with CPAP therapy  Continue using CPAP on a regular basis  I will see you back in about a year Call with concerns

## 2020-04-23 DIAGNOSIS — G4733 Obstructive sleep apnea (adult) (pediatric): Secondary | ICD-10-CM | POA: Diagnosis not present

## 2020-04-27 ENCOUNTER — Encounter: Payer: Self-pay | Admitting: Gastroenterology

## 2020-04-29 ENCOUNTER — Encounter: Payer: Self-pay | Admitting: Internal Medicine

## 2020-05-23 DIAGNOSIS — G4733 Obstructive sleep apnea (adult) (pediatric): Secondary | ICD-10-CM | POA: Diagnosis not present

## 2020-05-25 ENCOUNTER — Encounter: Payer: Self-pay | Admitting: Internal Medicine

## 2020-05-25 ENCOUNTER — Ambulatory Visit (INDEPENDENT_AMBULATORY_CARE_PROVIDER_SITE_OTHER): Payer: Medicare Other | Admitting: Internal Medicine

## 2020-05-25 ENCOUNTER — Other Ambulatory Visit: Payer: Self-pay

## 2020-05-25 VITALS — BP 152/72 | HR 61 | Temp 98.4°F | Resp 18 | Ht 71.0 in | Wt 213.2 lb

## 2020-05-25 DIAGNOSIS — R197 Diarrhea, unspecified: Secondary | ICD-10-CM | POA: Diagnosis not present

## 2020-05-25 NOTE — Progress Notes (Signed)
Subjective:    Patient ID: Martin Chase, male    DOB: January 01, 1946, 74 y.o.   MRN: [id number]  DOS:  05/25/2020 Type of visit - description: Acute, here with his wife The patient reports he had diarrhea 3 days ago, lasted 48 hours and now is better. Stools are described as watery, no blood or mucus in the stools. No other family members affected.  The wife has a somewhat different story, she agrees with above but also is telling me that since he finished XRT, his bowel movements are not the same and he has on and off  diarrhea for months.  Wt Readings from Last 3 Encounters:  05/25/20 213 lb 4 oz (96.7 kg)  04/22/20 215 lb 3.2 oz (97.6 kg)  04/01/20 213 lb 6 oz (96.8 kg)     Review of Systems No fever chills No nausea vomiting No abdominal pain No recent antibiotics Denies any rectal pain with BMs.  Past Medical History:  Diagnosis Date  . Allergy   . Anemia 1996   due to a bleeding from aspirin  . Arthritis   . Cataract   . History of blood transfusion 1996  . HOH (hard of hearing)    uses hearing aids/ hearing on left side worse  . Hyperlipidemia   . Hypertension   . Prediabetes 10/20/2014  . Prostate cancer ([place])   . Sleep apnea    uses c-pap    Past Surgical History:  Procedure Laterality Date  . COLONOSCOPY WITH PROPOFOL N/A 09/16/2018   Procedure: COLONOSCOPY WITH PROPOFOL;  Surgeon: Yetta Flock, MD;  Location: [facility];  Service: Gastroenterology;  Laterality: N/A;  . POLYPECTOMY  09/16/2018   Procedure: POLYPECTOMY;  Surgeon: Yetta Flock, MD;  Location: [facility];  Service: Gastroenterology;;  . Lia Foyer INJECTION  09/16/2018   Procedure: SUBMUCOSAL INJECTION;  Surgeon: Yetta Flock, MD;  Location: [facility];  Service: Gastroenterology;;  . TONSILLECTOMY    . VASECTOMY      Allergies as of 05/25/2020   No Known Allergies     Medication List       Accurate as of May 25, 2020  3:02 PM. If you have any  questions, ask your nurse or doctor.        atorvastatin 20 MG tablet Commonly known as: LIPITOR Take 1 tablet (20 mg total) by mouth daily.   CALCIUM 1200 PO Take by mouth.   donepezil 10 MG tablet Commonly known as: Aricept Take 1 tablet (10 mg total) by mouth at bedtime.   losartan 50 MG tablet Commonly known as: COZAAR Take 1 tablet (50 mg total) by mouth daily.   multivitamin with minerals Tabs tablet Take 1 tablet by mouth daily. Centrum   tamsulosin 0.4 MG Caps capsule Commonly known as: FLOMAX Take 0.4 mg by mouth daily after supper.   Vitamin B-12 5000 MCG Tbdp Take 5,000 mcg by mouth daily.   vitamin C 1000 MG tablet Take 2,000 mg by mouth daily.   Vitamin D3 50 MCG (2000 UT) Tabs Take 2,000 Units by mouth daily.   vitamin E 180 MG (400 UNITS) capsule Take 800 Units by mouth daily.          Objective:   Physical Exam BP (!) 152/72 (BP Location: Right Arm, Patient Position: Sitting, Cuff Size: Small)   Pulse 61   Temp 98.4 F (36.9 C) (Oral)   Resp 18   Ht 5\' 11"  (1.803 m)   Wt 213 lb  4 oz (96.7 kg)   SpO2 96%   BMI 29.74 kg/m  General:   Well developed, NAD, BMI noted.  HEENT:  Normocephalic . Face symmetric, atraumatic Lungs:  CTA B Normal respiratory effort, no intercostal retractions, no accessory muscle use. Heart: RRR,  no murmur.  Abdomen:  Not distended, soft, non-tender. No rebound or rigidity.   Skin: Not pale. Not jaundice Lower extremities: no pretibial edema bilaterally  Neurologic:  alert & oriented X3.  Speech normal, gait appropriate for age and unassisted Psych--  Cognition and judgment appear intact.  Cooperative with normal attention span and concentration.  Behavior appropriate. No anxious or depressed appearing.     Assessment     Assessment Prediabetes HTN (dx @ [facility] ~ 01/2018) Hyperlipidemia DJD - chronic back pain Allergies PUD per pt, dx early 2000s, related to ASA, was told not to take Prostate  cancer: DX 12-2018, UroLift, finished external radiation treatment ~ 07/2019 OSA, on CPAP Dementia: 37/8588: Normal RPR, folic acid and F02.  05-7411: Brain MRI normal for age.  No acute.  PLAN Diarrhea: Watery stools, with no blood or mucus.  According to the patient sxs  lasted 48 hours and is feeling better, according to the wife his BMs are not normal since he had radiation therapy for prostate cancer which finished in ~ September 2020. Does not have rectal pain, blood in the stools or mucus thus proctitis is unlikely. Plan: Stools for C. difficile, Hemoccult and WBCs. Good hydration, call if symptoms persist. He actually has is due for a colonoscopy scheduled for next month.   This visit occurred during the SARS-CoV-2 public health emergency.  Safety protocols were in place, including screening questions prior to the visit, additional usage of staff PPE, and extensive cleaning of exam room while observing appropriate contact time as indicated for disinfecting solutions.

## 2020-05-25 NOTE — Patient Instructions (Signed)
Go to the lab and get a container for stools.  Bring the stools only when / if they are watery.  Also bring back the stool cards to see if there is blood in the stools.  Drink plenty of fluids, call if you continue with diarrhea

## 2020-05-25 NOTE — Progress Notes (Signed)
Pre visit review using our clinic review tool, if applicable. No additional management support is needed unless otherwise documented below in the visit note. 

## 2020-05-26 ENCOUNTER — Other Ambulatory Visit: Payer: Self-pay | Admitting: Internal Medicine

## 2020-05-26 NOTE — Assessment & Plan Note (Signed)
Diarrhea: Watery stools, with no blood or mucus.  According to the patient sxs  lasted 48 hours and is feeling better, according to the wife his BMs are not normal since he had radiation therapy for prostate cancer which finished in ~ September 2020. Does not have rectal pain, blood in the stools or mucus thus proctitis is unlikely. Plan: Stools for C. difficile, Hemoccult and WBCs. Good hydration, call if symptoms persist. He actually has is due for a colonoscopy scheduled for next month.

## 2020-06-07 ENCOUNTER — Telehealth: Payer: Self-pay | Admitting: *Deleted

## 2020-06-07 NOTE — Telephone Encounter (Signed)
Dr. Havery Moros,  This pt is coming in for a PV on 06-16-20 and a colonoscopy on 06-22-20.  He saw his PCP on 05-25-20 for diarrhea and Dr. Larose Kells ordered several stool studies, including C Diff.  The pt never completed these studies. I spoke with his wife, who states that pt had diarrhea for about 2 months, and now has soft stools. No further diarrhea at this time.  He has not been on recent antibiotics.  How would you like to proceed?  Thanks, [employer]

## 2020-06-07 NOTE — Telephone Encounter (Signed)
If his diarrhea has resolved then I think okay to proceed with colonoscopy as scheduled. If something changes in the interim he should contact us. Thanks [employer]

## 2020-06-08 NOTE — Telephone Encounter (Signed)
noted 

## 2020-06-16 ENCOUNTER — Ambulatory Visit (AMBULATORY_SURGERY_CENTER): Payer: Self-pay | Admitting: *Deleted

## 2020-06-16 ENCOUNTER — Other Ambulatory Visit: Payer: Self-pay

## 2020-06-16 ENCOUNTER — Encounter: Payer: Self-pay | Admitting: Gastroenterology

## 2020-06-16 VITALS — Ht 71.0 in | Wt 208.0 lb

## 2020-06-16 DIAGNOSIS — Z86010 Personal history of colonic polyps: Secondary | ICD-10-CM

## 2020-06-16 MED ORDER — SUTAB 1479-225-188 MG PO TABS: 1.0000 | ORAL_TABLET | ORAL | 0 refills | Status: DC

## 2020-06-16 NOTE — Progress Notes (Signed)
Patient and wife is here in-person for PV. Patient denies any allergies to eggs or soy. Patient denies any problems with anesthesia/sedation. Patient denies any oxygen use at home. Patient denies taking any diet/weight loss medications or blood thinners. Patient is not being treated for MRSA or C-diff. Patient is aware of our care-partner policy and QMGNO-03 safety protocol. EMMI education assisgned to the patient for the procedure, this was explained and instructions given to patient. Patient denies any diarrhea at this time, he states BM's are normal.   COVID-19 vaccines completed per pt.  Prep Prescription coupon given to the patient.

## 2020-06-22 ENCOUNTER — Other Ambulatory Visit: Payer: Self-pay

## 2020-06-22 ENCOUNTER — Ambulatory Visit (AMBULATORY_SURGERY_CENTER): Payer: Medicare Other | Admitting: Gastroenterology

## 2020-06-22 ENCOUNTER — Encounter: Payer: Self-pay | Admitting: Gastroenterology

## 2020-06-22 VITALS — BP 115/58 | HR 70 | Temp 96.9°F | Resp 12 | Ht 71.0 in | Wt 208.0 lb

## 2020-06-22 DIAGNOSIS — Z86010 Personal history of colonic polyps: Secondary | ICD-10-CM

## 2020-06-22 DIAGNOSIS — D122 Benign neoplasm of ascending colon: Secondary | ICD-10-CM

## 2020-06-22 DIAGNOSIS — D123 Benign neoplasm of transverse colon: Secondary | ICD-10-CM | POA: Diagnosis not present

## 2020-06-22 DIAGNOSIS — D128 Benign neoplasm of rectum: Secondary | ICD-10-CM

## 2020-06-22 DIAGNOSIS — R197 Diarrhea, unspecified: Secondary | ICD-10-CM | POA: Diagnosis not present

## 2020-06-22 MED ORDER — SODIUM CHLORIDE 0.9 % IV SOLN
500.0000 mL | INTRAVENOUS | Status: DC
Start: 2020-06-22 — End: 2020-06-22

## 2020-06-22 NOTE — Op Note (Signed)
[place] Patient Name: Martin Chase Procedure Date: 06/22/2020 1:28 PM MRN: [id number] Endoscopist: Remo Lipps P. Havery Moros , MD Age: 74 Referring MD:  Date of Birth: [birth year] Gender: Male Account #: [id number] Procedure:                Colonoscopy Indications:              High risk colon cancer surveillance: Personal                            history of colonic polyps - numerous polyps (> 20                            adenomas, including one advanced needing EMR) in                            2019 to 2020 Medicines:                Monitored Anesthesia Care Procedure:                Pre-Anesthesia Assessment:                           - Prior to the procedure, a History and Physical                            was performed, and patient medications and                            allergies were reviewed. The patient's tolerance of                            previous anesthesia was also reviewed. The risks                            and benefits of the procedure and the sedation                            options and risks were discussed with the patient.                            All questions were answered, and informed consent                            was obtained. Prior Anticoagulants: The patient has                            taken no previous anticoagulant or antiplatelet                            agents. ASA Grade Assessment: III - A patient with                            severe systemic disease. After reviewing the risks  and benefits, the patient was deemed in                            satisfactory condition to undergo the procedure.                           After obtaining informed consent, the colonoscope                            was passed under direct vision. Throughout the                            procedure, the patient's blood pressure, pulse, and                            oxygen saturations were monitored continuously.  The                            Colonoscope was introduced through the anus and                            advanced to the the cecum, identified by                            appendiceal orifice and ileocecal valve. The                            colonoscopy was performed without difficulty. The                            patient tolerated the procedure well. The quality                            of the bowel preparation was good. The ileocecal                            valve, appendiceal orifice, and rectum were                            photographed. Scope In: 1:31:24 PM Scope Out: 1:53:52 PM Scope Withdrawal Time: 0 hours 19 minutes 17 seconds  Total Procedure Duration: 0 hours 22 minutes 28 seconds  Findings:                 The perianal and digital rectal examinations were                            normal.                           A diminutive polyp was found in the ascending                            colon. The polyp was sessile. The polyp was removed  with a cold biopsy forceps. Resection and retrieval                            were complete.                           A few small-mouthed diverticula were found in the                            ascending colon and left colon.                           Two sessile polyps were found in the transverse                            colon. The polyps were 3 to 5 mm in size. These                            polyps were removed with a cold snare. Resection                            and retrieval were complete.                           A 2 to 3 mm polyp was found in the rectum. The                            polyp was sessile. The polyp was removed with a                            cold snare. Resection and retrieval were complete.                           Internal hemorrhoids were found during                            retroflexion. The hemorrhoids were small.                           The exam was  otherwise without abnormality. Complications:            No immediate complications. Estimated blood loss:                            Minimal. Estimated Blood Loss:     Estimated blood loss was minimal. Impression:               - One diminutive polyp in the ascending colon,                            removed with a cold biopsy forceps. Resected and                            retrieved.                           -  Diverticulosis in the ascending colon and in the                            left colon.                           - Two 3 to 5 mm polyps in the transverse colon,                            removed with a cold snare. Resected and retrieved.                           - One 2 to 3 mm polyp in the rectum, removed with a                            cold snare. Resected and retrieved.                           - Internal hemorrhoids.                           - The examination was otherwise normal. Recommendation:           - Patient has a contact number available for                            emergencies. The signs and symptoms of potential                            delayed complications were discussed with the                            patient. Return to normal activities tomorrow.                            Written discharge instructions were provided to the                            patient.                           - Resume previous diet.                           - Continue present medications.                           - Await pathology results. Repeat colonoscopy in 3                            years given significant burden of adenomas on prior                            exams in 2019 and [place]. Maureen Delatte, MD 06/22/2020 2:00:30 PM This report has been signed electronically.

## 2020-06-22 NOTE — Progress Notes (Signed)
Called to room to assist during endoscopic procedure.  Patient ID and intended procedure confirmed with present staff. Received instructions for my participation in the procedure from the performing physician.  

## 2020-06-22 NOTE — Patient Instructions (Signed)

## 2020-06-22 NOTE — Progress Notes (Signed)
Dr A aware of ephedrine  

## 2020-06-22 NOTE — Progress Notes (Signed)
Report to PACU, RN, vss, BBS= Clear.  

## 2020-06-22 NOTE — Progress Notes (Signed)
Vs cw I have reviewed the patient's medical history in detail and updated the computerized patient record.

## 2020-06-23 DIAGNOSIS — G4733 Obstructive sleep apnea (adult) (pediatric): Secondary | ICD-10-CM | POA: Diagnosis not present

## 2020-06-24 ENCOUNTER — Telehealth: Payer: Self-pay

## 2020-06-24 NOTE — Telephone Encounter (Signed)
  Follow up Call-  Call back number 06/22/2020 04/21/2019 08/23/2018  Post procedure Call Back phone  # [phone]  Permission to leave phone message Yes Yes Yes  Some recent data might be hidden     Patient questions:  Do you have a fever, pain , or abdominal swelling? No. Pain Score  0 *  Have you tolerated food without any problems? No.  Have you been able to return to your normal activities? No.  Do you have any questions about your discharge instructions: Diet   No. Medications  No. Follow up visit  No.  Do you have questions or concerns about your Care? No.  Actions: * If pain score is 4 or above: No action needed, pain <4.  1. Have you developed a fever since your procedure? no  2.   Have you had an respiratory symptoms (SOB or cough) since your procedure? no  3.   Have you tested positive for COVID 19 since your procedure no  4.   Have you had any family members/close contacts diagnosed with the COVID 19 since your procedure?  no   If yes to any of these questions please route to Joylene John, RN and Joella Prince, RN

## 2020-06-30 ENCOUNTER — Other Ambulatory Visit: Payer: Self-pay | Admitting: Internal Medicine

## 2020-07-13 ENCOUNTER — Other Ambulatory Visit: Payer: Self-pay

## 2020-07-13 ENCOUNTER — Encounter: Payer: Self-pay | Admitting: Internal Medicine

## 2020-07-13 ENCOUNTER — Ambulatory Visit (INDEPENDENT_AMBULATORY_CARE_PROVIDER_SITE_OTHER): Payer: Medicare Other | Admitting: Internal Medicine

## 2020-07-13 VITALS — BP 145/71 | HR 58 | Temp 98.1°F | Resp 16 | Ht 71.0 in | Wt 207.0 lb

## 2020-07-13 DIAGNOSIS — R55 Syncope and collapse: Secondary | ICD-10-CM | POA: Diagnosis not present

## 2020-07-13 DIAGNOSIS — R296 Repeated falls: Secondary | ICD-10-CM | POA: Diagnosis not present

## 2020-07-13 DIAGNOSIS — F0390 Unspecified dementia without behavioral disturbance: Secondary | ICD-10-CM

## 2020-07-13 DIAGNOSIS — R197 Diarrhea, unspecified: Secondary | ICD-10-CM | POA: Diagnosis not present

## 2020-07-13 DIAGNOSIS — F03A0 Unspecified dementia, mild, without behavioral disturbance, psychotic disturbance, mood disturbance, and anxiety: Secondary | ICD-10-CM

## 2020-07-13 NOTE — Patient Instructions (Addendum)
Continue checking your blood pressures  Please use a cane consistently  GO TO THE FRONT DESK, PLEASE SCHEDULE YOUR APPOINTMENTS Come back for a physical exam in 4 months

## 2020-07-13 NOTE — Progress Notes (Signed)
Pre visit review using our clinic review tool, if applicable. No additional management support is needed unless otherwise documented below in the visit note. 

## 2020-07-13 NOTE — Progress Notes (Signed)
Subjective:    Patient ID: Martin Chase, male    DOB: 1945-12-17, 74 y.o.   MRN: [id number]  DOS:  07/13/2020 Type of visit - description: Follow-up, here with his wife We discussed several issues:  Diarrhea: See last visit, improved.  5 days ago, he was about to start his breakfast, he was sitting in a stool, reports that his eyes got "fixed" and he started to slide backwards, did not fall because she helped him, did not lose consciousness, but he was not following commands briefly. The neighbor arrived, they lie him down on the floor. There was no focal weaknesses, no slurred speech, no seizure activity, he was a slightly confused. All this lasted few minutes. EMTs arrived, BP okay, CBG okay, was told her not to go to the ER due to Covid. No symptoms since then.  Falls: Since the last visit, had few other falls, no  head injury.  Review of Systems Denies chest pain, shortness of breath No nausea vomiting or blood in the stools. No cough  Past Medical History:  Diagnosis Date  . Allergy   . Anemia 1996   due to a bleeding from aspirin  . Arthritis   . Cataract   . History of blood transfusion 1996  . HOH (hard of hearing)    uses hearing aids/ hearing on left side worse  . Hyperlipidemia   . Hypertension   . Prediabetes 10/20/2014  . Prostate cancer ([place])   . Sleep apnea    uses c-pap    Past Surgical History:  Procedure Laterality Date  . COLONOSCOPY  07/30/2019  . COLONOSCOPY WITH PROPOFOL N/A 09/16/2018   Procedure: COLONOSCOPY WITH PROPOFOL;  Surgeon: Yetta Flock, MD;  Location: [facility];  Service: Gastroenterology;  Laterality: N/A;  . POLYPECTOMY  09/16/2018   Procedure: POLYPECTOMY;  Surgeon: Yetta Flock, MD;  Location: [facility];  Service: Gastroenterology;;  . POLYPECTOMY    . SUBMUCOSAL INJECTION  09/16/2018   Procedure: SUBMUCOSAL INJECTION;  Surgeon: Yetta Flock, MD;  Location: [facility];  Service:  Gastroenterology;;  . TONSILLECTOMY    . VASECTOMY      Allergies as of 07/13/2020   No Known Allergies     Medication List       Accurate as of July 13, 2020 11:59 PM. If you have any questions, ask your nurse or doctor.        atorvastatin 20 MG tablet Commonly known as: LIPITOR TAKE 1 TABLET BY MOUTH  DAILY   CALCIUM 1200 PO Take by mouth.   donepezil 10 MG tablet Commonly known as: ARICEPT Take 1 tablet (10 mg total) by mouth at bedtime.   losartan 50 MG tablet Commonly known as: COZAAR TAKE 1 TABLET BY MOUTH  DAILY   multivitamin with minerals Tabs tablet Take 1 tablet by mouth daily. Centrum   Sutab [phone] MG Tabs Generic drug: Sodium Sulfate-Mag Sulfate-KCl Take 1 kit by mouth as directed. BIN: [id number] PCN: CN GROUP: [id number] MEMBER ID: [id number];HCW AS CASH;NO PRIOR AUTHORIZATION   tamsulosin 0.4 MG Caps capsule Commonly known as: FLOMAX Take 0.4 mg by mouth daily after supper.   Vitamin B-12 5000 MCG Tbdp Take 5,000 mcg by mouth daily.   Vitamin D3 50 MCG (2000 UT) Tabs Take 2,000 Units by mouth daily.   vitamin E 180 MG (400 UNITS) capsule Take 800 Units by mouth daily.          Objective:   Physical Exam BP [name])  145/71 (BP Location: Left Arm, Patient Position: Sitting, Cuff Size: Small)   Pulse (!) 58   Temp 98.1 F (36.7 C) (Oral)   Resp 16   Ht _0  (1.803 m)   Wt 207 lb (93.9 kg)   SpO2 100%   BMI 28.87 kg/m  General:   Well developed, NAD, BMI noted. HEENT:  Normocephalic . Face symmetric, atraumatic Neck: Full range of motion Lungs:  CTA B Normal respiratory effort, no intercostal retractions, no accessory muscle use. Heart: RRR,  no murmur.  Lower extremities: no pretibial edema bilaterally  Skin: Not pale. Not jaundice Neurologic:  alert & pleasantly demented, follow commands Speech normal, gait and transferring somewhat unsteady, needed some help. Psych--  Behavior appropriate. No anxious or depressed  appearing.      Assessment     Assessment Prediabetes HTN (dx [facility] ~ 01/2018) Hyperlipidemia DJD - chronic back pain Allergies PUD per pt, dx early 2000s, related to ASA, was told not to take Prostate cancer: DX 12-2018, UroLift, finished external radiation treatment ~ 07/2019 OSA, on CPAP Dementia: 91/6945: Normal RPR, folic acid and W38.  06-8279: Brain MRI normal for age.  No acute.  PLAN Diarrhea: See last visit, since then, had a colonoscopy ,+  polyps.  Stool studies not done, still taking Aricept but symptoms have essentially subsided. Near syncope: As described above, vasovagal?  Absent seizure?  Discussed observation versus further evaluation, she requests evaluation, referred to neurology, seizure?  EEG?. Frequent falls: Still an issue, did not pursue PT because he is "going to the gym".  He did have a fall at the gym.  We agreed to do formal physical therapy, consistent use of a cane, avoid going to the gym point he uses a treadmill.  Risks of a fall discussed. RTC 4 months CPX  Time spent 30 minutes  This visit occurred during the SARS-CoV-2 public health emergency.  Safety protocols were in place, including screening questions prior to the visit, additional usage of staff PPE, and extensive cleaning of exam room while observing appropriate contact time as indicated for disinfecting solutions.

## 2020-07-14 NOTE — Assessment & Plan Note (Signed)
Diarrhea: See last visit, since then, had a colonoscopy ,+  polyps.  Stool studies not done, still taking Aricept but symptoms have essentially subsided. Near syncope: As described above, vasovagal?  Absent seizure?  Discussed observation versus further evaluation, she requests evaluation, referred to neurology, seizure?  EEG?. Frequent falls: Still an issue, did not pursue PT because he is "going to the gym".  He did have a fall at the gym.  We agreed to do formal physical therapy, consistent use of a cane, avoid going to the gym point he uses a treadmill.  Risks of a fall discussed. RTC 4 months CPX

## 2020-07-20 DIAGNOSIS — R2681 Unsteadiness on feet: Secondary | ICD-10-CM | POA: Diagnosis not present

## 2020-07-20 DIAGNOSIS — R262 Difficulty in walking, not elsewhere classified: Secondary | ICD-10-CM | POA: Diagnosis not present

## 2020-07-20 DIAGNOSIS — R278 Other lack of coordination: Secondary | ICD-10-CM | POA: Diagnosis not present

## 2020-07-20 DIAGNOSIS — M6281 Muscle weakness (generalized): Secondary | ICD-10-CM | POA: Diagnosis not present

## 2020-07-21 DIAGNOSIS — M6281 Muscle weakness (generalized): Secondary | ICD-10-CM | POA: Diagnosis not present

## 2020-07-21 DIAGNOSIS — R2681 Unsteadiness on feet: Secondary | ICD-10-CM | POA: Diagnosis not present

## 2020-07-21 DIAGNOSIS — R262 Difficulty in walking, not elsewhere classified: Secondary | ICD-10-CM | POA: Diagnosis not present

## 2020-07-21 DIAGNOSIS — R278 Other lack of coordination: Secondary | ICD-10-CM | POA: Diagnosis not present

## 2020-07-22 ENCOUNTER — Telehealth: Payer: Self-pay

## 2020-07-22 DIAGNOSIS — M6281 Muscle weakness (generalized): Secondary | ICD-10-CM | POA: Diagnosis not present

## 2020-07-22 DIAGNOSIS — R262 Difficulty in walking, not elsewhere classified: Secondary | ICD-10-CM | POA: Diagnosis not present

## 2020-07-22 DIAGNOSIS — R2681 Unsteadiness on feet: Secondary | ICD-10-CM | POA: Diagnosis not present

## 2020-07-22 DIAGNOSIS — R278 Other lack of coordination: Secondary | ICD-10-CM | POA: Diagnosis not present

## 2020-07-22 NOTE — Telephone Encounter (Signed)
Plan of care signed and mailed back to [facility] at [address] [place] [address]. Copy of form sent for scanning.

## 2020-07-24 DIAGNOSIS — G4733 Obstructive sleep apnea (adult) (pediatric): Secondary | ICD-10-CM | POA: Diagnosis not present

## 2020-07-28 DIAGNOSIS — G4733 Obstructive sleep apnea (adult) (pediatric): Secondary | ICD-10-CM | POA: Diagnosis not present

## 2020-07-29 DIAGNOSIS — G4733 Obstructive sleep apnea (adult) (pediatric): Secondary | ICD-10-CM | POA: Diagnosis not present

## 2020-07-30 DIAGNOSIS — M6281 Muscle weakness (generalized): Secondary | ICD-10-CM | POA: Diagnosis not present

## 2020-07-30 DIAGNOSIS — R2681 Unsteadiness on feet: Secondary | ICD-10-CM | POA: Diagnosis not present

## 2020-07-30 DIAGNOSIS — R262 Difficulty in walking, not elsewhere classified: Secondary | ICD-10-CM | POA: Diagnosis not present

## 2020-07-30 DIAGNOSIS — R278 Other lack of coordination: Secondary | ICD-10-CM | POA: Diagnosis not present

## 2020-08-02 ENCOUNTER — Telehealth: Payer: Self-pay | Admitting: Internal Medicine

## 2020-08-02 DIAGNOSIS — R262 Difficulty in walking, not elsewhere classified: Secondary | ICD-10-CM | POA: Diagnosis not present

## 2020-08-02 DIAGNOSIS — R278 Other lack of coordination: Secondary | ICD-10-CM | POA: Diagnosis not present

## 2020-08-02 DIAGNOSIS — R2681 Unsteadiness on feet: Secondary | ICD-10-CM | POA: Diagnosis not present

## 2020-08-02 DIAGNOSIS — M6281 Muscle weakness (generalized): Secondary | ICD-10-CM | POA: Diagnosis not present

## 2020-08-02 NOTE — Telephone Encounter (Signed)
Copied from [place] [id number]. Topic: Medicare AWV >> Aug 02, 2020 11:31 AM Cher Nakai R wrote: Reason for CRM: 9/27 LM to cancel appointment due to [facility] not available - will call patient back to reschedule when schedule opens

## 2020-08-04 DIAGNOSIS — M6281 Muscle weakness (generalized): Secondary | ICD-10-CM | POA: Diagnosis not present

## 2020-08-04 DIAGNOSIS — R262 Difficulty in walking, not elsewhere classified: Secondary | ICD-10-CM | POA: Diagnosis not present

## 2020-08-04 DIAGNOSIS — R2681 Unsteadiness on feet: Secondary | ICD-10-CM | POA: Diagnosis not present

## 2020-08-04 DIAGNOSIS — R278 Other lack of coordination: Secondary | ICD-10-CM | POA: Diagnosis not present

## 2020-08-06 DIAGNOSIS — R2681 Unsteadiness on feet: Secondary | ICD-10-CM | POA: Diagnosis not present

## 2020-08-06 DIAGNOSIS — M6281 Muscle weakness (generalized): Secondary | ICD-10-CM | POA: Diagnosis not present

## 2020-08-06 DIAGNOSIS — R278 Other lack of coordination: Secondary | ICD-10-CM | POA: Diagnosis not present

## 2020-08-06 DIAGNOSIS — R262 Difficulty in walking, not elsewhere classified: Secondary | ICD-10-CM | POA: Diagnosis not present

## 2020-08-09 ENCOUNTER — Ambulatory Visit: Payer: Self-pay | Admitting: *Deleted

## 2020-08-10 DIAGNOSIS — R278 Other lack of coordination: Secondary | ICD-10-CM | POA: Diagnosis not present

## 2020-08-10 DIAGNOSIS — R262 Difficulty in walking, not elsewhere classified: Secondary | ICD-10-CM | POA: Diagnosis not present

## 2020-08-10 DIAGNOSIS — M6281 Muscle weakness (generalized): Secondary | ICD-10-CM | POA: Diagnosis not present

## 2020-08-10 DIAGNOSIS — R2681 Unsteadiness on feet: Secondary | ICD-10-CM | POA: Diagnosis not present

## 2020-08-11 ENCOUNTER — Telehealth: Payer: Self-pay | Admitting: Internal Medicine

## 2020-08-11 NOTE — Telephone Encounter (Signed)
Please advise 

## 2020-08-11 NOTE — Telephone Encounter (Signed)
Yes, please arrange, for 1 year.

## 2020-08-11 NOTE — Telephone Encounter (Signed)
Caller: [place]  Call Back # [phone]  Patient states that he is walking with a cane and would like a handicap place card for his vehicle. He would like to know if Dr. Larose Kells would like to fill out a form for a handicap sticker.

## 2020-08-12 DIAGNOSIS — R278 Other lack of coordination: Secondary | ICD-10-CM | POA: Diagnosis not present

## 2020-08-12 DIAGNOSIS — M6281 Muscle weakness (generalized): Secondary | ICD-10-CM | POA: Diagnosis not present

## 2020-08-12 DIAGNOSIS — R262 Difficulty in walking, not elsewhere classified: Secondary | ICD-10-CM | POA: Diagnosis not present

## 2020-08-12 DIAGNOSIS — R2681 Unsteadiness on feet: Secondary | ICD-10-CM | POA: Diagnosis not present

## 2020-08-12 NOTE — Telephone Encounter (Signed)
Informed Dr. Larose Kells can only do for 6 months or 5 years. Okay 5 years.

## 2020-08-12 NOTE — Telephone Encounter (Signed)
Spoke w/ Martin Chase- informed that form is ready for pick up at front desk. Copy of form sent for scanning.

## 2020-08-17 DIAGNOSIS — R262 Difficulty in walking, not elsewhere classified: Secondary | ICD-10-CM | POA: Diagnosis not present

## 2020-08-17 DIAGNOSIS — M6281 Muscle weakness (generalized): Secondary | ICD-10-CM | POA: Diagnosis not present

## 2020-08-17 DIAGNOSIS — R278 Other lack of coordination: Secondary | ICD-10-CM | POA: Diagnosis not present

## 2020-08-17 DIAGNOSIS — R2681 Unsteadiness on feet: Secondary | ICD-10-CM | POA: Diagnosis not present

## 2020-08-19 DIAGNOSIS — R262 Difficulty in walking, not elsewhere classified: Secondary | ICD-10-CM | POA: Diagnosis not present

## 2020-08-19 DIAGNOSIS — M6281 Muscle weakness (generalized): Secondary | ICD-10-CM | POA: Diagnosis not present

## 2020-08-19 DIAGNOSIS — R2681 Unsteadiness on feet: Secondary | ICD-10-CM | POA: Diagnosis not present

## 2020-08-19 DIAGNOSIS — R278 Other lack of coordination: Secondary | ICD-10-CM | POA: Diagnosis not present

## 2020-08-23 DIAGNOSIS — G4733 Obstructive sleep apnea (adult) (pediatric): Secondary | ICD-10-CM | POA: Diagnosis not present

## 2020-08-24 DIAGNOSIS — R262 Difficulty in walking, not elsewhere classified: Secondary | ICD-10-CM | POA: Diagnosis not present

## 2020-08-24 DIAGNOSIS — R2681 Unsteadiness on feet: Secondary | ICD-10-CM | POA: Diagnosis not present

## 2020-08-24 DIAGNOSIS — R278 Other lack of coordination: Secondary | ICD-10-CM | POA: Diagnosis not present

## 2020-08-24 DIAGNOSIS — M6281 Muscle weakness (generalized): Secondary | ICD-10-CM | POA: Diagnosis not present

## 2020-08-26 DIAGNOSIS — R278 Other lack of coordination: Secondary | ICD-10-CM | POA: Diagnosis not present

## 2020-08-26 DIAGNOSIS — M6281 Muscle weakness (generalized): Secondary | ICD-10-CM | POA: Diagnosis not present

## 2020-08-26 DIAGNOSIS — R2681 Unsteadiness on feet: Secondary | ICD-10-CM | POA: Diagnosis not present

## 2020-08-26 DIAGNOSIS — R262 Difficulty in walking, not elsewhere classified: Secondary | ICD-10-CM | POA: Diagnosis not present

## 2020-09-06 IMAGING — DX DG LUMBAR SPINE COMPLETE 4+V
5 series · 5 of 5 positions shown · non-contrast
Comparison: No prior.

CLINICAL DATA: Back pain.

EXAM:
LUMBAR SPINE - COMPLETE 4+ VIEW

[l-spine ap]
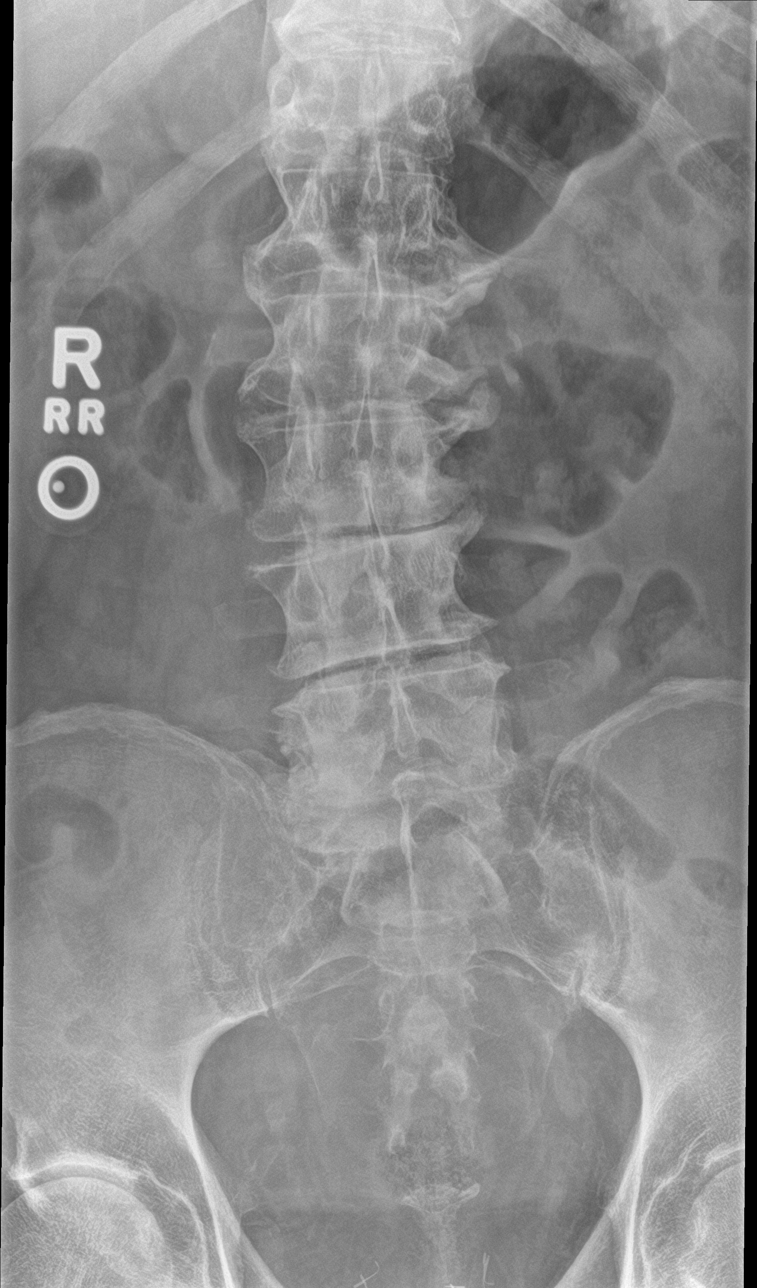

[l-spine obl (1 of 2)]
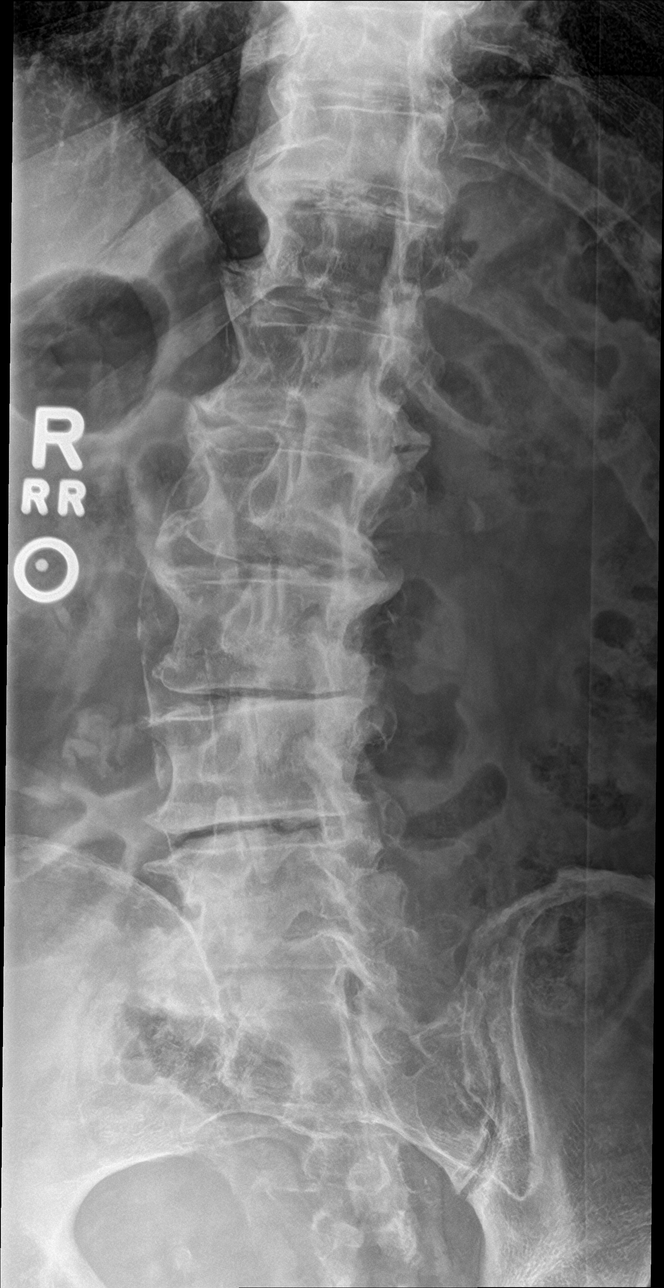

[l-spine obl (2 of 2)]
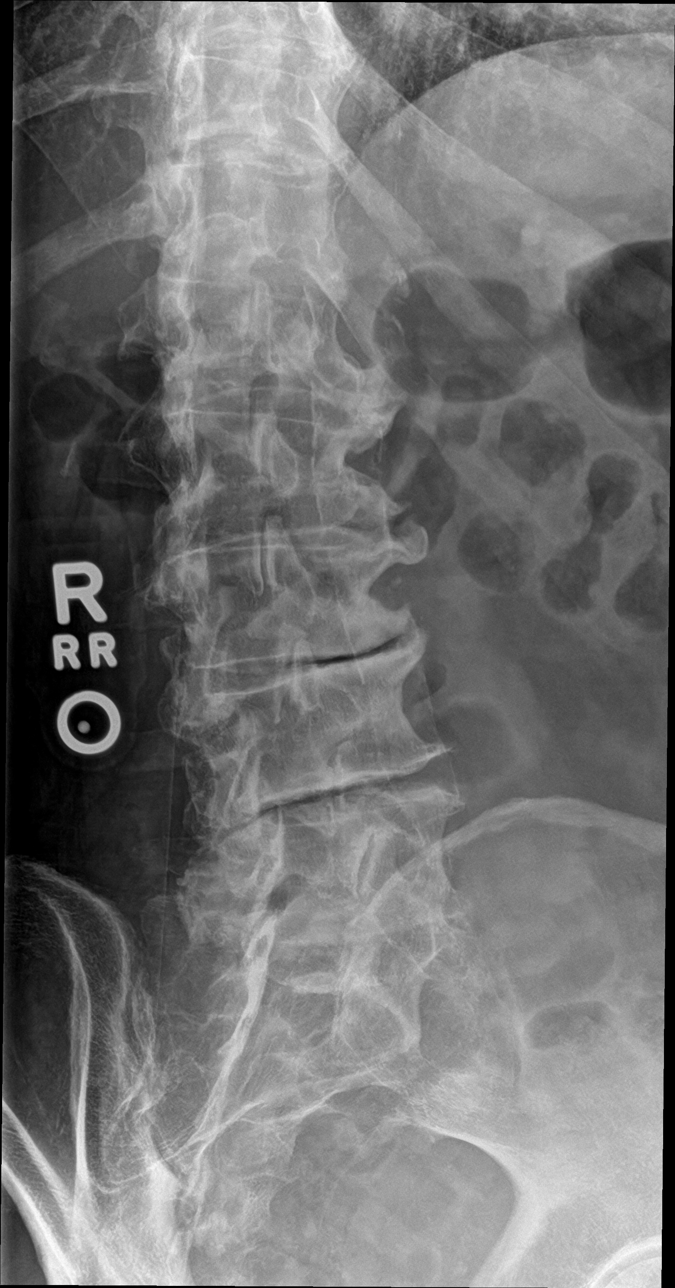

[l-spine lat]
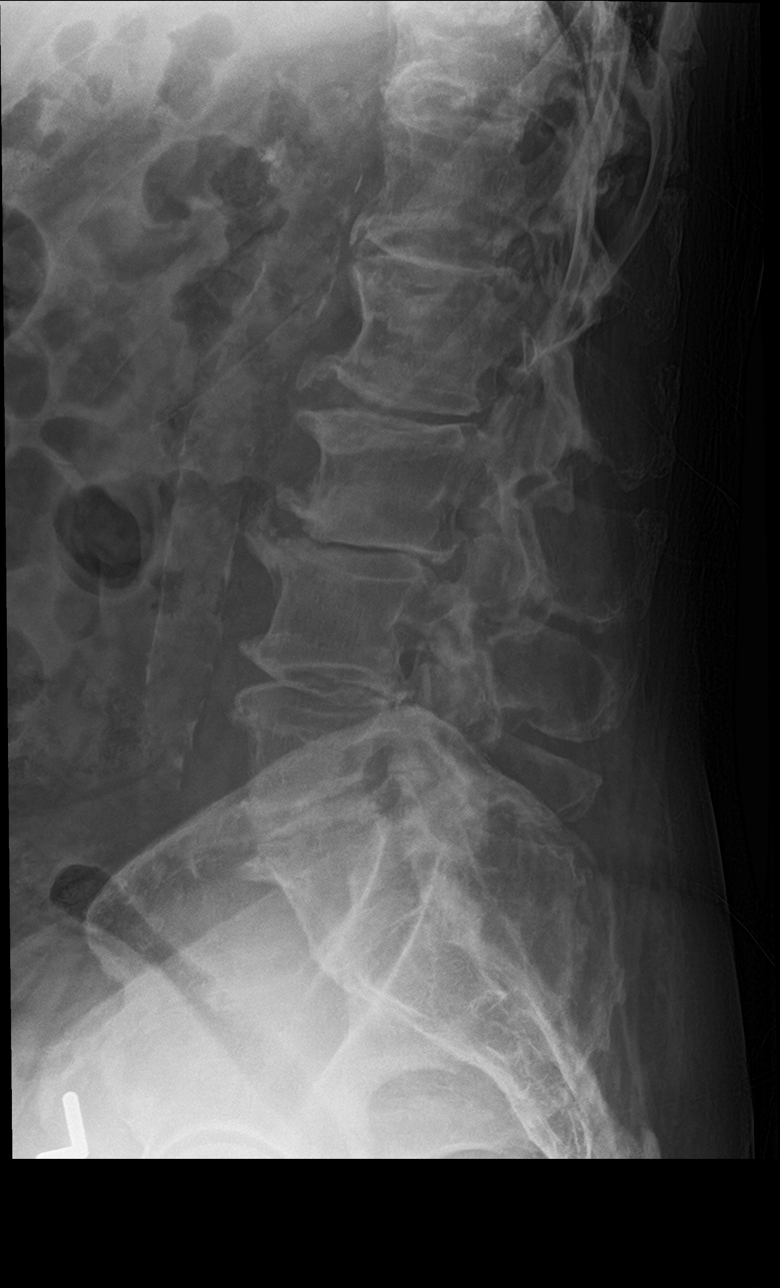

[l-spine spot]
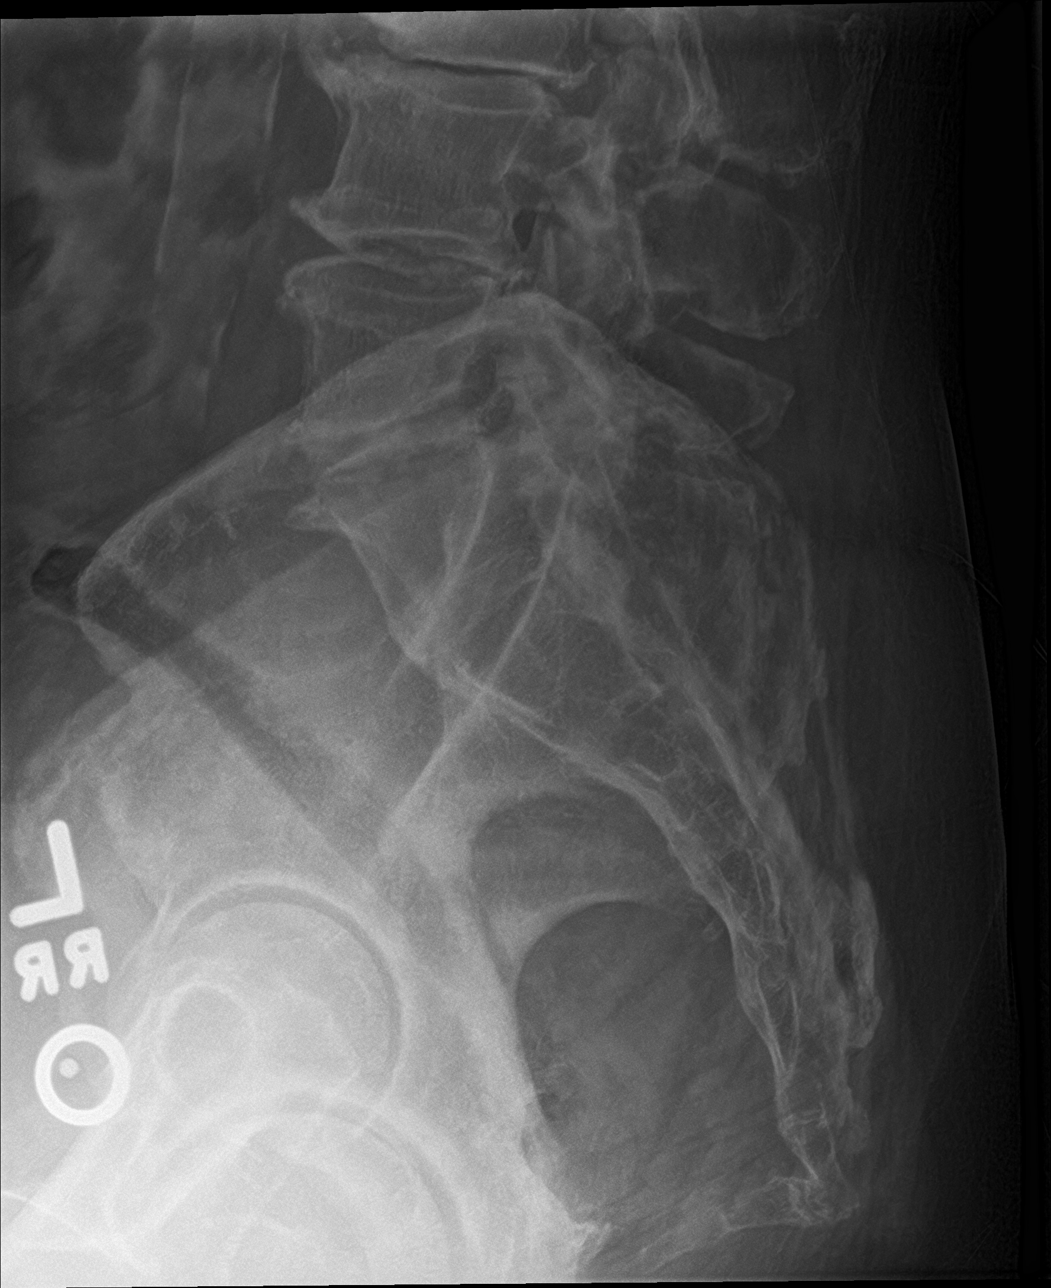

[5 of 5 positions shown; findings below may reference images not displayed]

FINDINGS: Lumbar spine numbered with the lowest completely segmented lumbar
shaped vertebrae on lateral view as L5. Lumbar spine scoliosis
concave left. Diffuse severe multilevel degenerative change with
multilevel disc degeneration and endplate osteophyte formation. 5 mm
retrolisthesis L3 on L4. No acute bony abnormality. Aortoiliac
atherosclerotic vascular calcification.
IMPRESSION: 1. Lumbar spine scoliosis concave left. Diffuse severe multilevel
degenerative change. 5 mm retrolisthesis L3 on L4. No acute bony
abnormality.

2.  Aortoiliac atherosclerotic vascular disease.

## 2020-09-07 DIAGNOSIS — M6281 Muscle weakness (generalized): Secondary | ICD-10-CM | POA: Diagnosis not present

## 2020-09-07 DIAGNOSIS — R278 Other lack of coordination: Secondary | ICD-10-CM | POA: Diagnosis not present

## 2020-09-07 DIAGNOSIS — R262 Difficulty in walking, not elsewhere classified: Secondary | ICD-10-CM | POA: Diagnosis not present

## 2020-09-09 DIAGNOSIS — R278 Other lack of coordination: Secondary | ICD-10-CM | POA: Diagnosis not present

## 2020-09-09 DIAGNOSIS — M6281 Muscle weakness (generalized): Secondary | ICD-10-CM | POA: Diagnosis not present

## 2020-09-09 DIAGNOSIS — R262 Difficulty in walking, not elsewhere classified: Secondary | ICD-10-CM | POA: Diagnosis not present

## 2020-09-14 ENCOUNTER — Telehealth: Payer: Self-pay | Admitting: Internal Medicine

## 2020-09-14 DIAGNOSIS — M6281 Muscle weakness (generalized): Secondary | ICD-10-CM | POA: Diagnosis not present

## 2020-09-14 DIAGNOSIS — R262 Difficulty in walking, not elsewhere classified: Secondary | ICD-10-CM | POA: Diagnosis not present

## 2020-09-14 DIAGNOSIS — R278 Other lack of coordination: Secondary | ICD-10-CM | POA: Diagnosis not present

## 2020-09-14 NOTE — Telephone Encounter (Signed)
Left message for patient to call back and schedule Medicare Annual Wellness Visit (AWV) with Nurse Health Advisor. This can be scheduled IN OFFICE or TELEPHONE VISIT.   This should be a 40 minute visit  Last AWV 08/07/19

## 2020-09-16 DIAGNOSIS — R262 Difficulty in walking, not elsewhere classified: Secondary | ICD-10-CM | POA: Diagnosis not present

## 2020-09-16 DIAGNOSIS — R278 Other lack of coordination: Secondary | ICD-10-CM | POA: Diagnosis not present

## 2020-09-16 DIAGNOSIS — M6281 Muscle weakness (generalized): Secondary | ICD-10-CM | POA: Diagnosis not present

## 2020-09-20 DIAGNOSIS — R350 Frequency of micturition: Secondary | ICD-10-CM | POA: Diagnosis not present

## 2020-09-21 ENCOUNTER — Telehealth: Payer: Self-pay | Admitting: Neurology

## 2020-09-21 ENCOUNTER — Encounter: Payer: Self-pay | Admitting: Neurology

## 2020-09-21 ENCOUNTER — Ambulatory Visit: Payer: Medicare Other | Admitting: Neurology

## 2020-09-21 ENCOUNTER — Other Ambulatory Visit: Payer: Self-pay

## 2020-09-21 VITALS — BP 122/78 | HR 56 | Ht 71.0 in | Wt 211.5 lb

## 2020-09-21 DIAGNOSIS — R413 Other amnesia: Secondary | ICD-10-CM | POA: Insufficient documentation

## 2020-09-21 DIAGNOSIS — G629 Polyneuropathy, unspecified: Secondary | ICD-10-CM | POA: Insufficient documentation

## 2020-09-21 DIAGNOSIS — R799 Abnormal finding of blood chemistry, unspecified: Secondary | ICD-10-CM | POA: Diagnosis not present

## 2020-09-21 DIAGNOSIS — R200 Anesthesia of skin: Secondary | ICD-10-CM | POA: Diagnosis not present

## 2020-09-21 DIAGNOSIS — R269 Unspecified abnormalities of gait and mobility: Secondary | ICD-10-CM

## 2020-09-21 DIAGNOSIS — D519 Vitamin B12 deficiency anemia, unspecified: Secondary | ICD-10-CM | POA: Diagnosis not present

## 2020-09-21 NOTE — Telephone Encounter (Signed)
[employer] medicare no auth. I sent a message for [facility] to reach out to patient to schedule.

## 2020-09-21 NOTE — Progress Notes (Signed)
[facility]  PATIENT: Martin Chase DOB: [birth year]  REFERRING DOCTOR OR PCP: Belinda Fisher SOURCE: Patient, notes from PCP, imaging and lab reports, MRI images personally reviewed.  _________________________________   HISTORICAL  CHIEF COMPLAINT:  Chief Complaint  Patient presents with  . New Patient (Initial Visit)    RM 59 with wife. Internal referral from [name], MD for syncope, r/o seizure.  . Gait Problem    Ambulates with cane. Most recent fall end of October. Stepped off curb and fell. Denies hitting head or significant injruies. Denies weakness. Feels more unsteady while walking. Going through PT twice a week with [facility].     HISTORY OF PRESENT ILLNESS:  I had the pleasure of seeing your patient, Martin Chase, at [facility] for neurologic consultation regarding his gait disturbance and episode of unresponsiveness.. Additionally, he has memory loss.  He is a 74 year old man who has had progressive gait disturbance and several falls.   His uses a cane for the past month and he feels more stable.  He had a fall at the end of October when he stepped off a curb and fell.  He did not have any significant injuries.   He feels his legs have mildly weakened with age but not significantly so.  He denies numbness.  He has some urinary hesitancy and has been told his bladder was enlarged on U/S.  He does not have urinary urgency.    He is doing physical therapy twice a week with [facility].  In September he was sitting on a barstool with a glazed look in his eyes and he did not respond to his wife.  He recalls the event and recalls falling to the floor.  He does not recall being lightheaded.   911 was called and the paramedics helped him to the sofa.   His vital signs were fine and he was quickly back to baseline.   Due to Covid-19 they opted not to go to the ED.    His wife did not note any shaking, tongue biting or incontinence.   He did have sweating.    A while back he had another slide off the barstool but was not unresponsive first (just felt off balanced as he slid off).    His wife has also noted that he has had some cognitive difficulties over the last year that have progressed.  She notes reduced short-term memory.  Imaging studies personally reviewed: MRI of the brain 11/08/2019 showed mild generalized cortical atrophy and minimal chronic microvascular ischemic changes in the hemispheres and pons.  Mild ethmoid chronic sinusitis.  There were no acute findings.  Montreal Cognitive Assessment  09/21/2020  Visuospatial/ Executive (0/5) 1  Naming (0/3) 3  Attention: Read list of digits (0/2) 2  Attention: Read list of letters (0/1) 0  Attention: Serial 7 subtraction starting at 100 (0/3) 3  Language: Repeat phrase (0/2) 2  Language : Fluency (0/1) 0  Abstraction (0/2) 1  Delayed Recall (0/5) 4  Orientation (0/6) 1  Total 17  Adjusted Score (based on education) 17     REVIEW OF SYSTEMS: Constitutional: No fevers, chills, sweats, or change in appetite Eyes: No visual changes, double vision, eye pain Ear, nose and throat: No hearing loss, ear pain, nasal congestion, sore throat Cardiovascular: No chest pain, palpitations Respiratory: No shortness of breath at rest or with exertion.   No wheezes GastrointestinaI: No nausea, vomiting, diarrhea, abdominal pain, fecal incontinence  Genitourinary: No dysuria, urinary retention or frequency.  No nocturia. Musculoskeletal: No neck pain, back pain Integumentary: No rash, pruritus, skin lesions Neurological: as above Psychiatric: No depression at this time.  No anxiety Endocrine: No palpitations, diaphoresis, change in appetite, change in weigh or increased thirst Hematologic/Lymphatic: No anemia, purpura, petechiae. Allergic/Immunologic: No itchy/runny eyes, nasal congestion, recent allergic reactions, rashes  ALLERGIES: No Known Allergies  HOME  MEDICATIONS:  Current Outpatient Medications:  .  Calcium Carbonate-Vit D-Min (CALCIUM 1200 PO), Take by mouth., Disp: , Rfl:  .  Cholecalciferol (VITAMIN D3) 2000 UNITS TABS, Take 2,000 Units by mouth daily. , Disp: , Rfl:  .  Cyanocobalamin (VITAMIN B-12) 5000 MCG TBDP, Take 5,000 mcg by mouth daily., Disp: , Rfl:  .  donepezil (ARICEPT) 10 MG tablet, Take 1 tablet (10 mg total) by mouth at bedtime., Disp: 90 tablet, Rfl: 3 .  losartan (COZAAR) 50 MG tablet, TAKE 1 TABLET BY MOUTH  DAILY, Disp: 90 tablet, Rfl: 3 .  Multiple Vitamin (MULTIVITAMIN WITH MINERALS) TABS tablet, Take 1 tablet by mouth daily. Centrum, Disp: , Rfl:  .  Probiotic Product (PROBIOTIC PO), Take 1 Dose by mouth daily., Disp: , Rfl:  .  tamsulosin (FLOMAX) 0.4 MG CAPS capsule, Take 0.4 mg by mouth daily after supper., Disp: , Rfl:  .  vitamin E 400 UNIT capsule, Take 800 Units by mouth daily., Disp: , Rfl:  .  atorvastatin (LIPITOR) 20 MG tablet, TAKE 1 TABLET BY MOUTH  DAILY (Patient not taking: Reported on 09/21/2020), Disp: 90 tablet, Rfl: 3 .  Sodium Sulfate-Mag Sulfate-KCl (SUTAB) [id number] MG TABS, Take 1 kit by mouth as directed. BIN: [id number] PCN: CN GROUP: [id number] MEMBER ID: [id number];JFH AS CASH;NO PRIOR AUTHORIZATION (Patient not taking: Reported on 09/21/2020), Disp: 24 tablet, Rfl: 0  PAST MEDICAL HISTORY: Past Medical History:  Diagnosis Date  . Allergy   . Anemia 1996   due to a bleeding from aspirin  . Arthritis   . Cataract   . History of blood transfusion 1996  . HOH (hard of hearing)    uses hearing aids/ hearing on left side worse  . Hyperlipidemia   . Hypertension   . Prediabetes 10/20/2014  . Prostate cancer ([place])   . Sleep apnea    uses c-pap    PAST SURGICAL HISTORY: Past Surgical History:  Procedure Laterality Date  . COLONOSCOPY  07/30/2019  . COLONOSCOPY WITH PROPOFOL N/A 09/16/2018   Procedure: COLONOSCOPY WITH PROPOFOL;  Surgeon: Yetta Flock, MD;  Location: [facility];  Service: Gastroenterology;  Laterality: N/A;  . POLYPECTOMY  09/16/2018   Procedure: POLYPECTOMY;  Surgeon: Yetta Flock, MD;  Location: [facility];  Service: Gastroenterology;;  . POLYPECTOMY    . SUBMUCOSAL INJECTION  09/16/2018   Procedure: SUBMUCOSAL INJECTION;  Surgeon: Yetta Flock, MD;  [facility]: [facility];  Service: Gastroenterology;;  . TONSILLECTOMY    . VASECTOMY      FAMILY HISTORY: Family History  Problem Relation Age of Onset  . Diabetes Mother   . Alzheimer's disease Mother   . Prostate cancer Father 88       prostatectomy?  . Diabetes Maternal Uncle   . Stroke Paternal Uncle   . Diabetes Maternal Grandmother   . Heart disease Maternal Grandfather   . Suicidality Brother   . Colon cancer Neg Hx   . Rectal cancer Neg Hx   . Esophageal cancer Neg Hx   . Stomach cancer Neg Hx   . Colon polyps  Neg Hx     SOCIAL HISTORY:  Social History   Socioeconomic History  . Marital status: Married    Spouse name: Patsy  . Number of children: 0  . Years of education: college  . Highest education level: Not on file  Occupational History  . Occupation: retired, [employer]  Tobacco Use  . Smoking status: Former Smoker    Packs/day: 1.00    Years: 5.00    Pack years: 5.00    Types: Cigarettes    Quit date: 01/29/1986    Years since quitting: 34.6  . Smokeless tobacco: Never Used  Vaping Use  . Vaping Use: Never used  Substance and Sexual Activity  . Alcohol use: Yes    Alcohol/week: 7.0 standard drinks    Types: 7 Glasses of wine per week    Comment: 1 glass of wine with dinner  . Drug use: No  . Sexual activity: Not Currently  Other Topics Concern  . Not on file  Social History Narrative   Lives with wife   Household -- pt and wife   Caffeine use: 2 cups coffee per day   Right handed    Social Determinants of Health   Financial Resource Strain:   . Difficulty of Paying Living Expenses: Not on file  Food  Insecurity:   . Worried About Charity fundraiser in the Last Year: Not on file  . Ran Out of Food in the Last Year: Not on file  Transportation Needs:   . Lack of Transportation (Medical): Not on file  . Lack of Transportation (Non-Medical): Not on file  Physical Activity:   . Days of Exercise per Week: Not on file  . Minutes of Exercise per Session: Not on file  Stress:   . Feeling of Stress : Not on file  Social Connections:   . Frequency of Communication with Friends and Family: Not on file  . Frequency of Social Gatherings with Friends and Family: Not on file  . Attends Religious Services: Not on file  . Active Member of Clubs or Organizations: Not on file  . Attends Archivist Meetings: Not on file  . Marital Status: Not on file  Intimate Partner Violence:   . Fear of Current or Ex-Partner: Not on file  . Emotionally Abused: Not on file  . Physically Abused: Not on file  . Sexually Abused: Not on file     PHYSICAL EXAM  Vitals:   09/21/20 1026  BP: 122/78  Pulse: (!) 56  SpO2: 98%  Weight: 211 lb 8 oz (95.9 kg)  Height: '5\' 11"'  (1.803 m)    Body mass index is 29.5 kg/m.   General: The patient is well-developed and well-nourished and in no acute distress  HEENT:  Head is [place]/AT.  Sclera are anicteric.  Neck: No carotid bruits are noted.  The neck is nontender.  Cardiovascular: The heart has a regular rate and rhythm with a normal S1 and S2. There were no murmurs, gallops or rubs.    Skin: Extremities are without rash or  edema.  Musculoskeletal:  Back is nontender  Neurologic Exam  Mental status: The patient is alert and oriented x 2 1/2 (stated 09/26/20 not 09/21/20) at the time of the examination. The patient has reduced short-term memory.  Montreal cognitive assessment score was 17/30 consistent with mild to moderate dementia.,   Clock drawing shows difficulty with number placement.  Difficulty with pattern continuation.   Executive function was  reduced.  Speech is normal.  Cranial nerves: Extraocular movements are full. Pupils are equal, round, and reactive to light and accomodation.  Visual fields are full.  Facial symmetry is present. There is good facial sensation to soft touch bilaterally.Facial strength is normal.  Trapezius and sternocleidomastoid strength is normal. No dysarthria is noted.  No obvious hearing deficits are noted.  Motor:  Muscle bulk is normal.   Tone is normal. Strength is  5 / 5 in all 4 extremities except 4/5 EHL and 4+/5 ankle extensors.    Sensory: Sensory testing is intact to pinprick, soft touch and vibration sensation in the arms but reduced pinprick at ankles and reduced PP in toes.   Reduced vibration at ankles and toes, normal at knees..  Coordination: Cerebellar testing reveals good finger-nose-finger and heel-to-shin bilaterally.  Gait and station: Station is normal.   Gait has mildly reduced stride and is mildly wide.  He turns 180 degrees in 3-4 steps.   Has only mild retropulsion.   Poor tandem . Romberg is negative.   Reflexes: Deep tendon reflexes are symmetric and normal in arms, 3 at the knees and absent at ankles.   Plantar responses are flexor.    DIAGNOSTIC DATA (LABS, IMAGING, TESTING) - I reviewed patient records, labs, notes, testing and imaging myself where available.  Lab Results  Component Value Date   WBC 6.6 11/03/2019   HGB 12.9 (L) 11/03/2019   HCT 39.0 11/03/2019   MCV 93.9 11/03/2019   PLT 241.0 11/03/2019      Component Value Date/Time   NA 141 04/01/2020 1047   NA 139 01/02/2018 0000   K 4.9 04/01/2020 1047   CL 102 04/01/2020 1047   CO2 34 (H) 04/01/2020 1047   GLUCOSE 99 04/01/2020 1047   BUN 17 04/01/2020 1047   BUN 21 01/02/2018 0000   CREATININE 0.90 04/01/2020 1047   CALCIUM 9.7 04/01/2020 1047   PROT 6.9 11/03/2019 1044   ALBUMIN 4.2 11/03/2019 1044   AST 29 11/03/2019 1044   ALT 21 11/03/2019 1044   ALKPHOS 71 11/03/2019 1044   BILITOT 0.6  11/03/2019 1044   GFRNONAA 110.80 07/25/2010 0933   GFRAA 110 06/26/2008 0948   Lab Results  Component Value Date   CHOL 164 11/03/2019   HDL 64 11/03/2019   LDLCALC 79 11/03/2019   TRIG 118 11/03/2019   CHOLHDL 2 05/01/2019   Lab Results  Component Value Date   HGBA1C 6.2 11/03/2019   Lab Results  Component Value Date   VITAMINB12 1,283 (H) 11/03/2019   Lab Results  Component Value Date   TSH 3.52 11/03/2019       ASSESSMENT AND PLAN  Gait disturbance - Plan: MR CERVICAL SPINE WO CONTRAST  Polyneuropathy - Plan: NCV with EMG(electromyography), Multiple Myeloma Panel (SPEP&IFE w/QIG), Sjogren's syndrome antibods(ssa + ssb), Vitamin B12, Hemoglobin A1c  Memory loss - Plan: Vitamin B12  Numbness - Plan: NCV with EMG(electromyography), MR CERVICAL SPINE WO CONTRAST   In summary, Mr. Kenley is a 74 year old man who has had progressive gait difficulties over the last year and has had several falls.   On exam, besides the reduced gait, he has numbness in the feet and it is probable that the gait disturbance is multifactorial involving mild polyneuropathy combined with age-related balance issues.  I will check blood work to rule out MGUS and Sjogren's disease and B12 deficiency.  We will further evaluate with the nerve conduction and EMG study.  Additionally we need to  check an MRI of the cervical spine to ensure that he does not have an intrinsic or extrinsic myelopathy that could be contributing to his gait disturbance.  The episode of reduced responsiveness may have been an episode of syncope.  It is unlikely to have been seizures based on the symptoms and his rapid return to baseline.  We will redo the NCV/EMG, we will also check autonomic function.  He also has cognitive dysfunction.  Besides reduced short-term memory, he has executive function and visual-spatial cognitive deficits.  He did not have too much atrophy on the MRI of the brain and I think Alzheimer's would be less  likely than another degenerative process such as Lewy body disease.  We will follow this.  Some of the blood work for neuropathy is also useful to evaluate memory loss.  They will return to see me for the NCV/EMG study.  Further follow-up will be arranged based on the results of the studies.  They should call sooner if new or worsening neurologic symptoms.  Thank you for asking me to see Mr. Staiger.  Please let me know if I can be of further assistance with him or other patients in the future.     Kao Conry A. [name], MD, [facility] 13/64/3837, 79:39 AM Certified in Neurology, Clinical Neurophysiology, Sleep Medicine and Neuroimaging  [facility] [address], [place] [employer], [place] [address] [phone]

## 2020-09-22 NOTE — Telephone Encounter (Signed)
Patient is scheduled at [facility] for 09/25/20.

## 2020-09-23 DIAGNOSIS — G4733 Obstructive sleep apnea (adult) (pediatric): Secondary | ICD-10-CM | POA: Diagnosis not present

## 2020-09-23 LAB — MULTIPLE MYELOMA PANEL, SERUM
Albumin SerPl Elph-Mcnc: 4 g/dL (ref 2.9–4.4)
Albumin/Glob SerPl: 1.3 (ref 0.7–1.7)
Alpha 1: 0.2 g/dL (ref 0.0–0.4)
Alpha2 Glob SerPl Elph-Mcnc: 0.9 g/dL (ref 0.4–1.0)
B-Globulin SerPl Elph-Mcnc: 1.3 g/dL (ref 0.7–1.3)
Gamma Glob SerPl Elph-Mcnc: 0.8 g/dL (ref 0.4–1.8)
Globulin, Total: 3.2 g/dL (ref 2.2–3.9)
IgA/Immunoglobulin A, Serum: 460 mg/dL — ABNORMAL HIGH (ref 61–437)
IgG (Immunoglobin G), Serum: 935 mg/dL (ref 603–1613)
IgM (Immunoglobulin M), Srm: 33 mg/dL (ref 15–143)
Total Protein: 7.2 g/dL (ref 6.0–8.5)

## 2020-09-23 LAB — HEMOGLOBIN A1C
Est. average glucose Bld gHb Est-mCnc: 126 mg/dL
Hgb A1c MFr Bld: 6 % — ABNORMAL HIGH (ref 4.8–5.6)

## 2020-09-23 LAB — VITAMIN B12: Vitamin B-12: >2000 pg/mL — ABNORMAL HIGH (ref 232–1245)

## 2020-09-23 LAB — SJOGREN'S SYNDROME ANTIBODS(SSA + SSB)
ENA SSA (RO) Ab: <0.2 AI (ref 0.0–0.9)
ENA SSB (LA) Ab: <0.2 AI (ref 0.0–0.9)

## 2020-09-25 ENCOUNTER — Ambulatory Visit (HOSPITAL_BASED_OUTPATIENT_CLINIC_OR_DEPARTMENT_OTHER)
Admission: RE | Admit: 2020-09-25 | Discharge: 2020-09-25 | Disposition: A | Discharge status: Home / Self Care | Payer: Medicare Other | Source: Ambulatory Visit | Attending: Neurology | Admitting: Neurology

## 2020-09-25 ENCOUNTER — Other Ambulatory Visit: Payer: Self-pay

## 2020-09-25 DIAGNOSIS — M50223 Other cervical disc displacement at C6-C7 level: Secondary | ICD-10-CM | POA: Diagnosis not present

## 2020-09-25 DIAGNOSIS — M4802 Spinal stenosis, cervical region: Secondary | ICD-10-CM | POA: Diagnosis not present

## 2020-09-25 DIAGNOSIS — R269 Unspecified abnormalities of gait and mobility: Secondary | ICD-10-CM | POA: Diagnosis not present

## 2020-09-25 DIAGNOSIS — R200 Anesthesia of skin: Secondary | ICD-10-CM | POA: Insufficient documentation

## 2020-09-25 DIAGNOSIS — M5021 Other cervical disc displacement,  high cervical region: Secondary | ICD-10-CM | POA: Diagnosis not present

## 2020-09-25 DIAGNOSIS — R270 Ataxia, unspecified: Secondary | ICD-10-CM | POA: Diagnosis not present

## 2020-09-29 ENCOUNTER — Encounter: Payer: Self-pay | Admitting: Internal Medicine

## 2020-10-06 ENCOUNTER — Encounter: Payer: Self-pay | Admitting: Internal Medicine

## 2020-10-06 ENCOUNTER — Ambulatory Visit (INDEPENDENT_AMBULATORY_CARE_PROVIDER_SITE_OTHER): Payer: Medicare Other | Admitting: Internal Medicine

## 2020-10-06 ENCOUNTER — Other Ambulatory Visit: Payer: Self-pay

## 2020-10-06 VITALS — BP 143/73 | HR 65 | Temp 98.1°F | Ht 71.0 in | Wt 211.8 lb

## 2020-10-06 DIAGNOSIS — E785 Hyperlipidemia, unspecified: Secondary | ICD-10-CM

## 2020-10-06 DIAGNOSIS — F0390 Unspecified dementia without behavioral disturbance: Secondary | ICD-10-CM | POA: Diagnosis not present

## 2020-10-06 DIAGNOSIS — R7989 Other specified abnormal findings of blood chemistry: Secondary | ICD-10-CM

## 2020-10-06 DIAGNOSIS — R269 Unspecified abnormalities of gait and mobility: Secondary | ICD-10-CM

## 2020-10-06 DIAGNOSIS — Z0000 Encounter for general adult medical examination without abnormal findings: Secondary | ICD-10-CM

## 2020-10-06 DIAGNOSIS — I10 Essential (primary) hypertension: Secondary | ICD-10-CM

## 2020-10-06 DIAGNOSIS — R739 Hyperglycemia, unspecified: Secondary | ICD-10-CM

## 2020-10-06 DIAGNOSIS — F03A0 Unspecified dementia, mild, without behavioral disturbance, psychotic disturbance, mood disturbance, and anxiety: Secondary | ICD-10-CM

## 2020-10-06 LAB — T4, FREE: Free T4: 0.67 ng/dL (ref 0.60–1.60)

## 2020-10-06 LAB — LIPID PANEL
Cholesterol: 234 mg/dL — ABNORMAL HIGH (ref 0–200)
HDL: 58.5 mg/dL (ref >39.00)
LDL Cholesterol: 147 mg/dL — ABNORMAL HIGH (ref 0–99)
NonHDL: 175.26
Total CHOL/HDL Ratio: 4
Triglycerides: 140 mg/dL (ref 0.0–149.0)
VLDL: 28 mg/dL (ref 0.0–40.0)

## 2020-10-06 LAB — COMPREHENSIVE METABOLIC PANEL
ALT: 12 U/L (ref 0–53)
AST: 22 U/L (ref 0–37)
Albumin: 4.2 g/dL (ref 3.5–5.2)
Alkaline Phosphatase: 72 U/L (ref 39–117)
BUN: 21 mg/dL (ref 6–23)
CO2: 30 mEq/L (ref 19–32)
Calcium: 9.3 mg/dL (ref 8.4–10.5)
Chloride: 100 mEq/L (ref 96–112)
Creatinine, Ser: 0.84 mg/dL (ref 0.40–1.50)
GFR: 84.99 mL/min (ref >60.00)
Glucose, Bld: 84 mg/dL (ref 70–99)
Potassium: 4.4 mEq/L (ref 3.5–5.1)
Sodium: 139 mEq/L (ref 135–145)
Total Bilirubin: 0.6 mg/dL (ref 0.2–1.2)
Total Protein: 7 g/dL (ref 6.0–8.3)

## 2020-10-06 LAB — TSH: TSH: 4.58 u[IU]/mL — ABNORMAL HIGH (ref 0.35–4.50)

## 2020-10-06 NOTE — Patient Instructions (Addendum)
Check the  blood pressure regularly BP GOAL is between 110/65 and  135/85. If it is consistently higher or lower, let me know  Proceed with Tdap at your convenience  GO TO THE LAB : Get the blood work     [place], [place] back for a checkup in 6 months     Advance Directive  Advance directives are legal documents that let you make choices ahead of time about your health care and medical treatment in case you become unable to communicate for yourself. Advance directives are a way for you to make known your wishes to family, friends, and health care providers. This can let others know about your end-of-life care if you become unable to communicate. Discussing and writing advance directives should happen over time rather than all at once. Advance directives can be changed depending on your situation and what you want, even after you have signed the advance directives. There are different types of advance directives, such as:  Medical power of attorney.  Living will.  Do not resuscitate (DNR) or do not attempt resuscitation (DNAR) order. Health care proxy and medical power of attorney A health care proxy is also called a health care agent. This is a person who is appointed to make medical decisions for you in cases where you are unable to make the decisions yourself. Generally, people choose someone they know well and trust to represent their preferences. Make sure to ask this person for an agreement to act as your proxy. A proxy may have to exercise judgment in the event of a medical decision for which your wishes are not known. A medical power of attorney is a legal document that names your health care proxy. Depending on the laws in your state, after the document is written, it may also need to be:  Signed.  Notarized.  Dated.  Copied.  Witnessed.  Incorporated into your medical record. You may also want to appoint someone to manage  your money in a situation in which you are unable to do so. This is called a durable power of attorney for finances. It is a separate legal document from the durable power of attorney for health care. You may choose the same person or someone different from your health care proxy to act as your agent in money matters. If you do not appoint a proxy, or if there is a concern that the proxy is not acting in your best interests, a court may appoint a guardian to act on your behalf. Living will A living will is a set of instructions that state your wishes about medical care when you cannot express them yourself. Health care providers should keep a copy of your living will in your medical record. You may want to give a copy to family members or friends. To alert caregivers in case of an emergency, you can place a card in your wallet to let them know that you have a living will and where they can find it. A living will is used if you become:  Terminally ill.  Disabled.  Unable to communicate or make decisions. Items to consider in your living will include:  To use or not to use life-support equipment, such as dialysis machines and breathing machines (ventilators).  A DNR or DNAR order. This tells health care providers not to use cardiopulmonary resuscitation (CPR) if breathing or heartbeat stops.  To use or not to use tube feeding.  To  be given or not to be given food and fluids.  Comfort (palliative) care when the goal becomes comfort rather than a cure.  Donation of organs and tissues. A living will does not give instructions for distributing your money and property if you should pass away. DNR or DNAR A DNR or DNAR order is a request not to have CPR in the event that your heart stops beating or you stop breathing. If a DNR or DNAR order has not been made and shared, a health care provider will try to help any patient whose heart has stopped or who has stopped breathing. If you plan to have  surgery, talk with your health care provider about how your DNR or DNAR order will be followed if problems occur. What if I do not have an advance directive? If you do not have an advance directive, some states assign family decision makers to act on your behalf based on how closely you are related to them. Each state has its own laws about advance directives. You may want to check with your health care provider, attorney, or state representative about the laws in your state. Summary  Advance directives are the legal documents that allow you to make choices ahead of time about your health care and medical treatment in case you become unable to tell others about your care.  The process of discussing and writing advance directives should happen over time. You can change the advance directives, even after you have signed them.  Advance directives include DNR or DNAR orders, living wills, and designating an agent as your medical power of attorney. This information is not intended to replace advice given to you by your health care provider. Make sure you discuss any questions you have with your health care provider. Document Revised: 05/22/2019 Document Reviewed: 05/22/2019 Elsevier Patient Education  [place] in the Home, Adult Falls can cause injuries and can affect people from all age groups. There are many simple things that you can do to make your home safe and to help prevent falls. Ask for help when making these changes, if needed. What actions can I take to prevent falls? General instructions  Use good lighting in all rooms. Replace any light bulbs that burn out.  Turn on lights if it is dark. Use night-lights.  Place frequently used items in easy-to-reach places. Lower the shelves around your home if necessary.  Set up furniture so that there are clear paths around it. Avoid moving your furniture around.  Remove throw rugs and other tripping hazards from the  floor.  Avoid walking on wet floors.  Fix any uneven floor surfaces.  Add color or contrast paint or tape to grab bars and handrails in your home. Place contrasting color strips on the first and last steps of stairways.  When you use a stepladder, make sure that it is completely opened and that the sides are firmly locked. Have someone hold the ladder while you are using it. Do not climb a closed stepladder.  Be aware of any and all pets. What can I do in the bathroom?      Keep the floor dry. Immediately clean up any water that spills onto the floor.  Remove soap buildup in the tub or shower on a regular basis.  Use non-skid mats or decals on the floor of the tub or shower.  Attach bath mats securely with double-sided, non-slip rug tape.  If you need to sit down while  you are in the shower, use a plastic, non-slip stool.  Install grab bars by the toilet and in the tub and shower. Do not use towel bars as grab bars. What can I do in the bedroom?  Make sure that a bedside light is easy to reach.  Do not use oversized bedding that drapes onto the floor.  Have a firm chair that has side arms to use for getting dressed. What can I do in the kitchen?  Clean up any spills right away.  If you need to reach for something above you, use a sturdy step stool that has a grab bar.  Keep electrical cables out of the way.  Do not use floor polish or wax that makes floors slippery. If you must use wax, make sure that it is non-skid floor wax. What can I do in the stairways?  Do not leave any items on the stairs.  Make sure that you have a light switch at the top of the stairs and the bottom of the stairs. Have them installed if you do not have them.  Make sure that there are handrails on both sides of the stairs. Fix handrails that are broken or loose. Make sure that handrails are as long as the stairways.  Install non-slip stair treads on all stairs in your home.  Avoid having  throw rugs at the top or bottom of stairways, or secure the rugs with carpet tape to prevent them from moving.  Choose a carpet design that does not hide the edge of steps on the stairway.  Check any carpeting to make sure that it is firmly attached to the stairs. Fix any carpet that is loose or worn. What can I do on the outside of my home?  Use bright outdoor lighting.  Regularly repair the edges of walkways and driveways and fix any cracks.  Remove high doorway thresholds.  Trim any shrubbery on the main path into your home.  Regularly check that handrails are securely fastened and in good repair. Both sides of any steps should have handrails.  Install guardrails along the edges of any raised decks or porches.  Clear walkways of debris and clutter, including tools and rocks.  Have leaves, snow, and ice cleared regularly.  Use sand or salt on walkways during winter months.  In the garage, clean up any spills right away, including grease or oil spills. What other actions can I take?  Wear closed-toe shoes that fit well and support your feet. Wear shoes that have rubber soles or low heels.  Use mobility aids as needed, such as canes, walkers, scooters, and crutches.  Review your medicines with your health care provider. Some medicines can cause dizziness or changes in blood pressure, which increase your risk of falling. Talk with your health care provider about other ways that you can decrease your risk of falls. This may include working with a physical therapist or trainer to improve your strength, balance, and endurance. Where to find more information  Centers for Disease Control and Prevention, [employer]: WebmailGuide.co.za  [employer] on Aging: BrainJudge.co.uk Contact a health care provider if:  You are afraid of falling at home.  You feel weak, drowsy, or dizzy at home.  You fall at home. Summary  There are many simple things that you can do to  make your home safe and to help prevent falls.  Ways to make your home safe include removing tripping hazards and installing grab bars in the bathroom.  Ask  for help when making these changes in your home. This information is not intended to replace advice given to you by your health care provider. Make sure you discuss any questions you have with your health care provider. Document Revised: 10/05/2017 Document Reviewed: 06/07/2017 Elsevier Patient Education  2020 [employer].

## 2020-10-06 NOTE — Progress Notes (Signed)
Subjective:    Patient ID: Martin Chase, male    DOB: 1946/09/23, 74 y.o.   MRN: [id number]  DOS:  10/06/2020 Type of visit - description: cpx Here with his wife. They both report that he is doing well except for a gait disturbance, recently saw neurology.  Note reviewed.  No behavioral issues, memory is decreased but at baseline. Not taking Lipitor, [place] doctor told him that it can go against his memory.  Review of Systems  Other than above, a 14 point review of systems is negative      Past Medical History:  Diagnosis Date  . Allergy   . Anemia 1996   due to a bleeding from aspirin  . Arthritis   . Cataract   . History of blood transfusion 1996  . HOH (hard of hearing)    uses hearing aids/ hearing on left side worse  . Hyperlipidemia   . Hypertension   . Prediabetes 10/20/2014  . Prostate cancer ([place])   . Sleep apnea    uses c-pap    Past Surgical History:  Procedure Laterality Date  . COLONOSCOPY  07/30/2019  . COLONOSCOPY WITH PROPOFOL N/A 09/16/2018   Procedure: COLONOSCOPY WITH PROPOFOL;  Surgeon: Yetta Flock, MD;  Location: [facility];  Service: Gastroenterology;  Laterality: N/A;  . POLYPECTOMY  09/16/2018   Procedure: POLYPECTOMY;  Surgeon: Yetta Flock, MD;  Location: [facility];  Service: Gastroenterology;;  . POLYPECTOMY    . SUBMUCOSAL INJECTION  09/16/2018   Procedure: SUBMUCOSAL INJECTION;  Surgeon: Yetta Flock, MD;  Location: [facility];  Service: Gastroenterology;;  . TONSILLECTOMY    . VASECTOMY      Allergies as of 10/06/2020   No Known Allergies     Medication List       Accurate as of October 06, 2020 11:59 PM. If you have any questions, ask your nurse or doctor.        STOP taking these medications   Sutab [id number] MG Tabs Generic drug: Sodium Sulfate-Mag Sulfate-KCl Stopped by: [name], MD   Vitamin D3 50 MCG (2000 UT) Tabs Stopped by: [name], MD     TAKE these medications    atorvastatin 20 MG tablet Commonly known as: LIPITOR TAKE 1 TABLET BY MOUTH  DAILY   CALCIUM 1200 PO Take by mouth.   donepezil 10 MG tablet Commonly known as: ARICEPT Take 1 tablet (10 mg total) by mouth at bedtime.   losartan 50 MG tablet Commonly known as: COZAAR TAKE 1 TABLET BY MOUTH  DAILY   multivitamin with minerals Tabs tablet Take 1 tablet by mouth daily. Centrum   PROBIOTIC PO Take 1 Dose by mouth daily.   tamsulosin 0.4 MG Caps capsule Commonly known as: FLOMAX Take 0.4 mg by mouth daily after supper.   Vitamin B-12 5000 MCG Tbdp Take 5,000 mcg by mouth daily.   vitamin E 180 MG (400 UNITS) capsule Take 800 Units by mouth daily.          Objective:   Physical Exam BP (!) 143/73 (BP Location: Right Arm, Patient Position: Sitting, Cuff Size: Large)   Pulse 65   Temp 98.1 F (36.7 C) (Oral)   Ht 5\' 11"  (1.803 m)   Wt 211 lb 12.8 oz (96.1 kg)   SpO2 98%   BMI 29.54 kg/m  General:   Well developed, NAD, BMI noted. Neck: No thyromegaly HEENT:  Normocephalic . Face symmetric, atraumatic Lungs:  CTA B Normal respiratory effort,  no intercostal retractions, no accessory muscle use. Heart: RRR,  no murmur.  Abdomen:  Not distended, soft, non-tender. No rebound or rigidity.   Skin: Not pale. Not jaundice Lower extremities: no pretibial edema bilaterally  Neurologic:  alert & cooperative, he has a dementia but today he is alert and oriented x3. Speech normal, gait appropriate for age, assisted by a cane  psych--   Behavior appropriate. No anxious or depressed appearing.     Assessment     Assessment Prediabetes HTN ([facility] ~ 01/[facility]) Hyperlipidemia DJD - chronic back pain Allergies PUD per pt, dx early 2000s, related to ASA, was told not to take Prostate cancer: DX 12-2018, UroLift, finished external radiation treatment ~ 07/2019 OSA, on CPAP Dementia: 92/1194: Normal RPR, folic acid and R74.  0-8144: Brain MRI normal for age.  No  acute.  PLAN Here for CPX Prediabetes: Recent A1c very good. HTN: Ambulatory BPs are in the 130s or less.  Continue losartan, checking CMP High cholesterol:  Pt d/c  Lipitor, [facility] told [facility] this could affect his memory.  They are holding Lipitor for about 3 months and noted no benefits,  they plan to go back on it. Dementia, gait disorder, near syncope: Saw neurology 09/21/2020 Evaluate for gait disturbances, likely multifactorial including polyneuropathy.  Labs were done, EMG recommended.  Also MRI of the cervical spine. Near syncope felt not to be seizure. Short-term memory noted to be reduced along with decrease executive function and visual-spatial-cognitive deficit. Labs were done He seems to be doing well on Aricept.  No behavioral issue. Slight increased TSH, check TFTs RTC 6 months   In addition to CPX, we addressed his chronic medical problems reviewed the chart and specifically the last neurology visit.  This visit occurred during the SARS-CoV-2 public health emergency.  Safety protocols were in place, including screening questions prior to the visit, additional usage of staff PPE, and extensive cleaning of exam room while observing appropriate contact time as indicated for disinfecting solutions.

## 2020-10-07 ENCOUNTER — Telehealth: Payer: Self-pay | Admitting: Internal Medicine

## 2020-10-07 NOTE — Progress Notes (Signed)
  Chronic Care Management   Outreach Note  10/07/2020 Name: Martin Chase MRN: [id number] DOB: 1946-10-03  Referred by: Colon Branch, MD Reason for referral : No chief complaint on file.   An unsuccessful telephone outreach was attempted today. The patient was referred to the pharmacist for assistance with care management and care coordination.   Follow Up Plan:   Carley Perdue [employer] Scheduler

## 2020-10-08 ENCOUNTER — Telehealth: Payer: Self-pay | Admitting: Internal Medicine

## 2020-10-08 ENCOUNTER — Encounter: Payer: Self-pay | Admitting: Internal Medicine

## 2020-10-08 NOTE — Progress Notes (Signed)
  Chronic Care Management   Outreach Note  10/08/2020 Name: Martin Chase MRN: [id number] DOB: 1945-12-13  Referred by: Colon Branch, MD Reason for referral : No chief complaint on file.   A second unsuccessful telephone outreach was attempted today. The patient was referred to pharmacist for assistance with care management and care coordination.  Follow Up Plan:   Carley Perdue [employer] Scheduler

## 2020-10-08 NOTE — Assessment & Plan Note (Signed)
Here for CPX Prediabetes: Recent A1c very good. HTN: Ambulatory BPs are in the 130s or less.  Continue losartan, checking CMP High cholesterol:  Pt d/c  Lipitor, [facility] told [facility] could affect his memory.  They are holding Lipitor for about 3 months and noted no benefits,  they plan to go back on it. Dementia, gait disorder, near syncope: Saw neurology 09/21/2020 Evaluate for gait disturbances, likely multifactorial including polyneuropathy.  Labs were done, EMG recommended.  Also MRI of the cervical spine. Near syncope felt not to be seizure. Short-term memory noted to be reduced along with decrease executive function and visual-spatial-cognitive deficit. Labs were done He seems to be doing well on Aricept.  No behavioral issue. Slight increased TSH, check TFTs RTC 6 months

## 2020-10-08 NOTE — Assessment & Plan Note (Signed)
-  Td 2010, recommend to proceed with a Tdap at the pharmacy -pnm 23: 2018; Prevnar 2015 - Zostavax 2012 -Shingrix: Per patient had 2 shots 09/04/20 and 10/05/20 -COVID vaccines x3 -Had a flu shot -- h/o  prostate cancer. --CCS: Colonoscopy 2009, Cscope 04/2019 --Diet and exercise discussed --Labs: CMP, FLP, TSH, T4

## 2020-10-12 ENCOUNTER — Telehealth: Payer: Self-pay | Admitting: Internal Medicine

## 2020-10-12 NOTE — Progress Notes (Signed)
  Chronic Care Management   Note  10/12/2020 Name: Martin Chase MRN: [id number] DOB: 1946-02-14  Martin Chase is a 74 y.o. year old male who is a primary care patient of Colon Branch, MD. I reached out to [name] by phone today in response to a referral sent by [name]'s PCP, Colon Branch, MD.   [name] was given information about Chronic Care Management services today including:  1. CCM service includes personalized support from designated clinical staff supervised by his physician, including individualized plan of care and coordination with other care providers 2. 24/7 contact phone numbers for assistance for urgent and routine care needs. 3. Service will only be billed when office clinical staff spend 20 minutes or more in a month to coordinate care. 4. Only one practitioner may furnish and bill the service in a calendar month. 5. The patient may stop CCM services at any time (effective at the end of the month) by phone call to the office staff.   Patient agreed to services and verbal consent obtained.   Follow up plan:   Carley Perdue [employer] Scheduler

## 2020-10-15 DIAGNOSIS — R262 Difficulty in walking, not elsewhere classified: Secondary | ICD-10-CM | POA: Diagnosis not present

## 2020-10-15 DIAGNOSIS — R278 Other lack of coordination: Secondary | ICD-10-CM | POA: Diagnosis not present

## 2020-10-15 DIAGNOSIS — M6281 Muscle weakness (generalized): Secondary | ICD-10-CM | POA: Diagnosis not present

## 2020-10-19 ENCOUNTER — Encounter: Payer: Medicare Other | Admitting: Neurology

## 2020-10-19 ENCOUNTER — Ambulatory Visit (INDEPENDENT_AMBULATORY_CARE_PROVIDER_SITE_OTHER): Payer: Medicare Other | Admitting: Neurology

## 2020-10-19 ENCOUNTER — Encounter: Payer: Self-pay | Admitting: Neurology

## 2020-10-19 ENCOUNTER — Telehealth: Payer: Self-pay

## 2020-10-19 ENCOUNTER — Telehealth: Payer: Self-pay | Admitting: Neurology

## 2020-10-19 DIAGNOSIS — R200 Anesthesia of skin: Secondary | ICD-10-CM | POA: Diagnosis not present

## 2020-10-19 DIAGNOSIS — M4807 Spinal stenosis, lumbosacral region: Secondary | ICD-10-CM | POA: Diagnosis not present

## 2020-10-19 DIAGNOSIS — G629 Polyneuropathy, unspecified: Secondary | ICD-10-CM | POA: Diagnosis not present

## 2020-10-19 DIAGNOSIS — Z0289 Encounter for other administrative examinations: Secondary | ICD-10-CM

## 2020-10-19 NOTE — Telephone Encounter (Signed)
Plan of care signed and emailed back to - [email or web address] ([place] location). Email confirmation received. Form sent for scanning.

## 2020-10-19 NOTE — Telephone Encounter (Signed)
[facility] [employer] no auth. They will reach out to the patient to schedule.

## 2020-10-19 NOTE — Progress Notes (Signed)
Full Name: Martin Chase Gender: Male MRN #: [id number] Date of Birth: [birth year]    Visit Date: 10/19/2020 09:46 Age: 74 Years Examining Physician: [name], MD  Referring Physician: [name], MD Patient History:  Foot temp: 35.0C Hand temp: 35.8C    History: Martin Chase is a 74 year old man with progressive gait disturbance and leg numbness and weakness.  He also has a mild dementia.  On examination, strength was 5/5 in the arms and proximal legs..  Strength was 4/5 in the toe extensors and 4+/5 in the ankle extensors.  Nerve conduction studies: In the right arm, the median motor response had a delayed distal latency, mildly reduced amplitude and reduced conduction velocity.  The radial sensory response had a delayed peak latency with greatly reduced amplitude.  The right median and ulnar sensory responses were absent.  The right ulnar F-wave latency was delayed.  In the right leg, the peroneal and tibial motor responses were absent.  The sural and superficial sensory responses were absent.  The galvanic sympathetic skin response was present in the right foot and the right palm.  Electromyography: Needle EMG of selected muscles of the right leg was performed.  There was mild chronic denervation in the vastus medialis, peroneus longus, iliopsoas and gluteus medius muscle, moderate chronic denervation in the gastrocnemius muscle and severe chronic elevation in the abductor hallucis and extensor digitorum brevis muscles.  None of the muscles had abnormal spontaneous activity.  Impression: This NCV/EMG study shows the following: 1.   Moderate length dependent demyelinating and axonal polyneuropathy affecting the sensory and motor fibers more than the autonomic fibers..  There were no active features. 2.   Superimposed L3-S1 chronic radiculopathies.  This could be due to spinal stenosis.  Martin Chase A. [name], MD, PhD, FAAN Certified in Neurology, Clinical Neurophysiology,  Sleep Medicine, Pain Medicine and Neuroimaging Director, [place] [facility] [address], [place], [place] [address] [phone]    Verbal informed consent was obtained from the patient, patient was informed of potential risk of procedure, including bruising, bleeding, hematoma formation, infection, muscle weakness, muscle pain, numbness, among others.         [place]    Nerve / Sites Muscle Latency Ref. Amplitude Ref. Rel Amp Segments Distance Velocity Ref. Area    ms ms mV mV %  cm m/s m/s mVms  R Median - APB     Wrist APB 6.6 ?4.4 3.4 ?4.0 100 Wrist - APB 7   14.5     Upper arm APB 13.0  3.0  90 Upper arm - Wrist 24 38 ?49 12.7  R Ulnar - ADM     Wrist ADM 2.3 ?3.3 1.1 ?6.0 100 Wrist - ADM 7   4.2     B.Elbow ADM 10.3  4.1  370 B.Elbow - Wrist 22 28 ?49 14.6     MartinElbow ADM 14.4  3.2  77.7 MartinElbow - B.Elbow 10 24 ?49 21.3         MartinElbow - Wrist      R Peroneal - EDB     Ankle EDB NR ?6.5 NR ?2.0 NR Ankle - EDB 9   NR     Fib head EDB NR  NR  NR Fib head - Ankle 29 NR ?44 NR     Pop fossa EDB NR  NR  NR Pop fossa - Fib head 10 NR ?44 NR  Pop fossa - Ankle      R Tibial - AH     Ankle AH NR ?5.8 NR ?4.0 NR Ankle - AH 9   NR     Pop fossa AH NR  NR  NR Pop fossa - Ankle 41 NR ?41 NR             SSR    Nerve / Sites Latency   s  R Sympathetic - Palm     Palm 1.66  R Sympathetic - Foot     Foot 2.56           SNC    Nerve / Sites Rec. Site Peak Lat Ref.  Amp Ref. Segments Distance    ms ms V V  cm  R Radial - Anatomical snuff box (Forearm)     Forearm Wrist 3.6 ?2.9 3 ?15 Forearm - Wrist 10  R Sural - Ankle (Calf)     Calf Ankle NR ?4.4 NR ?6 Calf - Ankle 14  R Superficial peroneal - Ankle     Lat leg Ankle NR ?4.4 NR ?6 Lat leg - Ankle 14  R Median - Orthodromic (Dig II, Mid palm)     Dig II Wrist NR ?3.4 NR ?10 Dig II - Wrist 13  R Ulnar - Orthodromic, (Dig V, Mid palm)      Dig V Wrist NR ?3.1 NR ?5 Dig V - Wrist 76               F  Wave    Nerve F Lat Ref.   ms ms  R Ulnar - ADM 44.3 ?32.0         EMG Summary Table    Spontaneous MUAP Recruitment  Muscle IA Fib PSW Fasc Other Amp Dur. Poly Pattern  R. Vastus medialis Normal None None None _______ Increased Increased 1+ Reduced  R, Ant. Tibialis Normal None None None _______ Increased Increased 1+ Reduced            R. Peron. Longus Normal None None None  Increased Increased 1+ Reduced  L. Gastroc. Normal None None None  Increased Increased 2+ Discrete  R. Add. Hallucis Normal None None None  Normal Increased 4+ Single  R iliopsoas Normal  None None None  Normal Increased 1+ Reduced  R. Glut. Medius Normal None None None _______ Increased Increased 2+ Reduced             [facility]  PATIENT: Martin Chase DOB: May 22, 1946  REFERRING DOCTOR OR PCP: Martin Chase SOURCE: Patient, notes from PCP, imaging and lab reports, MRI images personally reviewed.  _________________________________   HISTORICAL  CHIEF COMPLAINT:  Chief Complaint  Patient presents with  . Peripheral Neuropathy  . Memory Loss    HISTORY OF PRESENT ILLNESS:  Martin Chase is a 74 year old man with polyneuropathy and memory loss.  Update 10/19/2020: He has had progressive gait disturbance and several falls over the last several months.   He notes a little bit of numbness and mild weakness in the legs.   At times, he gets pain radiating from the back to the legs.  He has been walking more stooped over over the past year.  He notes that pain is less when he bends forward than when he stands up straight..  He has some urinary hesitancy and has been told his bladder was enlarged on U/S.  He does not have urinary urgency.      He has had  progressive cognitive difficulties over the last year.  His wife notes reduced short-term memory.  At the last visit, donepezil was started.  He tolerated  well.  Data: NCV/EMG 10/19/2020 shows 1.   Moderate length dependent demyelinating and axonal polyneuropathy affecting the sensory and motor fibers more than the autonomic fibers..  There were no active features. 2.   Superimposed L3-S1 chronic radiculopathies.  This could be due to spinal stenosis. Imaging studies personally reviewed:  MRI of the brain 11/08/2019 showed mild generalized cortical atrophy and minimal chronic microvascular ischemic changes in the hemispheres and pons.  Mild ethmoid chronic sinusitis.  There were no acute findings.  Labs: 09/21/2020: B12, SPEP/IEF, SSA/SSB and hemoglobin A1c were normal or noncontributory.  Montreal Cognitive Assessment  09/21/2020  Visuospatial/ Executive (0/5) 1  Naming (0/3) 3  Attention: Read list of digits (0/2) 2  Attention: Read list of letters (0/1) 0  Attention: Serial 7 subtraction starting at 100 (0/3) 3  Language: Repeat phrase (0/2) 2  Language : Fluency (0/1) 0  Abstraction (0/2) 1  Delayed Recall (0/5) 4  Orientation (0/6) 1  Total 17  Adjusted Score (based on education) 17     REVIEW OF SYSTEMS: Constitutional: No fevers, chills, sweats, or change in appetite Eyes: No visual changes, double vision, eye pain Ear, nose and throat: No hearing loss, ear pain, nasal congestion, sore throat Cardiovascular: No chest pain, palpitations Respiratory: No shortness of breath at rest or with exertion.   No wheezes GastrointestinaI: No nausea, vomiting, diarrhea, abdominal pain, fecal incontinence Genitourinary: No dysuria, urinary retention or frequency.  No nocturia. Musculoskeletal:No neck pain.  He reports back and leg pain. Integumentary: No rash, pruritus, skin lesions Neurological: as above Psychiatric: No depression at this time.  No anxiety Endocrine: No palpitations, diaphoresis, change in appetite, change in weigh or increased thirst Hematologic/Lymphatic: No anemia, purpura, petechiae. Allergic/Immunologic: No  itchy/runny eyes, nasal congestion, recent allergic reactions, rashes  ALLERGIES: No Known Allergies  HOME MEDICATIONS:  Current Outpatient Medications:  .  atorvastatin (LIPITOR) 20 MG tablet, TAKE 1 TABLET BY MOUTH  DAILY (Patient not taking: Reported on 09/21/2020), Disp: 90 tablet, Rfl: 3 .  Calcium Carbonate-Vit D-Min (CALCIUM 1200 PO), Take by mouth., Disp: , Rfl:  .  Cyanocobalamin (VITAMIN B-12) 5000 MCG TBDP, Take 5,000 mcg by mouth daily., Disp: , Rfl:  .  donepezil (ARICEPT) 10 MG tablet, Take 1 tablet (10 mg total) by mouth at bedtime., Disp: 90 tablet, Rfl: 3 .  losartan (COZAAR) 50 MG tablet, TAKE 1 TABLET BY MOUTH  DAILY, Disp: 90 tablet, Rfl: 3 .  Multiple Vitamin (MULTIVITAMIN WITH MINERALS) TABS tablet, Take 1 tablet by mouth daily. Centrum, Disp: , Rfl:  .  Probiotic Product (PROBIOTIC PO), Take 1 Dose by mouth daily., Disp: , Rfl:  .  tamsulosin (FLOMAX) 0.4 MG CAPS capsule, Take 0.4 mg by mouth daily after supper., Disp: , Rfl:  .  vitamin E 400 UNIT capsule, Take 800 Units by mouth daily., Disp: , Rfl:   PAST MEDICAL HISTORY: Past Medical History:  Diagnosis Date  . Allergy   . Anemia 1996   due to a bleeding from aspirin  . Arthritis   . Cataract   . History of blood transfusion 1996  . HOH (hard of hearing)    uses hearing aids/ hearing on left side worse  . Hyperlipidemia   . Hypertension   . Prediabetes 10/20/2014  . Prostate cancer ([place])   . Sleep apnea  uses c-pap    PAST SURGICAL HISTORY: Past Surgical History:  Procedure Laterality Date  . COLONOSCOPY  07/30/2019  . COLONOSCOPY WITH PROPOFOL N/A 09/16/2018   Procedure: COLONOSCOPY WITH PROPOFOL;  Surgeon: Yetta Flock, MD;  Location: [facility];  Service: Gastroenterology;  Laterality: N/A;  . POLYPECTOMY  09/16/2018   Procedure: POLYPECTOMY;  Surgeon: Yetta Flock, MD;  Location: [facility];  Service: Gastroenterology;;  . POLYPECTOMY    . SUBMUCOSAL INJECTION   09/16/2018   Procedure: SUBMUCOSAL INJECTION;  Surgeon: Yetta Flock, MD;  Location: [facility];  Service: Gastroenterology;;  . TONSILLECTOMY    . VASECTOMY      FAMILY HISTORY: Family History  Problem Relation Age of Onset  . Diabetes Mother   . Alzheimer's disease Mother   . Prostate cancer Father 64       prostatectomy?  . Diabetes Maternal Uncle   . Stroke Paternal Uncle   . Diabetes Maternal Grandmother   . Heart disease Maternal Grandfather   . Suicidality Brother   . Colon cancer Neg Hx   . Rectal cancer Neg Hx   . Esophageal cancer Neg Hx   . Stomach cancer Neg Hx   . Colon polyps Neg Hx     SOCIAL HISTORY:  Social History   Socioeconomic History  . Marital status: Married    Spouse name: Patsy  . Number of children: 0  . Years of education: college  . Highest education level: Not on file  Occupational History  . Occupation: retired, [employer]  Tobacco Use  . Smoking status: Former Smoker    Packs/day: 1.00    Years: 5.00    Pack years: 5.00    Types: Cigarettes    Quit date: 01/29/1986    Years since quitting: 34.7  . Smokeless tobacco: Never Used  Vaping Use  . Vaping Use: Never used  Substance and Sexual Activity  . Alcohol use: Yes    Alcohol/week: 7.0 standard drinks    Types: 7 Glasses of wine per week    Comment: 1 glass of wine with dinner  . Drug use: No  . Sexual activity: Not Currently  Other Topics Concern  . Not on file  Social History Narrative   Lives with wife   Household -- pt and wife   Caffeine use: 2 cups coffee per day   Right handed    Social Determinants of Health   Financial Resource Strain: Not on file  Food Insecurity: Not on file  Transportation Needs: Not on file  Physical Activity: Not on file  Stress: Not on file  Social Connections: Not on file  Intimate Partner Violence: Not on file     PHYSICAL EXAM  There were no vitals filed for this visit.  There is no height or weight  on file to calculate BMI.   General: The patient is well-developed and well-nourished and in no acute distress  HEENT:  Head is [place]/AT.  Sclera are anicteric.  Neck: No carotid bruits are noted.  The neck is nontender.  Cardiovascular: The heart has a regular rate and rhythm with a normal S1 and S2. There were no murmurs, gallops or rubs.    Skin: Extremities are without rash or  edema.  Musculoskeletal:  Back is nontender  Neurologic Exam  Mental status: The patient is alert and oriented x 2 1/2 (stated 09/26/20 not 09/21/20) at the time of the examination. The patient has reduced short-term memory.  Montreal cognitive assessment  score was 17/30 consistent with mild to moderate dementia.,   Clock drawing shows difficulty with number placement.  Difficulty with pattern continuation.   Executive function was reduced.  Speech is normal.  Cranial nerves: Extraocular movements are full. Pupils are equal, round, and reactive to light and accomodation.  Visual fields are full.  Facial symmetry is present. There is good facial sensation to soft touch bilaterally.Facial strength is normal.  Trapezius and sternocleidomastoid strength is normal. No dysarthria is noted.  No obvious hearing deficits are noted.  Motor:  Muscle bulk is normal.   Tone is normal. Strength is  5 / 5 in all 4 extremities except 4/5 EHL and 4+/5 ankle extensors.    Sensory: Sensory testing is intact to pinprick, soft touch and vibration sensation in the arms but reduced pinprick in toes and up to mid-foot   Reduced vibration at ankles (30% sensation) and toes (20% sensation), normal at knees..  Coordination: Cerebellar testing reveals good finger-nose-finger and heel-to-shin bilaterally.  Gait and station: Station is normal.   Gait has mildly reduced stride and is mildly wide.  He turns 180 degrees in 3-4 steps.   Has only mild retropulsion.   Poor tandem . Romberg is negative.   Reflexes: Deep tendon reflexes are symmetric  and normal in arms, 3 at the knees and absent at ankles.   Plantar responses are flexor.    DIAGNOSTIC DATA (LABS, IMAGING, TESTING) - I reviewed patient records, labs, notes, testing and imaging myself where available.  Lab Results  Component Value Date   WBC 6.6 11/03/2019   HGB 12.9 (L) 11/03/2019   HCT 39.0 11/03/2019   MCV 93.9 11/03/2019   PLT 241.0 11/03/2019      Component Value Date/Time   NA 139 10/06/2020 1347   NA 139 01/02/2018 0000   K 4.4 10/06/2020 1347   CL 100 10/06/2020 1347   CO2 30 10/06/2020 1347   GLUCOSE 84 10/06/2020 1347   BUN 21 10/06/2020 1347   BUN 21 01/02/2018 0000   CREATININE 0.84 10/06/2020 1347   CALCIUM 9.3 10/06/2020 1347   PROT 7.0 10/06/2020 1347   PROT 7.2 09/21/2020 1157   ALBUMIN 4.2 10/06/2020 1347   AST 22 10/06/2020 1347   ALT 12 10/06/2020 1347   ALKPHOS 72 10/06/2020 1347   BILITOT 0.6 10/06/2020 1347   GFRNONAA 110.80 07/25/2010 0933   GFRAA 110 06/26/2008 0948   Lab Results  Component Value Date   CHOL 234 (H) 10/06/2020   HDL 58.50 10/06/2020   LDLCALC 147 (H) 10/06/2020   TRIG 140.0 10/06/2020   CHOLHDL 4 10/06/2020   Lab Results  Component Value Date   HGBA1C 6.0 (H) 09/21/2020   Lab Results  Component Value Date   VITAMINB12 >2000 (H) 09/21/2020   Lab Results  Component Value Date   TSH 4.58 (H) 10/06/2020       ASSESSMENT AND PLAN  Polyneuropathy - Plan: NCV with EMG(electromyography)  Numbness - Plan: NCV with EMG(electromyography)  Spinal stenosis of lumbosacral region - Plan: MR LUMBAR SPINE WO CONTRAST  1.  He appears to have both a length dependent axonal and demyelinating polyneuropathy affecting sensory and motor fibers (but not affecting autonomic fibers significantly).  Lab work for treatable polyneuropathies has been negative. 2.   He also has superimposed multiple lumbosacral radiculopathies likely has spinal stenosis based on symptoms as well as EMG.  We will check an MRI of the  lumbar spine.  If he has severe spinal  stenosis, consider referral to surgery. 3.   Continue donepezil.   4.  Return in 3 months or sooner if there are new or worsening neurologic symptoms.  Lydie Stammen A. [name], MD, [facility] 46/[id number]/9935, 70:17 PM Certified in Neurology, Clinical Neurophysiology, Sleep Medicine and Neuroimaging  [facility] [address] [place], [place] [address] [phone]

## 2020-10-20 NOTE — Telephone Encounter (Signed)
Patient is scheduled at [facility] for 10/23/20.

## 2020-10-23 ENCOUNTER — Ambulatory Visit (HOSPITAL_BASED_OUTPATIENT_CLINIC_OR_DEPARTMENT_OTHER)
Admission: RE | Admit: 2020-10-23 | Discharge: 2020-10-23 | Disposition: A | Discharge status: Home / Self Care | Payer: Medicare Other | Source: Ambulatory Visit | Attending: Neurology | Admitting: Neurology

## 2020-10-23 ENCOUNTER — Other Ambulatory Visit: Payer: Self-pay

## 2020-10-23 DIAGNOSIS — M48061 Spinal stenosis, lumbar region without neurogenic claudication: Secondary | ICD-10-CM | POA: Diagnosis not present

## 2020-10-23 DIAGNOSIS — M4807 Spinal stenosis, lumbosacral region: Secondary | ICD-10-CM

## 2020-10-25 ENCOUNTER — Telehealth: Payer: Self-pay | Admitting: *Deleted

## 2020-10-25 DIAGNOSIS — R200 Anesthesia of skin: Secondary | ICD-10-CM

## 2020-10-25 DIAGNOSIS — M4807 Spinal stenosis, lumbosacral region: Secondary | ICD-10-CM

## 2020-10-25 DIAGNOSIS — R269 Unspecified abnormalities of gait and mobility: Secondary | ICD-10-CM

## 2020-10-25 NOTE — Telephone Encounter (Signed)
Called and left detailed message about results per Dr. [name] note (ok per [facility]). Advised we will send referral to [facility]. They should contact them in the next 1-2 weeks to get scheduled. Advised them to call our office if they do not hear about getting scheduled.

## 2020-10-25 NOTE — Telephone Encounter (Signed)
-----   Message from Britt Bottom, MD sent at 10/25/2020 12:54 PM EST ----- Please let him know that the MRI of the lumbar spine does show moderately severe spinal stenosis at 2 levels (L3-L4 and L4-L5) and there are pinched nerves at these levels.  That is likely playing a role in his gait difficulty.  I would like to refer him to neurosurgery for the spinal stenosis at these levels.

## 2020-11-01 ENCOUNTER — Encounter: Payer: Medicare Other | Admitting: Internal Medicine

## 2020-11-08 DIAGNOSIS — M48061 Spinal stenosis, lumbar region without neurogenic claudication: Secondary | ICD-10-CM | POA: Diagnosis not present

## 2020-11-09 DIAGNOSIS — M4800 Spinal stenosis, site unspecified: Secondary | ICD-10-CM | POA: Insufficient documentation

## 2020-12-08 NOTE — Chronic Care Management (AMB) (Signed)
Chronic Care Management Pharmacy  Name: Martin Chase  MRN: [id number] DOB: 10/26/1946  Chief Complaint/ HPI  [name],  75 y.o. , male presents for their Initial CCM visit with the clinical pharmacist via telephone due to COVID-19 Pandemic.  PCP : Colon Branch, MD  Their chronic conditions include: HTN, Dementia, Osteoarthritis, HLD, Prostate cancer.  Office Visits: 10/06/2020 Larose Kells) - annual physical exam.  Not [facility] as [facility] told him it can go against his memory.  They were holding this for 3 months but did not see benefit so they are going to go back on it.  Consult Visit: 09/21/20 (Neuro, [place]) - gait disturbance  Medications: Outpatient Encounter Medications as of 12/09/2020  Medication Sig Note  . atorvastatin (LIPITOR) 20 MG tablet TAKE 1 TABLET BY MOUTH  DAILY 07/13/2020: [facility] d/t [facility] issues  . Calcium Carbonate-Vit D-Min (CALCIUM 1200 PO) Take by mouth.   . Cyanocobalamin (VITAMIN B-12) 5000 MCG TBDP Take 5,000 mcg by mouth daily.   [name] donepezil (ARICEPT) 10 MG tablet Take 1 tablet (10 mg total) by mouth at bedtime.   [name] losartan (COZAAR) 50 MG tablet TAKE 1 TABLET BY MOUTH  DAILY   . Multiple Vitamin (MULTIVITAMIN WITH MINERALS) TABS tablet Take 1 tablet by mouth daily. Centrum   . Probiotic Product (PROBIOTIC PO) Take 1 Dose by mouth daily.   . tamsulosin (FLOMAX) 0.4 MG CAPS capsule Take 0.4 mg by mouth daily after supper.   . [DISCONTINUED] vitamin E 400 UNIT capsule Take 800 Units by mouth daily. (Patient not taking: Reported on 12/09/2020)    No facility-administered encounter medications on file as of 12/09/2020.     Current Diagnosis/Assessment: Emergency planning/management officer Strain: Low Risk   . Difficulty of Paying Living Expenses: Not very hard    Goals Addressed            This Visit's Progress   . Pharmacy Care Plan:       CARE PLAN ENTRY (see longitudinal plan of care for additional care plan information)  Current Barriers:   . Chronic Disease Management support, education, and care coordination needs related to Hypertension, Hyperlipidemia, and Dementia   Hypertension BP Readings from Last 3 Encounters:  10/06/20 (!) 143/73  09/21/20 122/78  07/13/20 (!) 145/71   . Pharmacist Clinical Goal(s): o Over the next 120 days, patient will work with PharmD and providers to maintain BP goal <140/90 . Current regimen:  o Losartan 45m . Interventions: o Recommended home monitoring 2 to 3 times per week . Patient self care activities - Over the next 120 days, patient will: o Check BP 2-3 times per week, document, and provide at future appointments o Ensure daily salt intake < 2300 mg/day o Maintain walking as much as possible  Hyperlipidemia Lab Results  Component Value Date/Time   LDLCALC 147 (H) 10/06/2020 01:47 PM   LEffort79 11/03/2019 10:44 AM   . Pharmacist Clinical Goal(s): o Over the next 120 days, patient will work with PharmD and providers to achieve LDL goal < 100 . Current regimen:  o Atorvastatin 297m. Interventions: o Counseled on benefits of statin therapy . Patient self care activities - Over the next 120 days, patient will: o Focus on medication adherence by pill box   Dementia . Pharmacist Clinical Goal(s) o Over the next 120 days, patient will work with PharmD and providers to optimize medication and manage symptoms of dementia. . Current regimen:  o  Donepezil 19m . Interventions: o Discussed medication management with wife . Patient self care activities - Over the next 120 days, patient will: o Continue to focus on medication adherence using pill box  Medication management . Pharmacist Clinical Goal(s): o Over the next 120 days, patient will work with PharmD and providers to maintain optimal medication adherence . Current pharmacy: [facility] mail order . Interventions o Comprehensive medication review performed. o Continue current medication management strategy . Patient  self care activities - Over the next 120 days, patient will: o Focus on medication adherence by pill box o Take medications as prescribed o Report any questions or concerns to PharmD and/or provider(s)  Initial goal documentation        Hypertension   BP goal is:  <140/90  Office blood pressures are  BP Readings from Last 3 Encounters:  10/06/20 (!) 143/73  09/21/20 122/78  07/13/20 (!) 145/71   Patient checks BP at home infrequently Patient home BP readings are ranging: No specific readings at home  Patient has failed these meds in the past: none noted Patient is currently controlled on the following medications:  . Losartan 520mdaily  We discussed  - Recommended monitoring at home 2-3 times weekly - Walks with a cane in the backyard - Discussed BP goal with wife and she is agreeable to monitoring plan - Regular monitoring initiated  Plan  Continue current medications     Hyperlipidemia   LDL goal < 100  Last lipids Lab Results  Component Value Date   CHOL 234 (H) 10/06/2020   HDL 58.50 10/06/2020   LDLCALC 147 (H) 10/06/2020   TRIG 140.0 10/06/2020   CHOLHDL 4 10/06/2020   Hepatic Function Latest Ref Rng & Units 10/06/2020 09/21/2020 11/03/2019  Total Protein 6.0 - 8.3 g/dL 7.0 7.2 6.9  Albumin 3.5 - 5.2 g/dL 4.2 - 4.2  AST 0 - 37 U/L 22 - 29  ALT 0 - 53 U/L 12 - 21  Alk Phosphatase 39 - 117 U/L 72 - 71  Total Bilirubin 0.2 - 1.2 mg/dL 0.6 - 0.6  Bilirubin, Direct 0.01 - 0.4 - - -     The 10-year ASCVD risk score (GMikey BussingC Jr., et al., 2013) is: 37.1%   Values used to calculate the score:     Age: [age]     Sex: Male     Is Non-Hispanic African American: No     Diabetic: No     Tobacco smoker: Yes     Systolic Blood Pressure: 14662mHg     Is BP treated: Yes     HDL Cholesterol: 58.5 mg/dL     Total Cholesterol: 234 mg/dL   Patient has failed these meds in past: none noted Patient is currently uncontrolled on the following medications:   . Atorvastatin 2010mWe discussed:   - Recently [facility] for ~ [facility] - Since most recent lipid panel has taken daily with no adverse effects - Counseled on importance of statin medications in cardiovascular prevention - Recommend repeat lipid panel at next OV - Reviewed lipid panel in detail  Plan  Continue current medications Prostate Cancer?   Patient has failed these meds in past: none noted Patient is currently controlled on the following medications:  . Tamsulosin 0.4mg44mily  We discussed:  - Wife reports last visit to oncology report was no cancer cells, however they have not been back for  follow up - Reports sometimes he does not empty all the way and it takes him a long time to urinate - Denies frequent night time urination, "has a bladder like a camel"  Plan  Continue current medications Mild Dementia   Patient has failed these meds in past: none noted Patient is currently controlled on the following medications:  . Donepezil 73m hs  We discussed:   - Reports his memory is a slow decline - No adverse effects to medication - Wife wants to keep him on it if it has any potential to help - Counseled that it will slow down progress  Plan  Continue current medications   Vaccines   Reviewed and discussed patient's vaccination history.    Immunization History  Administered Date(s) Administered  . Influenza Split 09/22/2011, 08/25/2014  . Influenza Whole 08/23/2007, 07/09/2009, 07/25/2010  . Influenza, High Dose Seasonal PF 08/03/2017, 08/05/2018, 08/06/2019, 08/06/2019, 08/05/2020  . Influenza-Unspecified 08/17/2015  . PFIZER(Purple Top)SARS-COV-2 Vaccination 11/25/2019, 12/16/2019, 08/23/2020  . Pneumococcal Conjugate-13 10/20/2014  . Pneumococcal Polysaccharide-23 07/03/2008, 08/03/2017  . Td 07/23/2009  . Zoster 03/25/2011    Plan  Patient also received SHINGRIX vaccine at pharmacy, dates  unclear.  Medication Management   [facility] [facility]  [facility] [place] [id number] [id number]: [phone]: [phone] [place] [place] [address] [address] [place]Phone: [phone]: [phone] Uses pill box? Yes - wife fixes a pill box for him Pt endorses 100% compliance  We discussed: Current pharmacy is preferred with insurance plan and patient is satisfied with pharmacy services  Plan  Continue current medication management strategy  Miscellaneous Medications Vitamin B-12 5,000 mcg daily - last b12 level elevated, patient could decrease of b-12.  Follow up: 4 month phone visit   CBeverly Milch PharmD Clinical Pharmacist [place]([phone]

## 2020-12-09 ENCOUNTER — Ambulatory Visit (INDEPENDENT_AMBULATORY_CARE_PROVIDER_SITE_OTHER): Payer: Medicare Other

## 2020-12-09 DIAGNOSIS — F03A0 Unspecified dementia, mild, without behavioral disturbance, psychotic disturbance, mood disturbance, and anxiety: Secondary | ICD-10-CM

## 2020-12-09 DIAGNOSIS — E785 Hyperlipidemia, unspecified: Secondary | ICD-10-CM | POA: Diagnosis not present

## 2020-12-09 DIAGNOSIS — I10 Essential (primary) hypertension: Secondary | ICD-10-CM

## 2020-12-09 DIAGNOSIS — F0390 Unspecified dementia without behavioral disturbance: Secondary | ICD-10-CM

## 2020-12-09 NOTE — Patient Instructions (Addendum)
Visit Information  Goals Addressed            This Visit's Progress   . Pharmacy Care Plan:       CARE PLAN ENTRY (see longitudinal plan of care for additional care plan information)  Current Barriers:  . Chronic Disease Management support, education, and care coordination needs related to Hypertension, Hyperlipidemia, and Dementia   Hypertension BP Readings from Last 3 Encounters:  10/06/20 (!) 143/73  09/21/20 122/78  07/13/20 (!) 145/71   . Pharmacist Clinical Goal(s): o Over the next 120 days, patient will work with PharmD and providers to maintain BP goal <140/90 . Current regimen:  o Losartan 50mg  . Interventions: o Recommended home monitoring 2 to 3 times per week . Patient self care activities - Over the next 120 days, patient will: o Check BP 2-3 times per week, document, and provide at future appointments o Ensure daily salt intake < 2300 mg/day o Maintain walking as much as possible  Hyperlipidemia Lab Results  Component Value Date/Time   LDLCALC 147 (H) 10/06/2020 01:47 PM   [place] 79 11/03/2019 10:44 AM   . Pharmacist Clinical Goal(s): o Over the next 120 days, patient will work with PharmD and providers to achieve LDL goal < 100 . Current regimen:  o Atorvastatin 20mg  . Interventions: o Counseled on benefits of statin therapy . Patient self care activities - Over the next 120 days, patient will: o Focus on medication adherence by pill box   Dementia . Pharmacist Clinical Goal(s) o Over the next 120 days, patient will work with PharmD and providers to optimize medication and manage symptoms of dementia. . Current regimen:  o Donepezil 10mg  . Interventions: o Discussed medication management with wife . Patient self care activities - Over the next 120 days, patient will: o Continue to focus on medication adherence using pill box  Medication management . Pharmacist Clinical Goal(s): o Over the next 120 days, patient will work with PharmD and  providers to maintain optimal medication adherence . Current pharmacy: [facility] . Interventions o Comprehensive medication review performed. o Continue current medication management strategy . Patient self care activities - Over the next 120 days, patient will: o Focus on medication adherence by pill box o Take medications as prescribed o Report any questions or concerns to PharmD and/or provider(s)  Initial goal documentation        The patient verbalized understanding of instructions, educational materials, and care plan provided today and agreed to receive a mailed copy of patient instructions, educational materials, and care plan.   Telephone follow up appointment with pharmacy team member scheduled for: 4 months  Edythe Clarity, [facility]  Dyslipidemia Dyslipidemia is an imbalance of waxy, fat-like substances (lipids) in the blood. The body needs lipids in small amounts. Dyslipidemia often involves a high level of cholesterol or triglycerides, which are types of lipids. Common forms of dyslipidemia include:  High levels of LDL cholesterol. LDL is the type of cholesterol that causes fatty deposits (plaques) to build up in the blood vessels that carry blood away from your heart (arteries).  Low levels of HDL cholesterol. HDL cholesterol is the type of cholesterol that protects against heart disease. High levels of HDL remove the LDL buildup from arteries.  High levels of triglycerides. Triglycerides are a fatty substance in the blood that is linked to a buildup of plaques in the arteries. What are the causes? Primary dyslipidemia is caused by changes (mutations) in genes that are passed  down through families (inherited). These mutations cause several types of dyslipidemia. Secondary dyslipidemia is caused by lifestyle choices and diseases that lead to dyslipidemia, such as:  Eating a diet that is high in animal fat.  Not getting enough exercise.  Having diabetes,  kidney disease, liver disease, or thyroid disease.  Drinking large amounts of alcohol.  Using certain medicines. What increases the risk? You are more likely to develop this condition if you are an older man or if you are a woman who has gone through menopause. Other risk factors include:  Having a family history of dyslipidemia.  Taking certain medicines, including birth control pills, steroids, some diuretics, and beta-blockers.  Smoking cigarettes.  Eating a high-fat diet.  Having certain medical conditions such as diabetes, polycystic ovary syndrome (PCOS), kidney disease, liver disease, or hypothyroidism.  Not exercising regularly.  Being overweight or obese with too much belly fat. What are the signs or symptoms? In most cases, dyslipidemia does not usually cause any symptoms. In severe cases, very high lipid levels can cause:  Fatty bumps under the skin (xanthomas).  White or gray ring around the black center (pupil) of the eye. Very high triglyceride levels can cause inflammation of the pancreas (pancreatitis). How is this diagnosed? Your health care provider may diagnose dyslipidemia based on a routine blood test (fasting blood test). Because most people do not have symptoms of the condition, this blood testing (lipid profile) is done on adults age 60 and older and is repeated every 5 years. This test checks:  Total cholesterol. This measures the total amount of cholesterol in your blood, including LDL cholesterol, HDL cholesterol, and triglycerides. A healthy number is below 200.  LDL cholesterol. The target number for LDL cholesterol is different for each person, depending on individual risk factors. Ask your health care provider what your LDL cholesterol should be.  HDL cholesterol. An HDL level of 60 or higher is best because it helps to protect against heart disease. A number below 55 for men or below 37 for women increases the risk for heart  disease.  Triglycerides. A healthy triglyceride number is below 150. If your lipid profile is abnormal, your health care provider may do other blood tests.   How is this treated? Treatment depends on the type of dyslipidemia that you have and your other risk factors for heart disease and stroke. Your health care provider will have a target range for your lipid levels based on this information. For many people, this condition may be treated by lifestyle changes, such as diet and exercise. Your health care provider may recommend that you:  Get regular exercise.  Make changes to your diet.  Quit smoking if you smoke. If diet changes and exercise do not help you reach your goals, your health care provider may also prescribe medicine to lower lipids. The most commonly prescribed type of medicine lowers your LDL cholesterol (statin drug). If you have a high triglyceride level, your provider may prescribe another type of drug (fibrate) or an omega-3 fish oil supplement, or both. Follow these instructions at home: Eating and drinking  Follow instructions from your health care provider or dietitian about eating or drinking restrictions.  Eat a healthy diet as told by your health care provider. This can help you reach and maintain a healthy weight, lower your LDL cholesterol, and raise your HDL cholesterol. This may include: ? Limiting your calories, if you are overweight. ? Eating more fruits, vegetables, whole grains, fish, and lean meats. ?  Limiting saturated fat, trans fat, and cholesterol.  If you drink alcohol: ? Limit how much you use. ? Be aware of how much alcohol is in your drink. In the [place] one drink equals one 12 oz bottle of beer (355 mL), one 5 oz glass of wine (148 mL), or one 1 oz glass of hard liquor (44 mL).  Do not drink alcohol if: ? Your health care provider tells you not to drink. ? You are pregnant, may be pregnant, or are planning to become pregnant. Activity  Get  regular exercise. Start an exercise and strength training program as told by your health care provider. Ask your health care provider what activities are safe for you. Your health care provider may recommend: ? 30 minutes of aerobic activity 4-6 days a week. Brisk walking is an example of aerobic activity. ? Strength training 2 days a week. General instructions  Do not use any products that contain nicotine or tobacco, such as cigarettes, e-cigarettes, and chewing tobacco. If you need help quitting, ask your health care provider.  Take over-the-counter and prescription medicines only as told by your health care provider. This includes supplements.  Keep all follow-up visits as told by your health care provider.   Contact a health care provider if:  You are: ? Having trouble sticking to your exercise or diet plan. ? Struggling to quit smoking or control your use of alcohol. Summary  Dyslipidemia often involves a high level of cholesterol or triglycerides, which are types of lipids.  Treatment depends on the type of dyslipidemia that you have and your other risk factors for heart disease and stroke.  For many people, treatment starts with lifestyle changes, such as diet and exercise.  Your health care provider may prescribe medicine to lower lipids. This information is not intended to replace advice given to you by your health care provider. Make sure you discuss any questions you have with your health care provider. Document Revised: 06/17/2018 Document Reviewed: 05/24/2018 Elsevier Patient Education  [place].

## 2021-02-01 ENCOUNTER — Encounter: Payer: Self-pay | Admitting: Internal Medicine

## 2021-02-01 ENCOUNTER — Ambulatory Visit (INDEPENDENT_AMBULATORY_CARE_PROVIDER_SITE_OTHER): Payer: Medicare Other | Admitting: Internal Medicine

## 2021-02-01 ENCOUNTER — Ambulatory Visit (HOSPITAL_BASED_OUTPATIENT_CLINIC_OR_DEPARTMENT_OTHER)
Admission: RE | Admit: 2021-02-01 | Discharge: 2021-02-01 | Disposition: A | Discharge status: Home / Self Care | Payer: Medicare Other | Source: Ambulatory Visit | Attending: Internal Medicine | Admitting: Internal Medicine

## 2021-02-01 ENCOUNTER — Other Ambulatory Visit: Payer: Self-pay

## 2021-02-01 VITALS — BP 124/64 | HR 63 | Temp 98.1°F | Resp 18 | Ht 71.0 in | Wt 211.0 lb

## 2021-02-01 DIAGNOSIS — M1612 Unilateral primary osteoarthritis, left hip: Secondary | ICD-10-CM | POA: Insufficient documentation

## 2021-02-01 DIAGNOSIS — R269 Unspecified abnormalities of gait and mobility: Secondary | ICD-10-CM

## 2021-02-01 DIAGNOSIS — M25552 Pain in left hip: Secondary | ICD-10-CM | POA: Diagnosis not present

## 2021-02-01 NOTE — Patient Instructions (Signed)
Please get the x-ray of your hip downstairs  Tylenol  500 mg OTC 2 tabs a day every 8 hours as needed for pain  Call if not gradually better or if you get much worse.  We are referring you back to physical therapy

## 2021-02-01 NOTE — Progress Notes (Signed)
Subjective:    Patient ID: Martin Chase, male    DOB: [birth year], 75 y.o.   MRN: [id number]  DOS:  02/01/2021 Type of visit - description: Acute visit, here with his wife  Symptoms a started 3 days ago, has pain at the anterior L inguinal area, pain is steady, unclear if it is worse with certain movements, apparently not. It radiates to the anterior left thigh.  Denies any fall, no rash, no lump or hernia. Denies back pain per se.  Also wife is concerned about his toes, they look bluish  Review of Systems See above   Past Medical History:  Diagnosis Date  . Allergy   . Anemia 1996   due to a bleeding from aspirin  . Arthritis   . Cataract   . History of blood transfusion 1996  . HOH (hard of hearing)    uses hearing aids/ hearing on left side worse  . Hyperlipidemia   . Hypertension   . Prediabetes 10/20/2014  . Prostate cancer ([place])   . Sleep apnea    uses c-pap  . Spinal stenosis     Past Surgical History:  Procedure Laterality Date  . COLONOSCOPY  07/30/2019  . COLONOSCOPY WITH PROPOFOL N/A 09/16/2018   Procedure: COLONOSCOPY WITH PROPOFOL;  Surgeon: Yetta Flock, MD;  Location: [facility];  Service: Gastroenterology;  Laterality: N/A;  . POLYPECTOMY  09/16/2018   Procedure: POLYPECTOMY;  Surgeon: Yetta Flock, MD;  Location: [facility];  Service: Gastroenterology;;  . POLYPECTOMY    . SUBMUCOSAL INJECTION  09/16/2018   Procedure: SUBMUCOSAL INJECTION;  Surgeon: Yetta Flock, MD;  Location: [facility];  Service: Gastroenterology;;  . TONSILLECTOMY    . VASECTOMY      Allergies as of 02/01/2021   No Known Allergies     Medication List       Accurate as of February 01, 2021 11:59 PM. If you have any questions, ask your nurse or doctor.        atorvastatin 20 MG tablet Commonly known as: LIPITOR TAKE 1 TABLET BY MOUTH  DAILY   CALCIUM 1200 PO Take by mouth.   donepezil 10 MG tablet Commonly known as: ARICEPT Take 1  tablet (10 mg total) by mouth at bedtime.   losartan 50 MG tablet Commonly known as: COZAAR TAKE 1 TABLET BY MOUTH  DAILY   multivitamin with minerals Tabs tablet Take 1 tablet by mouth daily. Centrum   PROBIOTIC PO Take 1 Dose by mouth daily.   tamsulosin 0.4 MG Caps capsule Commonly known as: FLOMAX Take 0.4 mg by mouth daily after supper.   Vitamin B-12 5000 MCG Tbdp Take 2,500 mcg by mouth daily.          Objective:   Physical Exam BP 124/64 (BP Location: Left Arm, Patient Position: Sitting, Cuff Size: Small)   Pulse 63   Temp 98.1 F (36.7 C) (Oral)   Resp 18   Ht 5\' 11"  (1.803 m)   Wt 211 lb (95.7 kg)   SpO2 98%   BMI 29.43 kg/m  General:   Well developed, NAD, BMI noted. HEENT:  Normocephalic . Face symmetric, atraumatic Abdomen: Lower abdomen soft, not tender. Groins: No hernia that I can tell even with Valsalva. Lower extremities: no pretibial edema bilaterally MSK: No TTP at the lumbar spine or SI joints No TTP at the trochanteric bursa's R hip: Rotation normal RL hip: Rotation somewhat limited from pain. Ankles: Noted both ankles with very decreased  range of motion.  No swelling, redness. Skin: Very long nails noted, good pedal pulses, no discoloration. Neurologic:  alert & cooperative Speech normal, gait very limited, uses a cane.  Transferring very limited and needed assistance Psych--  Cognition and judgment appear intact.  Cooperative with normal attention span and concentration.  Behavior appropriate. No anxious or depressed appearing.      Assessment     Assessment Prediabetes HTN ([facility] ~ 01/2018) Hyperlipidemia DJD - chronic back pain Allergies PUD per pt, dx early 2000s, related to ASA, was told not to take Prostate cancer: DX 12-2018, UroLift, finished external radiation treatment ~ 07/2019 OSA, on CPAP Dementia: 84/2103: Normal RPR, folic acid and X28.  11-1884: Brain MRI normal for age.  No acute.  PLAN Left hip  pain: Suspect pain as described above is coming from the hip.  Probably DJD, no recent falls.  For completeness we will get a x-ray recommend to take Tylenol only, avoid NSAIDs. Gait disorder: Gait and transferring is limited, he did PT until December 2021 and felt it was very helpful. Plan: Refer back to PT.  Gait disorder likely related to DJD and general stiffness (denies fever, headaches, weight loss).    This visit occurred during the SARS-CoV-2 public health emergency.  Safety protocols were in place, including screening questions prior to the visit, additional usage of staff PPE, and extensive cleaning of exam room while observing appropriate contact time as indicated for disinfecting solutions.

## 2021-02-02 NOTE — Assessment & Plan Note (Signed)
Left hip pain: Suspect pain as described above is coming from the hip.  Probably DJD, no recent falls.  For completeness we will get a x-ray recommend to take Tylenol only, avoid NSAIDs. Gait disorder: Gait and transferring is limited, he did PT until December 2021 and felt it was very helpful. Plan: Refer back to PT.  Gait disorder likely related to DJD and general stiffness (denies fever, headaches, weight loss).

## 2021-02-04 DIAGNOSIS — H2513 Age-related nuclear cataract, bilateral: Secondary | ICD-10-CM | POA: Diagnosis not present

## 2021-02-04 DIAGNOSIS — H52203 Unspecified astigmatism, bilateral: Secondary | ICD-10-CM | POA: Diagnosis not present

## 2021-02-08 DIAGNOSIS — R2681 Unsteadiness on feet: Secondary | ICD-10-CM | POA: Diagnosis not present

## 2021-02-08 DIAGNOSIS — M62552 Muscle wasting and atrophy, not elsewhere classified, left thigh: Secondary | ICD-10-CM | POA: Diagnosis not present

## 2021-02-08 DIAGNOSIS — M6281 Muscle weakness (generalized): Secondary | ICD-10-CM | POA: Diagnosis not present

## 2021-02-08 DIAGNOSIS — M25552 Pain in left hip: Secondary | ICD-10-CM | POA: Diagnosis not present

## 2021-02-09 ENCOUNTER — Telehealth: Payer: Self-pay

## 2021-02-09 DIAGNOSIS — R2681 Unsteadiness on feet: Secondary | ICD-10-CM | POA: Diagnosis not present

## 2021-02-09 DIAGNOSIS — M25552 Pain in left hip: Secondary | ICD-10-CM | POA: Diagnosis not present

## 2021-02-09 DIAGNOSIS — M6281 Muscle weakness (generalized): Secondary | ICD-10-CM | POA: Diagnosis not present

## 2021-02-09 DIAGNOSIS — M62552 Muscle wasting and atrophy, not elsewhere classified, left thigh: Secondary | ICD-10-CM | POA: Diagnosis not present

## 2021-02-09 NOTE — Telephone Encounter (Signed)
PT plan of care signed and faxed back to [facility] PT. Form sent for scanning.

## 2021-02-14 ENCOUNTER — Ambulatory Visit: Payer: Medicare Other | Admitting: Neurology

## 2021-02-14 ENCOUNTER — Encounter: Payer: Self-pay | Admitting: Neurology

## 2021-02-14 VITALS — BP 127/54 | HR 58 | Ht 71.0 in | Wt 210.5 lb

## 2021-02-14 DIAGNOSIS — G629 Polyneuropathy, unspecified: Secondary | ICD-10-CM

## 2021-02-14 DIAGNOSIS — R413 Other amnesia: Secondary | ICD-10-CM | POA: Diagnosis not present

## 2021-02-14 DIAGNOSIS — M4807 Spinal stenosis, lumbosacral region: Secondary | ICD-10-CM

## 2021-02-14 DIAGNOSIS — G6181 Chronic inflammatory demyelinating polyneuritis: Secondary | ICD-10-CM | POA: Diagnosis not present

## 2021-02-14 DIAGNOSIS — R269 Unspecified abnormalities of gait and mobility: Secondary | ICD-10-CM | POA: Diagnosis not present

## 2021-02-14 MED ORDER — MEMANTINE HCL 10 MG PO TABS: ORAL_TABLET | ORAL | 11 refills | Status: DC

## 2021-02-14 NOTE — Progress Notes (Signed)
[facility]  PATIENT: Martin Chase DOB: 04-07-1946  REFERRING DOCTOR OR PCP: Belinda Fisher SOURCE: Patient, notes from PCP, imaging and lab reports, MRI images personally reviewed.  _________________________________   HISTORICAL  CHIEF COMPLAINT:  Chief Complaint  Patient presents with  . Follow-up    RM 12 with wife. Last seen 09/21/2020. Last [facility] 17/30. No falls since last seen, ambulates with cane.     HISTORY OF PRESENT ILLNESS:  Harriett Sine, at [facility] for neurologic consultation regarding his gait disturbance and episode of unresponsiveness.. Additionally, he has memory loss.   Since the last visit he has had a NCV/EMG.    He feels his memory is fine but his wife notes he is doing worse than last year.    He notes difficulty planning projects aroubd the house.    He does not handle any financial tasks.    He takes donepezil.     He feels the numbness in his feet is about the same as his llast visit.   He feels balance is unchanged and his wife feels his gait is mildly worse.     He has no more episodes of being unresponsive   He is a 75 year old man who has had progressive gait disturbance and several falls.   His uses a cane for the past month and he feels more stable.  He had a fall at the end of October when he stepped off a curb and fell.  He did not have any significant injuries.   He feels his legs have mildly weakened with age but not significantly so.  He denies numbness.  He has some urinary hesitancy and has been told his bladder was enlarged on U/S.  He does not have urinary urgency.    He is doing physical therapy twice a week with [facility].  In September he was sitting on a barstool with a glazed look in his eyes and he did not respond to his wife.  He recalls the event and recalls falling to the floor.  He does not recall being lightheaded.   911 was called and the paramedics helped him to the sofa.    His vital signs were fine and he was quickly back to baseline.   Due to Covid-19 they opted not to go to the ED.    His wife did not note any shaking, tongue biting or incontinence.  He did have sweating.    A while back he had another slide off the barstool but was not unresponsive first (just felt off balanced as he slid off).    His wife has also noted that he has had some cognitive difficulties over the last year that have progressed.  She notes reduced short-term memory.  Imaging studies personally reviewed: MRI of the brain 11/08/2019 showed mild generalized cortical atrophy and minimal chronic microvascular ischemic changes in the hemispheres and pons.  Mild ethmoid chronic sinusitis.  There were no acute findings.   NCV/EMG 10/19/2020: This NCV/EMG study shows the following: 1.   Moderate length dependent demyelinating and axonal polyneuropathy affecting the sensory and motor fibers more than the autonomic fibers..  There were no active features. 2.   Superimposed L3-S1 chronic radiculopathies.  This could be due to spinal stenosis.  Labs 09/21/2020:   SPEP/IEF showed polyclomal IgA elevation, HgbA1c was mildly elevated at 6.0   B12, SSA/SSB were fine.       Montreal Cognitive Assessment  02/14/2021  09/21/2020  Visuospatial/ Executive (0/5) 0 1  Naming (0/3) 3 3  Attention: Read list of digits (0/2) 1 2  Attention: Read list of letters (0/1) 0 0  Attention: Serial 7 subtraction starting at 100 (0/3) 2 3  Language: Repeat phrase (0/2) 1 2  Language : Fluency (0/1) 0 0  Abstraction (0/2) 2 1  Delayed Recall (0/5) 0 4  Orientation (0/6) 2 1  Total 11 17  Adjusted Score (based on education) 11 17     REVIEW OF SYSTEMS: Constitutional: No fevers, chills, sweats, or change in appetite Eyes: No visual changes, double vision, eye pain Ear, nose and throat: No hearing loss, ear pain, nasal congestion, sore throat Cardiovascular: No chest pain, palpitations Respiratory: No shortness  of breath at rest or with exertion.   No wheezes GastrointestinaI: No nausea, vomiting, diarrhea, abdominal pain, fecal incontinence Genitourinary: No dysuria, urinary retention or frequency.  No nocturia. Musculoskeletal: No neck pain, back pain Integumentary: No rash, pruritus, skin lesions Neurological: as above Psychiatric: No depression at this time.  No anxiety Endocrine: No palpitations, diaphoresis, change in appetite, change in weigh or increased thirst Hematologic/Lymphatic: No anemia, purpura, petechiae. Allergic/Immunologic: No itchy/runny eyes, nasal congestion, recent allergic reactions, rashes  ALLERGIES: No Known Allergies  HOME MEDICATIONS:  Current Outpatient Medications:  .  atorvastatin (LIPITOR) 20 MG tablet, TAKE 1 TABLET BY MOUTH  DAILY, Disp: 90 tablet, Rfl: 3 .  Calcium Carbonate-Vit D-Min (CALCIUM 1200 PO), Take by mouth., Disp: , Rfl:  .  Cyanocobalamin (VITAMIN B-12) 5000 MCG TBDP, Take 2,500 mcg by mouth daily., Disp: , Rfl:  .  donepezil (ARICEPT) 10 MG tablet, Take 1 tablet (10 mg total) by mouth at bedtime., Disp: 90 tablet, Rfl: 3 .  losartan (COZAAR) 50 MG tablet, TAKE 1 TABLET BY MOUTH  DAILY, Disp: 90 tablet, Rfl: 3 .  memantine (NAMENDA) 10 MG tablet, Take 1/2 pill daily x 1 week, then 1/2 pill twice daily x 1 week, then 1 pill twice a day, Disp: 60 tablet, Rfl: 11 .  Multiple Vitamin (MULTIVITAMIN WITH MINERALS) TABS tablet, Take 1 tablet by mouth daily. Centrum, Disp: , Rfl:  .  Probiotic Product (PROBIOTIC PO), Take 1 Dose by mouth daily., Disp: , Rfl:  .  tamsulosin (FLOMAX) 0.4 MG CAPS capsule, Take 0.4 mg by mouth daily after supper., Disp: , Rfl:   PAST MEDICAL HISTORY: Past Medical History:  Diagnosis Date  . Allergy   . Anemia 1996   due to a bleeding from aspirin  . Arthritis   . Cataract   . History of blood transfusion 1996  . HOH (hard of hearing)    uses hearing aids/ hearing on left side worse  . Hyperlipidemia   .  Hypertension   . Prediabetes 10/20/2014  . Prostate cancer ([place])   . Sleep apnea    uses c-pap  . Spinal stenosis     PAST SURGICAL HISTORY: Past Surgical History:  Procedure Laterality Date  . COLONOSCOPY  07/30/2019  . COLONOSCOPY WITH PROPOFOL N/A 09/16/2018   Procedure: COLONOSCOPY WITH PROPOFOL;  Surgeon: Yetta Flock, MD;  Location: [facility];  Service: Gastroenterology;  Laterality: N/A;  . POLYPECTOMY  09/16/2018   Procedure: POLYPECTOMY;  Surgeon: Yetta Flock, MD;  Location: [facility];  Service: Gastroenterology;;  . POLYPECTOMY    . SUBMUCOSAL INJECTION  09/16/2018   Procedure: SUBMUCOSAL INJECTION;  Surgeon: Yetta Flock, MD;  Location: [facility];  Service: Gastroenterology;;  . TONSILLECTOMY    .  VASECTOMY      FAMILY HISTORY: Family History  Problem Relation Age of Onset  . Diabetes Mother   . Alzheimer's disease Mother   . Prostate cancer Father 76       prostatectomy?  . Diabetes Maternal Uncle   . Stroke Paternal Uncle   . Diabetes Maternal Grandmother   . Heart disease Maternal Grandfather   . Suicidality Brother   . Colon cancer Neg Hx   . Rectal cancer Neg Hx   . Esophageal cancer Neg Hx   . Stomach cancer Neg Hx   . Colon polyps Neg Hx     SOCIAL HISTORY:  Social History   Socioeconomic History  . Marital status: Married    Spouse name: Patsy  . Number of children: 0  . Years of education: college  . Highest education level: Not on file  Occupational History  . Occupation: retired, [employer]  Tobacco Use  . Smoking status: Former Smoker    Packs/day: 1.00    Years: 5.00    Pack years: 5.00    Types: Cigarettes    Quit date: 01/29/1986    Years since quitting: 35.0  . Smokeless tobacco: Never Used  Vaping Use  . Vaping Use: Never used  Substance and Sexual Activity  . Alcohol use: Yes    Alcohol/week: 7.0 standard drinks    Types: 7 Glasses of wine per week    Comment: 1 glass of  wine with dinner  . Drug use: No  . Sexual activity: Not Currently  Other Topics Concern  . Not on file  Social History Narrative   Lives with wife   Household -- pt and wife   Caffeine use: 2 cups coffee per day   Right handed    Social Determinants of Health   Financial Resource Strain: Low Risk   . Difficulty of Paying Living Expenses: Not very hard  Food Insecurity: Not on file  Transportation Needs: Not on file  Physical Activity: Not on file  Stress: Not on file  Social Connections: Not on file  Intimate Partner Violence: Not on file     PHYSICAL EXAM  Vitals:   02/14/21 1046  BP: (!) 127/54  Pulse: (!) 58  Weight: 210 lb 8 oz (95.5 kg)  Height: 5\' 11"  (1.803 m)    Body mass index is 29.36 kg/m.   General: The patient is well-developed and well-nourished and in no acute distress  HEENT:  Head is [place]/AT.  Sclera are anicteric.   Skin: Extremities are without rash or  edema.  Neurologic Exam  Mental status: The patient is alert and oriented x 2 (3/6 on orientation questions) at the time of the examination. The patient has reduced short-term memory.  Montreal cognitive assessment score was 11/30 consistent with  moderate dementia (worse),      Executive function was reduced.  Speech is normal.  Cranial nerves: Extraocular movements are full. Pupils are equal, round, and reactive to light and accomodation.  Normal facial strength/sensation. l. No dysarthria is noted.  Reduced hearing L worse then R noted.  Motor:  Muscle bulk is normal.   Tone is normal. Strength is  5 / 5 in all 4 extremities except 4/5 EHL and 4+/5 ankle extensors.    Sensory: Sensory testing is intact to pinprick, soft touch and vibration sensation in the arms but reduced pinprick at ankles .   Reduced vibration at ankles and very reduced at toes, normal at knees..  Coordination:  Cerebellar testing reveals good finger-nose-finger and heel-to-shin bilaterally.  Gait and station: Station is  normal.   Gait has mildly reduced stride and is mildly wide.  He looks down to walk without his cane. He turns 180 degrees in 3-4 steps.   Has only mild retropulsion.   Poor tandem . Romberg is negative.   Reflexes: Deep tendon reflexes are symmetric and normal in arms, 3 at the knees and absent at ankles.   Plantar responses are flexor.    DIAGNOSTIC DATA (LABS, IMAGING, TESTING) - I reviewed patient records, labs, notes, testing and imaging myself where available.  Lab Results  Component Value Date   WBC 6.6 11/03/2019   HGB 12.9 (L) 11/03/2019   HCT 39.0 11/03/2019   MCV 93.9 11/03/2019   PLT 241.0 11/03/2019      Component Value Date/Time   NA 139 10/06/2020 1347   NA 139 01/02/2018 0000   K 4.4 10/06/2020 1347   CL 100 10/06/2020 1347   CO2 30 10/06/2020 1347   GLUCOSE 84 10/06/2020 1347   BUN 21 10/06/2020 1347   BUN 21 01/02/2018 0000   CREATININE 0.84 10/06/2020 1347   CALCIUM 9.3 10/06/2020 1347   PROT 7.0 10/06/2020 1347   PROT 7.2 09/21/2020 1157   ALBUMIN 4.2 10/06/2020 1347   AST 22 10/06/2020 1347   ALT 12 10/06/2020 1347   ALKPHOS 72 10/06/2020 1347   BILITOT 0.6 10/06/2020 1347   GFRNONAA 110.80 07/25/2010 0933   GFRAA 110 06/26/2008 0948   Lab Results  Component Value Date   CHOL 234 (H) 10/06/2020   HDL 58.50 10/06/2020   LDLCALC 147 (H) 10/06/2020   TRIG 140.0 10/06/2020   CHOLHDL 4 10/06/2020   Lab Results  Component Value Date   HGBA1C 6.0 (H) 09/21/2020   Lab Results  Component Value Date   VITAMINB12 >2000 (H) 09/21/2020   Lab Results  Component Value Date   TSH 4.58 (H) 10/06/2020       ASSESSMENT AND PLAN  Chronic inflammatory demyelinating polyneuritis (HCC) - Plan: DG FL GUIDED LUMBAR PUNCTURE  Memory loss  Polyneuropathy  Spinal stenosis of lumbosacral region  Gait disturbance   1.   Memory issues are likely Alzheimer's disease.  I will add memantine 2.   He has a demyelinating > axonal polyneuropathy ---  consider CIDP.  We will check CSF and start a steroid if elevated protein.    3.   Use cane for safety.   Discussed safety around his home due to polyneuropathy. 4.   rtc 4 months or sooner if new or worsening issues.     Kingslee Dowse A. [name], MD, [facility] 01/04/6009, 93:23 AM Certified in Neurology, Clinical Neurophysiology, Sleep Medicine and Neuroimaging  [facility] [address], [place], [place] [address] [phone]

## 2021-02-15 ENCOUNTER — Other Ambulatory Visit: Payer: Self-pay | Admitting: Internal Medicine

## 2021-02-15 ENCOUNTER — Other Ambulatory Visit: Payer: Self-pay | Admitting: Family

## 2021-02-15 DIAGNOSIS — M25552 Pain in left hip: Secondary | ICD-10-CM | POA: Diagnosis not present

## 2021-02-15 DIAGNOSIS — R2681 Unsteadiness on feet: Secondary | ICD-10-CM | POA: Diagnosis not present

## 2021-02-15 DIAGNOSIS — M62552 Muscle wasting and atrophy, not elsewhere classified, left thigh: Secondary | ICD-10-CM | POA: Diagnosis not present

## 2021-02-15 DIAGNOSIS — M6281 Muscle weakness (generalized): Secondary | ICD-10-CM | POA: Diagnosis not present

## 2021-02-16 DIAGNOSIS — M6281 Muscle weakness (generalized): Secondary | ICD-10-CM | POA: Diagnosis not present

## 2021-02-16 DIAGNOSIS — M62552 Muscle wasting and atrophy, not elsewhere classified, left thigh: Secondary | ICD-10-CM | POA: Diagnosis not present

## 2021-02-16 DIAGNOSIS — M25552 Pain in left hip: Secondary | ICD-10-CM | POA: Diagnosis not present

## 2021-02-16 DIAGNOSIS — R2681 Unsteadiness on feet: Secondary | ICD-10-CM | POA: Diagnosis not present

## 2021-02-17 DIAGNOSIS — M6281 Muscle weakness (generalized): Secondary | ICD-10-CM | POA: Diagnosis not present

## 2021-02-17 DIAGNOSIS — R2681 Unsteadiness on feet: Secondary | ICD-10-CM | POA: Diagnosis not present

## 2021-02-17 DIAGNOSIS — M62552 Muscle wasting and atrophy, not elsewhere classified, left thigh: Secondary | ICD-10-CM | POA: Diagnosis not present

## 2021-02-17 DIAGNOSIS — M25552 Pain in left hip: Secondary | ICD-10-CM | POA: Diagnosis not present

## 2021-02-18 ENCOUNTER — Ambulatory Visit
Admission: RE | Admit: 2021-02-18 | Discharge: 2021-02-18 | Disposition: A | Discharge status: Home / Self Care | Payer: Medicare Other | Source: Ambulatory Visit | Attending: Neurology | Admitting: Neurology

## 2021-02-18 ENCOUNTER — Other Ambulatory Visit: Payer: Self-pay

## 2021-02-18 DIAGNOSIS — G629 Polyneuropathy, unspecified: Secondary | ICD-10-CM

## 2021-02-18 DIAGNOSIS — G6181 Chronic inflammatory demyelinating polyneuritis: Secondary | ICD-10-CM

## 2021-02-18 DIAGNOSIS — G6189 Other inflammatory polyneuropathies: Secondary | ICD-10-CM | POA: Diagnosis not present

## 2021-02-18 NOTE — Discharge Instructions (Signed)

## 2021-02-18 NOTE — Progress Notes (Signed)
42ml of serum drawn from pts Left hand to be sent with LP lab work. 1 successful attempt, pt tolerated well. Gauze and tape applied after. Discharge instructions reviewed with patient and his spouse.

## 2021-02-22 DIAGNOSIS — M6281 Muscle weakness (generalized): Secondary | ICD-10-CM | POA: Diagnosis not present

## 2021-02-22 DIAGNOSIS — R2681 Unsteadiness on feet: Secondary | ICD-10-CM | POA: Diagnosis not present

## 2021-02-22 DIAGNOSIS — M62552 Muscle wasting and atrophy, not elsewhere classified, left thigh: Secondary | ICD-10-CM | POA: Diagnosis not present

## 2021-02-22 DIAGNOSIS — M25552 Pain in left hip: Secondary | ICD-10-CM | POA: Diagnosis not present

## 2021-02-23 DIAGNOSIS — R2681 Unsteadiness on feet: Secondary | ICD-10-CM | POA: Diagnosis not present

## 2021-02-23 DIAGNOSIS — M62552 Muscle wasting and atrophy, not elsewhere classified, left thigh: Secondary | ICD-10-CM | POA: Diagnosis not present

## 2021-02-23 DIAGNOSIS — M25552 Pain in left hip: Secondary | ICD-10-CM | POA: Diagnosis not present

## 2021-02-23 DIAGNOSIS — M6281 Muscle weakness (generalized): Secondary | ICD-10-CM | POA: Diagnosis not present

## 2021-02-23 LAB — CSF CELL COUNT WITH DIFFERENTIAL
RBC Count, CSF: 490 cells/uL — ABNORMAL HIGH
WBC, CSF: 1 cells/uL (ref 0–5)

## 2021-02-23 LAB — CNS IGG SYNTHESIS RATE, CSF+BLOOD
Albumin Serum: 4.5 g/dL (ref 3.2–4.6)
Albumin, CSF: 30.3 mg/dL (ref 8.0–42.0)
CNS-IgG Synthesis Rate: 2 mg/24 h (ref <=3.3)
IgG (Immunoglobin G), Serum: 802 mg/dL (ref 600–1540)
IgG Total CSF: 3.4 mg/dL (ref 0.8–7.7)
IgG-Index: 0.63 (ref <0.66)

## 2021-02-23 LAB — GLUCOSE, CSF: Glucose, CSF: 63 mg/dL (ref 40–80)

## 2021-02-23 LAB — OLIGOCLONAL BANDS, CSF + SERM

## 2021-02-23 LAB — VDRL, CSF: VDRL Quant, CSF: NONREACTIVE

## 2021-02-23 LAB — PROTEIN, CSF: Total Protein, CSF: 61 mg/dL — ABNORMAL HIGH (ref 15–60)

## 2021-02-24 DIAGNOSIS — M62552 Muscle wasting and atrophy, not elsewhere classified, left thigh: Secondary | ICD-10-CM | POA: Diagnosis not present

## 2021-02-24 DIAGNOSIS — M25552 Pain in left hip: Secondary | ICD-10-CM | POA: Diagnosis not present

## 2021-02-24 DIAGNOSIS — R2681 Unsteadiness on feet: Secondary | ICD-10-CM | POA: Diagnosis not present

## 2021-02-24 DIAGNOSIS — M6281 Muscle weakness (generalized): Secondary | ICD-10-CM | POA: Diagnosis not present

## 2021-03-07 ENCOUNTER — Telehealth: Payer: Self-pay | Admitting: *Deleted

## 2021-03-07 MED ORDER — PREDNISONE 20 MG PO TABS: ORAL_TABLET | ORAL | 3 refills | Status: DC

## 2021-03-07 NOTE — Telephone Encounter (Signed)
Called and spoke w/ wife about results per Dr. Garth Bigness note. She verbalized understanding. E-scribed prednisone to [facility] on [address]. Scheduled follow up for 04/18/21 at 2:30p w/ Dr. [name].

## 2021-03-07 NOTE — Telephone Encounter (Signed)
-----   Message from Britt Bottom, MD sent at 03/05/2021  1:21 PM EDT ----- Regarding: CSF results The CSF protein was elevated - this can be seen with inflammatory polyneuropathy.  I would like him to start prednisone   60 mg a day script:  20 mg  #100 three po qd  #3   Follow up with me in 6-8 weeks.    If dementia worsens reduce the prednisone to 20 mg (one pill) a day

## 2021-03-09 DIAGNOSIS — M25552 Pain in left hip: Secondary | ICD-10-CM | POA: Diagnosis not present

## 2021-03-09 DIAGNOSIS — M6281 Muscle weakness (generalized): Secondary | ICD-10-CM | POA: Diagnosis not present

## 2021-03-09 DIAGNOSIS — R2681 Unsteadiness on feet: Secondary | ICD-10-CM | POA: Diagnosis not present

## 2021-03-09 DIAGNOSIS — M62552 Muscle wasting and atrophy, not elsewhere classified, left thigh: Secondary | ICD-10-CM | POA: Diagnosis not present

## 2021-03-10 ENCOUNTER — Telehealth: Payer: Self-pay | Admitting: Pharmacist

## 2021-03-10 NOTE — Chronic Care Management (AMB) (Signed)
From Clinical Pharmacist Assistant's phone note:   "Patients wife states he is taking prednisone 20 mg.  03/07/21- Telephone encounter Roberts Gaudy, RN-start prednisone 60 mg daily.Sent in prednisone 20 mg Take 3 tablets daily per Dr.Sater"  Reviewed noted from neurology office and fill history for prednisone - filled at [facility] for prednisone 20mg  - #100 = 34 DS take 3 tabs qd.   Called patient's wife and she endorsed that she is giving patient 3 tablets of prednisone 20mg  every day after breakfast. Patient is getting correct dose.   Mrs. Stick also states patient seems to be improving already; Was able to remember the date today and has been more interested in going outside.   Already have appt with patient next month for f/u CCM.   Total time spent with patient care activities and chart review = 10 minutes.   Cherre Robins, PharmD Clinical Pharmacist [place] [facility]

## 2021-03-10 NOTE — Chronic Care Management (AMB) (Signed)
Chronic Care Management Pharmacy Assistant   Name: Martin Chase  MRN: [id number] DOB: 1946-02-19 .  Reason for Encounter: Disease State- Hypertension    Recent office visits:  02/01/2021 Martin November, MD 01/11/2021-Martin Chase   Recent consult visits:  02/14/2021 Martin A. [name], MD  (Neurology) Started on Memantine 10 mg.  Hospital visits:  None in previous 6 months  Medications: Outpatient Encounter Medications as of 03/10/2021  Medication Sig  . atorvastatin (LIPITOR) 20 MG tablet TAKE 1 TABLET BY MOUTH  DAILY  . Calcium Carbonate-Vit D-Min (CALCIUM 1200 PO) Take by mouth.  . Cyanocobalamin (VITAMIN B-12) 5000 MCG TBDP Take 2,500 mcg by mouth daily.  [name] donepezil (ARICEPT) 10 MG tablet Take 1 tablet (10 mg total) by mouth at bedtime.  [name] losartan (COZAAR) 50 MG tablet TAKE 1 TABLET BY MOUTH  DAILY  . memantine (NAMENDA) 10 MG tablet Take 1/2 pill daily x 1 week, then 1/2 pill twice daily x 1 week, then 1 pill twice a day  . Multiple Vitamin (MULTIVITAMIN WITH MINERALS) TABS tablet Take 1 tablet by mouth daily. Centrum  . predniSONE (DELTASONE) 20 MG tablet Take 3 tablets (60mg  total) by mouth daily  . Probiotic Product (PROBIOTIC PO) Take 1 Dose by mouth daily.  . tamsulosin (FLOMAX) 0.4 MG CAPS capsule Take 0.4 mg by mouth daily after supper.   No facility-administered encounter medications on file as of 03/10/2021.   Reviewed chart prior to disease state call. Spoke with patient regarding BP  Recent Office Vitals: BP Readings from Last 3 Encounters:  02/14/21 (!) 127/54  02/01/21 124/64  10/06/20 (!) 143/73   Pulse Readings from Last 3 Encounters:  02/14/21 (!) 58  02/01/21 63  10/06/20 65    Wt Readings from Last 3 Encounters:  02/14/21 210 lb 8 oz (95.5 kg)  02/01/21 211 lb (95.7 kg)  10/06/20 211 lb 12.8 oz (96.1 kg)     Kidney Function Lab Results  Component Value Date/Time   CREATININE 0.84 10/06/2020 01:47 PM   CREATININE 0.90 04/01/2020 10:47  AM   GFR 84.99 10/06/2020 01:47 PM   GFRNONAA 110.80 07/25/2010 09:33 AM   GFRAA 110 06/26/2008 09:48 AM    BMP Latest Ref Rng & Units 10/06/2020 04/01/2020 11/03/2019  Glucose 70 - 99 mg/dL 84 99 111(H)  BUN 6 - 23 mg/dL 21 17 17   Creatinine 0.40 - 1.50 mg/dL 0.84 0.90 0.83  Sodium 135 - 145 mEq/L 139 141 139  Potassium 3.5 - 5.1 mEq/L 4.4 4.9 4.8  Chloride 96 - 112 mEq/L 100 102 103  CO2 19 - 32 mEq/L 30 34(H) 31  Calcium 8.4 - 10.5 mg/dL 9.3 9.7 9.3    . Current antihypertensive regimen:   Losartan 50 mg Take 1 tab  daily   . How often are you checking your Blood Pressure? infrequently but will start to check 2-3 times per week.  . Current home BP readings: Patients wife states he does not check his blood pressure but will start to check it 2-3 times per week.Last reading 01/10/21 106/58. * . What recent interventions/DTPs have been made by any provider to improve Blood Pressure control since last CPP Visit: None noted  . Any recent hospitalizations or ED visits since last visit with CPP? No   . What diet changes have been made to improve Blood Pressure Control?  o Patients wife states they eat more vegetables and avoids snacks and salts. Quaker oats cereal with bananas for breakfast,  lunch he will have tuna or ham sandwich, dinner: patient has a salad with lasagna fo dinner, greens, apples.  . What exercise is being done to improve your Blood Pressure Control?  o Patients wife states he is in physical therapy 2 times per week.He also walks to the mailbox and in the back yard.  Adherence Review: Is the patient currently on ACE/ARB medication? Yes Does the patient have >5 day gap between last estimated fill dates? Yes   Patients wife states he is taking prednisone 20 mg.  03/07/21- Telephone encounter Roberts Gaudy, RN-start prednisone 60 mg daily.Sent in prednisone 20 mg Take 3 tablets daily per Dr.Sater.  Star Rating Drugs: Atorvastatin 20 mg Last filled: 01/06/2021  90DS Losartan 50 mg Last filled: 01/05/2021 [place] Clinical Pharmacist Assistant [phone]

## 2021-03-11 DIAGNOSIS — M25552 Pain in left hip: Secondary | ICD-10-CM | POA: Diagnosis not present

## 2021-03-11 DIAGNOSIS — R2681 Unsteadiness on feet: Secondary | ICD-10-CM | POA: Diagnosis not present

## 2021-03-11 DIAGNOSIS — M6281 Muscle weakness (generalized): Secondary | ICD-10-CM | POA: Diagnosis not present

## 2021-03-11 DIAGNOSIS — M62552 Muscle wasting and atrophy, not elsewhere classified, left thigh: Secondary | ICD-10-CM | POA: Diagnosis not present

## 2021-03-15 DIAGNOSIS — M6281 Muscle weakness (generalized): Secondary | ICD-10-CM | POA: Diagnosis not present

## 2021-03-15 DIAGNOSIS — M25552 Pain in left hip: Secondary | ICD-10-CM | POA: Diagnosis not present

## 2021-03-15 DIAGNOSIS — R2681 Unsteadiness on feet: Secondary | ICD-10-CM | POA: Diagnosis not present

## 2021-03-15 DIAGNOSIS — M62552 Muscle wasting and atrophy, not elsewhere classified, left thigh: Secondary | ICD-10-CM | POA: Diagnosis not present

## 2021-03-17 DIAGNOSIS — M62552 Muscle wasting and atrophy, not elsewhere classified, left thigh: Secondary | ICD-10-CM | POA: Diagnosis not present

## 2021-03-17 DIAGNOSIS — M6281 Muscle weakness (generalized): Secondary | ICD-10-CM | POA: Diagnosis not present

## 2021-03-17 DIAGNOSIS — M25552 Pain in left hip: Secondary | ICD-10-CM | POA: Diagnosis not present

## 2021-03-17 DIAGNOSIS — R2681 Unsteadiness on feet: Secondary | ICD-10-CM | POA: Diagnosis not present

## 2021-03-21 DIAGNOSIS — G4733 Obstructive sleep apnea (adult) (pediatric): Secondary | ICD-10-CM | POA: Diagnosis not present

## 2021-03-22 ENCOUNTER — Encounter: Payer: Self-pay | Admitting: Internal Medicine

## 2021-03-24 DIAGNOSIS — M62552 Muscle wasting and atrophy, not elsewhere classified, left thigh: Secondary | ICD-10-CM | POA: Diagnosis not present

## 2021-03-24 DIAGNOSIS — M25552 Pain in left hip: Secondary | ICD-10-CM | POA: Diagnosis not present

## 2021-03-24 DIAGNOSIS — R2681 Unsteadiness on feet: Secondary | ICD-10-CM | POA: Diagnosis not present

## 2021-03-24 DIAGNOSIS — M6281 Muscle weakness (generalized): Secondary | ICD-10-CM | POA: Diagnosis not present

## 2021-03-31 LAB — PSA: PSA: 0.07

## 2021-04-06 ENCOUNTER — Other Ambulatory Visit: Payer: Self-pay

## 2021-04-06 ENCOUNTER — Ambulatory Visit (INDEPENDENT_AMBULATORY_CARE_PROVIDER_SITE_OTHER): Payer: Medicare Other | Admitting: Internal Medicine

## 2021-04-06 ENCOUNTER — Encounter: Payer: Self-pay | Admitting: Internal Medicine

## 2021-04-06 VITALS — BP 132/80 | HR 57 | Temp 98.4°F | Resp 18 | Ht 71.0 in | Wt 208.2 lb

## 2021-04-06 DIAGNOSIS — F0390 Unspecified dementia without behavioral disturbance: Secondary | ICD-10-CM

## 2021-04-06 DIAGNOSIS — E785 Hyperlipidemia, unspecified: Secondary | ICD-10-CM

## 2021-04-06 DIAGNOSIS — I10 Essential (primary) hypertension: Secondary | ICD-10-CM

## 2021-04-06 DIAGNOSIS — G6181 Chronic inflammatory demyelinating polyneuritis: Secondary | ICD-10-CM | POA: Diagnosis not present

## 2021-04-06 DIAGNOSIS — F03A0 Unspecified dementia, mild, without behavioral disturbance, psychotic disturbance, mood disturbance, and anxiety: Secondary | ICD-10-CM

## 2021-04-06 DIAGNOSIS — R739 Hyperglycemia, unspecified: Secondary | ICD-10-CM

## 2021-04-06 DIAGNOSIS — E038 Other specified hypothyroidism: Secondary | ICD-10-CM | POA: Diagnosis not present

## 2021-04-06 LAB — T4, FREE: Free T4: 0.76 ng/dL (ref 0.60–1.60)

## 2021-04-06 LAB — CBC WITH DIFFERENTIAL/PLATELET
Basophils Absolute: 0 10*3/uL (ref 0.0–0.1)
Basophils Relative: 0.2 % (ref 0.0–3.0)
Eosinophils Absolute: 0.1 10*3/uL (ref 0.0–0.7)
Eosinophils Relative: 0.5 % (ref 0.0–5.0)
HCT: 39.5 % (ref 39.0–52.0)
Hemoglobin: 13.2 g/dL (ref 13.0–17.0)
Lymphocytes Relative: 8.9 % — ABNORMAL LOW (ref 12.0–46.0)
Lymphs Abs: 0.9 10*3/uL (ref 0.7–4.0)
MCHC: 33.4 g/dL (ref 30.0–36.0)
MCV: 91.6 fl (ref 78.0–100.0)
Monocytes Absolute: 0.4 10*3/uL (ref 0.1–1.0)
Monocytes Relative: 4.3 % (ref 3.0–12.0)
Neutro Abs: 8.9 10*3/uL — ABNORMAL HIGH (ref 1.4–7.7)
Neutrophils Relative %: 86.1 % — ABNORMAL HIGH (ref 43.0–77.0)
Platelets: 167 10*3/uL (ref 150.0–400.0)
RBC: 4.31 Mil/uL (ref 4.22–5.81)
RDW: 16.4 % — ABNORMAL HIGH (ref 11.5–15.5)
WBC: 10.3 10*3/uL (ref 4.0–10.5)

## 2021-04-06 LAB — COMPREHENSIVE METABOLIC PANEL
ALT: 22 U/L (ref 0–53)
AST: 12 U/L (ref 0–37)
Albumin: 3.8 g/dL (ref 3.5–5.2)
Alkaline Phosphatase: 55 U/L (ref 39–117)
BUN: 31 mg/dL — ABNORMAL HIGH (ref 6–23)
CO2: 32 mEq/L (ref 19–32)
Calcium: 9.5 mg/dL (ref 8.4–10.5)
Chloride: 102 mEq/L (ref 96–112)
Creatinine, Ser: 1.09 mg/dL (ref 0.40–1.50)
GFR: 65.92 mL/min (ref >60.00)
Glucose, Bld: 105 mg/dL — ABNORMAL HIGH (ref 70–99)
Potassium: 4.9 mEq/L (ref 3.5–5.1)
Sodium: 141 mEq/L (ref 135–145)
Total Bilirubin: 0.8 mg/dL (ref 0.2–1.2)
Total Protein: 5.9 g/dL — ABNORMAL LOW (ref 6.0–8.3)

## 2021-04-06 LAB — LIPID PANEL
Cholesterol: 205 mg/dL — ABNORMAL HIGH (ref 0–200)
HDL: 112.8 mg/dL (ref >39.00)
LDL Cholesterol: 76 mg/dL (ref 0–99)
NonHDL: 91.93
Total CHOL/HDL Ratio: 2
Triglycerides: 81 mg/dL (ref 0.0–149.0)
VLDL: 16.2 mg/dL (ref 0.0–40.0)

## 2021-04-06 LAB — TSH: TSH: 3.36 u[IU]/mL (ref 0.35–4.50)

## 2021-04-06 LAB — HEMOGLOBIN A1C: Hgb A1c MFr Bld: 6.7 % — ABNORMAL HIGH (ref 4.6–6.5)

## 2021-04-06 NOTE — Patient Instructions (Signed)
Check the  blood pressure  BP GOAL is between 110/65 and  135/85. If it is consistently higher or lower, let me know     GO TO THE LAB : Get the blood work     [place], [place] back for a checkup in 6 months

## 2021-04-06 NOTE — Progress Notes (Signed)
Subjective:    Patient ID: Martin Chase, male    DOB: [birth year], 75 y.o.   MRN: [id number]  DOS:  04/06/2021 Type of visit - description: Routine visit.  He is here with his wife. Today with about hypertension, dementia, high cholesterol, Notes from Neurology reviewed.  Since the last visit, Namenda was added and subsequently prednisone was added for CIPD. According to the wife dementia is somewhat worse but she denies any acute abrupt changes, no psychosis type of symptoms.  He did have 1 fall without major consequences, [facility]   Review of Systems See above   Past Medical History:  Diagnosis Date  . Allergy   . Anemia 1996   due to a bleeding from aspirin  . Arthritis   . Cataract   . History of blood transfusion 1996  . HOH (hard of hearing)    uses hearing aids/ hearing on left side worse  . Hyperlipidemia   . Hypertension   . Prediabetes 10/20/2014  . Prostate cancer ([place])   . Sleep apnea    uses c-pap  . Spinal stenosis     Past Surgical History:  Procedure Laterality Date  . COLONOSCOPY  07/30/2019  . COLONOSCOPY WITH PROPOFOL N/A 09/16/2018   Procedure: COLONOSCOPY WITH PROPOFOL;  Surgeon: Yetta Flock, MD;  Location: [facility];  Service: Gastroenterology;  Laterality: N/A;  . POLYPECTOMY  09/16/2018   Procedure: POLYPECTOMY;  Surgeon: Yetta Flock, MD;  Location: [facility];  Service: Gastroenterology;;  . POLYPECTOMY    . SUBMUCOSAL INJECTION  09/16/2018   Procedure: SUBMUCOSAL INJECTION;  Surgeon: Yetta Flock, MD;  Location: [facility];  Service: Gastroenterology;;  . TONSILLECTOMY    . VASECTOMY      Allergies as of 04/06/2021   No Known Allergies     Medication List       Accurate as of April 06, 2021 11:59 PM. If you have any questions, ask your nurse or doctor.        atorvastatin 20 MG tablet Commonly known as: LIPITOR TAKE 1 TABLET BY MOUTH  DAILY   CALCIUM 1200 PO Take by  mouth.   donepezil 10 MG tablet Commonly known as: ARICEPT Take 1 tablet (10 mg total) by mouth at bedtime.   losartan 50 MG tablet Commonly known as: COZAAR TAKE 1 TABLET BY MOUTH  DAILY   memantine 10 MG tablet Commonly known as: NAMENDA Take 1/2 pill daily x 1 week, then 1/2 pill twice daily x 1 week, then 1 pill twice a day What changed:   how much to take  how to take this  when to take this  additional instructions   multivitamin with minerals Tabs tablet Take 1 tablet by mouth daily. Centrum   predniSONE 20 MG tablet Commonly known as: DELTASONE Take 3 tablets (60mg  total) by mouth daily   PROBIOTIC PO Take 1 Dose by mouth daily.   tamsulosin 0.4 MG Caps capsule Commonly known as: FLOMAX Take 0.4 mg by mouth daily after supper.   Vitamin B-12 5000 MCG Tbdp Take 2,500 mcg by mouth daily.          Objective:   Physical Exam BP 132/80 (BP Location: Right Arm, Patient Position: Sitting, Cuff Size: Normal)   Pulse (!) 57   Temp 98.4 F (36.9 C) (Oral)   Resp 18   Ht 5\' 11"  (1.803 m)   Wt 208 lb 4 oz (94.5 kg)   SpO2 97%  BMI 29.04 kg/m  General:   Well developed, NAD, BMI noted. HEENT:  Normocephalic . Face symmetric, atraumatic Lungs:  CTA B Normal respiratory effort, no intercostal retractions, no accessory muscle use. Heart: RRR,  no murmur.  Lower extremities: no pretibial edema bilaterally  Skin: Not pale. Not jaundice Neurologic:  alert & pleasantly demented, no formal mental exam done today Speech normal, gait: Small steps, uses a cane appropriately Psych--  Behavior appropriate. No anxious or depressed appearing.      Assessment     Assessment Prediabetes HTN ([facility]) Hyperlipidemia DJD - chronic back pain Allergies PUD per pt, dx early 2000s, related to ASA, was told not to take Prostate cancer: DX 12-2018, UroLift, finished external radiation treatment ~ 07/2019 OSA, on CPAP Dementia: 73/6681: Normal RPR, folic  acid and P94.  05-760: Brain MRI normal for age.  No acute. Neuropathy, CIPD dx ~ 02/2021    PLAN Prediabetes: Check A1c HTN:BP today is very good, ambulatory BPs are very good except for a reading in the 140s.  Continue losartan, check a CMP, CBC. Hyperlipidemia: On Lipitor, check FLP. Subclinical hypothyroidism: Check TFTs Dementia, polyneuropathy:  Last visit with neurology 02-2021, on Aricept, Namenda was added. Subsequently, because they were considering CIDP, LP was done, showed protein, he was Rx for steroids. The wife noted that he is somewhat more confused but no abrupt MS changes or maniac/psychtic type of behavior. Rec to d/w neurology. Gait disorder: Had 1 fall, fortunately he is in contact with the [place], they are providing a rolling walker, also a  new shower and toilet. Social: He is applying to move to [facility]. RTC 6 months    This visit occurred during the SARS-CoV-2 public health emergency.  Safety protocols were in place, including screening questions prior to the visit, additional usage of staff PPE, and extensive cleaning of exam room while observing appropriate contact time as indicated for disinfecting solutions.

## 2021-04-07 DIAGNOSIS — R3914 Feeling of incomplete bladder emptying: Secondary | ICD-10-CM | POA: Diagnosis not present

## 2021-04-07 DIAGNOSIS — N139 Obstructive and reflux uropathy, unspecified: Secondary | ICD-10-CM | POA: Diagnosis not present

## 2021-04-07 NOTE — Assessment & Plan Note (Signed)
Prediabetes: Check A1c HTN:BP today is very good, ambulatory BPs are very good except for a reading in the 140s.  Continue losartan, check a CMP, CBC. Hyperlipidemia: On Lipitor, check FLP. Subclinical hypothyroidism: Check TFTs Dementia, polyneuropathy:  Last visit with neurology 02-2021, on Aricept, Namenda was added. Subsequently, because they were considering CIDP, LP was done, showed protein, he was Rx for steroids. The wife noted that he is somewhat more confused but no abrupt MS changes or maniac/psychtic type of behavior. Rec to d/w neurology. Gait disorder: Had 1 fall, fortunately he is in contact with the [place], they are providing a rolling walker, also a  new shower and toilet. Social: He is applying to move to [facility]. RTC 6 months

## 2021-04-08 DIAGNOSIS — M6281 Muscle weakness (generalized): Secondary | ICD-10-CM | POA: Diagnosis not present

## 2021-04-08 DIAGNOSIS — M62552 Muscle wasting and atrophy, not elsewhere classified, left thigh: Secondary | ICD-10-CM | POA: Diagnosis not present

## 2021-04-08 DIAGNOSIS — R2681 Unsteadiness on feet: Secondary | ICD-10-CM | POA: Diagnosis not present

## 2021-04-08 DIAGNOSIS — M25552 Pain in left hip: Secondary | ICD-10-CM | POA: Diagnosis not present

## 2021-04-12 ENCOUNTER — Ambulatory Visit (INDEPENDENT_AMBULATORY_CARE_PROVIDER_SITE_OTHER): Payer: Medicare Other | Admitting: Pharmacist

## 2021-04-12 DIAGNOSIS — E785 Hyperlipidemia, unspecified: Secondary | ICD-10-CM

## 2021-04-12 DIAGNOSIS — F0390 Unspecified dementia without behavioral disturbance: Secondary | ICD-10-CM

## 2021-04-12 DIAGNOSIS — R739 Hyperglycemia, unspecified: Secondary | ICD-10-CM

## 2021-04-12 DIAGNOSIS — I10 Essential (primary) hypertension: Secondary | ICD-10-CM | POA: Diagnosis not present

## 2021-04-12 DIAGNOSIS — R269 Unspecified abnormalities of gait and mobility: Secondary | ICD-10-CM

## 2021-04-12 DIAGNOSIS — F03A0 Unspecified dementia, mild, without behavioral disturbance, psychotic disturbance, mood disturbance, and anxiety: Secondary | ICD-10-CM

## 2021-04-12 DIAGNOSIS — G6181 Chronic inflammatory demyelinating polyneuritis: Secondary | ICD-10-CM

## 2021-04-12 NOTE — Chronic Care Management (AMB) (Signed)
Chronic Care Management Pharmacy Note  04/12/2021 Name:  BELTON PEPLINSKI MRN:  [id number] DOB:  July 19, 1946  Summary: Last A1c had increased; patient is taking high dose prednisone for CIDP Starting PT tomorrow 04/13/21 Assessment for assisted living planned for this week.  Recommendations/Changes made from today's visit: [name] declined glucometer today. Discussed s/s of hyperglycemia to monitor since on prednisone for CIDP. Continue to monitor A1c and BG.    Subjective: FLOYD WADE is an 75 y.o. year old male who is a primary patient of Paz, Alda Berthold, MD.  The CCM team was consulted for assistance with disease management and care coordination needs.    Engaged with patient's wife / caregiver by telephone for follow up visit in response to provider referral for pharmacy case management and/or care coordination services.   Consent to Services:  The patient was given the following information about Chronic Care Management services today, agreed to services, and gave verbal consent: 1. CCM service includes personalized support from designated clinical staff supervised by the primary care provider, including individualized plan of care and coordination with other care providers 2. 24/7 contact phone numbers for assistance for urgent and routine care needs. 3. Service will only be billed when office clinical staff spend 20 minutes or more in a month to coordinate care. 4. Only one practitioner may furnish and bill the service in a calendar month. 5.The patient may stop CCM services at any time (effective at the end of the month) by phone call to the office staff. 6. The patient will be responsible for cost sharing (co-pay) of up to 20% of the service fee (after annual deductible is met). Patient agreed to services and consent obtained.  Patient Care Team: Colon Branch, MD as PCP - General (Internal Medicine) Berle Mull, MD as Consulting Physician (Sports Medicine) Armbruster, Carlota Raspberry,  MD as Consulting Physician (Gastroenterology) Festus Aloe, MD as Consulting Physician (Urology) Cira Rue, RN Nurse Navigator as Registered Nurse ([place]) [name], Nanine Means, MD (Neurology) Consuella Lose, MD as Consulting Physician (Neurosurgery) Cherre Robins, PharmD (Pharmacist)  Recent office visits: 04/06/2021-PCP (Dr Larose Kells) F/U chronic conditions. No medicaiton changes  Recent consult visits: 02/14/2021 Neurology (Dr [name]) Seen for CIDPS and dementia. Added Memantine 10 mg 1/2 qd for 1 week, then 1/2 bid for 1 week, then 1 tablet bid thereafter. Evlauated for CIDP by CSF check. CFS noted to have protein presents. Prednisone 57m daily stated on 03/07/2021.   Hospital visits: None in previous 6 months  Objective:  Lab Results  Component Value Date   CREATININE 1.09 04/06/2021   CREATININE 0.84 10/06/2020   CREATININE 0.90 04/01/2020    Lab Results  Component Value Date   HGBA1C 6.7 (H) 04/06/2021   Last diabetic Eye exam: No results found for: HMDIABEYEEXA  Last diabetic Foot exam: No results found for: HMDIABFOOTEX      Component Value Date/Time   CHOL 205 (H) 04/06/2021 1131   CHOL 164 11/03/2019 1044   TRIG 81.0 04/06/2021 1131   HDL 112.80 04/06/2021 1131   HDL 64 11/03/2019 1044   CHOLHDL 2 04/06/2021 1131   VLDL 16.2 04/06/2021 1131   LDLCALC 76 04/06/2021 1131   LDLCALC 79 11/03/2019 1044    Hepatic Function Latest Ref Rng & Units 04/06/2021 02/18/2021 10/06/2020  Total Protein 6.0 - 8.3 g/dL 5.9(L) - 7.0  Albumin 3.5 - 5.2 g/dL 3.8 4.5 4.2  AST 0 - 37 U/L 12 - 22  ALT 0 -  53 U/L 22 - 12  Alk Phosphatase 39 - 117 U/L 55 - 72  Total Bilirubin 0.2 - 1.2 mg/dL 0.8 - 0.6  Bilirubin, Direct 0.01 - 0.4 - - -    Lab Results  Component Value Date/Time   TSH 3.36 04/06/2021 11:31 AM   TSH 4.58 (H) 10/06/2020 01:47 PM   FREET4 0.76 04/06/2021 11:31 AM   FREET4 0.67 10/06/2020 01:47 PM    CBC Latest Ref Rng & Units 04/06/2021 11/03/2019  05/01/2019  WBC 4.0 - 10.5 K/uL 10.3 6.6 9.0  Hemoglobin 13.0 - 17.0 g/dL 13.2 12.9(L) 13.0  Hematocrit 39.0 - 52.0 % 39.5 39.0 38.6(L)  Platelets 150.0 - 400.0 K/uL 167.0 241.0 227.0    No results found for: VD25OH  Clinical ASCVD: No  The ASCVD Risk score Mikey Bussing DC Jr., et al., 2013) failed to calculate for the following reasons:   The valid HDL cholesterol range is 20 to 100 mg/dL    Other: (CHADS2VASc if Afib, PHQ9 if depression, MMRC or CAT for COPD, ACT, DEXA)  Social History   Tobacco Use  Smoking Status Former Smoker  . Packs/day: 1.00  . Years: 5.00  . Pack years: 5.00  . Types: Cigarettes  . Quit date: 01/29/1986  . Years since quitting: 35.2  Smokeless Tobacco Never Used   BP Readings from Last 3 Encounters:  04/06/21 132/80  02/14/21 (!) 127/54  02/01/21 124/64   Pulse Readings from Last 3 Encounters:  04/06/21 (!) 57  02/14/21 (!) 58  02/01/21 63   Wt Readings from Last 3 Encounters:  04/06/21 208 lb 4 oz (94.5 kg)  02/14/21 210 lb 8 oz (95.5 kg)  02/01/21 211 lb (95.7 kg)    Assessment: Review of patient past medical history, allergies, medications, health status, including review of consultants reports, laboratory and other test data, was performed as part of comprehensive evaluation and provision of chronic care management services.   SDOH:  (Social Determinants of Health) assessments and interventions performed:  SDOH Interventions   Flowsheet Row Most Recent Value  SDOH Interventions   Physical Activity Interventions Other (Comments)  [home PT to start 04/13/21]  Social Connections Interventions Other (Comment)  [Patient has dementia,  wife is his primary caregiver,  They have applied for assisted living apartment. Wife feels this would help with socialization]      CCM Care Plan  No Known Allergies  Medications Reviewed Today    Reviewed by Cherre Robins, PharmD (Pharmacist) on 04/12/21 at 97  Med List Status: <None>  Medication Order  Taking? Sig Documenting Provider Last Dose Status Informant  atorvastatin (LIPITOR) 20 MG tablet [id number] Yes TAKE 1 TABLET BY MOUTH  DAILY Colon Branch, MD Taking Active            Med Note [facility], Vilma Prader D   Tue Feb 01, 2021  2:05 PM)    Calcium Carbonate-Vit D-Min (CALCIUM 1200 PO) [id number] Yes Take by mouth. [provider] Taking Active   Cyanocobalamin (VITAMIN B-12) 5000 MCG TBDP [id number] Yes Take 2,500 mcg by mouth daily. [provider] Taking Active Self  donepezil (ARICEPT) 10 MG tablet [id number] Yes Take 1 tablet (10 mg total) by mouth at bedtime. Colon Branch, MD Taking Active   losartan (COZAAR) 50 MG tablet [id number] Yes TAKE 1 TABLET BY MOUTH  DAILY Colon Branch, MD Taking Active   memantine [facility]) 10 MG tablet [id number] Yes Take 1/2 pill daily x 1 week, then 1/2 pill twice daily x  1 week, then 1 pill twice a day  Patient taking differently: Take 10 mg by mouth 2 (two) times daily.   Sater, Nanine Means, MD Taking Active   Multiple Vitamin (MULTIVITAMIN WITH MINERALS) TABS tablet [id number] Yes Take 1 tablet by mouth daily. Centrum [provider] Taking Active Self  predniSONE (DELTASONE) 20 MG tablet [id number] Yes Take 3 tablets ($RemoveBe'60mg'WQlmGfzYd$  total) by mouth daily Sater, Nanine Means, MD Taking Active   Probiotic Product (PROBIOTIC PO) [id number] Yes Take 1 Dose by mouth daily. [provider] Taking Active   tamsulosin (FLOMAX) 0.4 MG CAPS capsule [id number] Yes Take 0.4 mg by mouth daily after supper. [provider] Taking Active   Med List Note Sharene Butters, CPhT 09/10/18 1059): CPAP MACHINE AT NIGHT.          Patient Active Problem List   Diagnosis Date Noted  . Spinal stenosis 11/09/2020  . Gait disturbance 09/21/2020  . Polyneuropathy 09/21/2020  . Memory loss 09/21/2020  . Numbness 09/21/2020  . Chronic midline low back pain 04/04/2020  . Mild dementia ([place]) 11/04/2019  . Prostate cancer ([place]) 01/06/2019  . Benign neoplasm  of colon   . Hypertension   . PCP NOTES >>>>>>>>>>>>>>>>>>>>>>>>>>> 01/31/2016  . Annual physical exam 10/20/2014  . Hyperglycemia 10/20/2014  . Osteoarthritis 01/30/2011  . Erectile dysfunction 01/30/2011  . Hyperlipidemia 08/23/2007    Immunization History  Administered Date(s) Administered  . Influenza Split 09/22/2011, 08/25/2014  . Influenza Whole 08/23/2007, 07/09/2009, 07/25/2010  . Influenza, High Dose Seasonal PF 08/03/2017, 08/05/2018, 08/06/2019, 08/06/2019, 08/05/2020  . Influenza-Unspecified 08/17/2015  . PFIZER(Purple Top)SARS-COV-2 Vaccination 11/25/2019, 12/16/2019, 08/23/2020, 12/15/2020  . Pneumococcal Conjugate-13 10/20/2014  . Pneumococcal Polysaccharide-23 07/03/2008, 08/03/2017  . Td 07/23/2009  . Zoster Recombinat (Shingrix) 07/07/2020, 09/23/2020  . Zoster, Live 03/25/2011    Conditions to be addressed/monitored: HTN, HLD, Dementia and pre DM; BPH; gait distrubance; CIDP; frequent falls  Care Plan : General Pharmacy (Adult)  Updates made by Cherre Robins, PHARMD since 04/12/2021 12:00 AM    Problem: medication and chronica care management   Priority: High  Onset Date: 04/12/2021    Long-Range Goal: Patient-Specific Goal   Start Date: 04/12/2021  This Visit's Progress: On track  Priority: High  Note:   Current Barriers:  . Unable to self administer medications as prescribed (patient's wife assists with medication administration . Chronic Disease Management support, education, and care coordination needs related to Hypertension, Hyperlipidemia, and Dementia  Pharmacist Clinical Goal(s):  [name] Over the next 180 days, patient will maintain control of HTN, hyperlipidemia and pre DM as evidenced by attaining goals below  through collaboration with PharmD and provider.  . Ensure that patient's caregiver is supported  Interventions: . 1:1 collaboration with Colon Branch, MD regarding development and update of comprehensive plan of care as evidenced by provider  attestation and co-signature . Inter-disciplinary care team collaboration (see longitudinal plan of care) . Comprehensive medication review performed; medication list updated in electronic medical record  Hypertension . Controlled; BP goal <140/90 . Home BP readings: 120 - 135 / 65 - 80 . Denies dizziness . C/O weakness thought to be related to CIDP . Current regimen:  o Losartan $RemoveBe'50mg'DhdLgdJar$  take 1 tablet daily . Interventions: o Recommended home monitoring 2 to 3 times per week; document, and provide at future appointments o Ensure daily salt intake < 2300 mg/day  Hyperlipidemia . At goal; LDL goal < 100 . Current regimen:  o Atorvastatin $RemoveBefore'20mg'mHbhzXVnsjFBR$  daily  . Interventions: o Counseled  on benefits of statin therapy o Continue current medication for cholesterol  Dementia . Pharmacist Clinical Goal(s) o Over the next 120 days, patient will work with PharmD and providers to optimize medication and manage symptoms of dementia. . [name] initially felt dementia was improved but over the last few weeks seems to be worsening . Has follow up with Dt Sater 04/17/21 . Patient and wife have applied to move into assisted living apartment at [facility]. Patient has appt for assessment 04/14/2021.  . Current regimen:  o Donepezil 28m once daily at bedtime o Menantine 157mtwice a day o B12 500012mdaily . Interventions: o Discussed medication management with wife o If not able to move to assisted living, patient's wife let me know and will refer to social worker or care coordinator to [facility] least [facility] few [facility] per week of in home care.   Gait instability / falls / chronic inflammatory demyelinating polyneuritis:  . At little improvement; no recent falls . Patient does have pre DM but last A1c was 6.7% - likely related to high dose prednisone use . No glucometer at home . Current regimen:  o Physical therapy will start tomorrow 04/13/2021 o Prednisone 60m79mtake 3 tablets  = 60mg63mly  . Interventions: o Reviewed medications for potential to increase falls.  BP and BPH medications can cause dizziness but patient denies dizziness o  Recommended continue current therapy and follow up with Dr SaterFelecia Shellingntinue with plan to start physical therapy o Continue to use walker, shower chair and lift chair.  o Discussed s/s of hyperglycemia with patient wife. She will notify office if he experiences since unable to check BP at home.  If continues to take prednisone, consider home BG monitor (patient's wife declined glucometer today)  Medication management . Current pharmacy: [facility] . Interventions o Comprehensive medication review performed. o Continue current medication management strategy  Patient Goals/Self-Care Activities . Over the next 180 days, patient will:  take medications as prescribed, check blood pressure 2 to 3 times per week, document, and provide at future appointments, and notify provider of symptom of hyperglycemia        Medication Assistance: None required.  Patient affirms current coverage meets needs.  [facility]:  [facility] [address] [place] [place] [address] [id number]: [phone]  [facility] [address]: [phone] Up:  Patient agrees to Care Plan and Follow-up.  Plan: The care management team will reach out to the patient again over the next 30 to 45 days.  Pharmacist to follow up in 90 days.  TammyCherre RobinsrmD Clinical Pharmacist [phone]

## 2021-04-12 NOTE — Patient Instructions (Signed)
Visit Information  PATIENT GOALS: Goals Addressed            This Visit's Progress   . Pharmacy Care Plan:       CARE PLAN ENTRY (see longitudinal plan of care for additional care plan information)  Current Barriers:  . Chronic Disease Management support, education, and care coordination needs related to Hypertension, Hyperlipidemia, and Dementia ; CIDP; frequency falls and gait distrubance   Hypertension BP Readings from Last 3 Encounters:  04/06/21 132/80  02/14/21 (!) 127/54  02/01/21 124/64   . Pharmacist Clinical Goal(s): o Over the next 120 days, patient will work with PharmD and providers to maintain BP goal <140/90 . Current regimen:  o Losartan 50mg  take 1 tablet daily . Interventions: o Recommended home monitoring 2 to 3 times per week . Patient self care activities - Over the next 120 days, patient will: o Check blood pressure 2 to 3 times per week, document, and provide at future appointments o Ensure daily salt intake < 2300 mg/day  Hyperlipidemia Lab Results  Component Value Date/Time   LDLCALC 76 04/06/2021 11:31 AM   LDLCALC 79 11/03/2019 10:44 AM   . Pharmacist Clinical Goal(s): o Over the next 120 days, patient will work with PharmD and providers to achieve LDL goal < 100 . Current regimen:  o Atorvastatin 20mg  daily  . Interventions: o Counseled on benefits of statin therapy . Patient self care activities - Over the next 120 days, patient will: o Continue current medication for cholesteorl   Dementia . Pharmacist Clinical Goal(s) o Over the next 120 days, patient will work with PharmD and providers to optimize medication and manage symptoms of dementia. . Current regimen:  o Donepezil 10mg  once daily at bedtime o Menantine 10mg  twice a day . Interventions: o Discussed medication management with wife . Patient self care activities - Over the next 120 days, patient will: o Continue to focus on medication adherence using pill box o If not able  to move to assisted living, please let me know and we can see if there are resources thru [place] for at least a few hours per week of in home care.   Gait instability / falls / chronic inflammatory demyelinating polyneuritis:  . Pharmacist Clinical Goal(s) o Over the next 120 days, patient will work with PharmD and providers to optimize medication and reduce falls . Current regimen:  o Physical therapy will start tomorrow 04/13/2021 o Prednisone 20mg  - take 3 tablets = 60mg  daily  . Interventions: o Reviewed medications for potential to increase falls.   . Patient self care activities - Over the next 120 days, patient will: o  Continue current therapy and follow up with Dr [name] o Continue with physical therapy o Continue to use walker, shower chair and lift chair.   Medication management . Pharmacist Clinical Goal(s): o Over the next 120 days, patient will work with PharmD and providers to maintain optimal medication adherence . Current pharmacy: [facility] . Interventions o Comprehensive medication review performed. o Continue current medication management strategy . Patient self care activities - Over the next 120 days, patient will: o Focus on medication adherence by pill box o Take medications as prescribed o Report any questions or concerns to PharmD and/or provider(s)  Please see past updates related to this goal by clicking on the "Past Updates" button in the selected goal         The patient verbalized understanding of instructions, educational materials, and care  plan provided today and declined offer to receive copy of patient instructions, educational materials, and care plan.   The care management team will reach out to the patient again over the next 30-45 days.   Pharmacist will follow up in 3 months unless needed sooner.   Cherre Robins, PharmD Clinical Pharmacist [facility] [phone]

## 2021-04-13 ENCOUNTER — Telehealth: Payer: Self-pay

## 2021-04-13 DIAGNOSIS — R2681 Unsteadiness on feet: Secondary | ICD-10-CM | POA: Diagnosis not present

## 2021-04-13 DIAGNOSIS — M62552 Muscle wasting and atrophy, not elsewhere classified, left thigh: Secondary | ICD-10-CM | POA: Diagnosis not present

## 2021-04-13 DIAGNOSIS — M6281 Muscle weakness (generalized): Secondary | ICD-10-CM | POA: Diagnosis not present

## 2021-04-13 DIAGNOSIS — M25552 Pain in left hip: Secondary | ICD-10-CM | POA: Diagnosis not present

## 2021-04-13 NOTE — Telephone Encounter (Signed)
Plan of care signed and faxed back to [facility] PT at [phone]. Form sent for scanning.

## 2021-04-15 DIAGNOSIS — R2681 Unsteadiness on feet: Secondary | ICD-10-CM | POA: Diagnosis not present

## 2021-04-15 DIAGNOSIS — M6281 Muscle weakness (generalized): Secondary | ICD-10-CM | POA: Diagnosis not present

## 2021-04-15 DIAGNOSIS — M62552 Muscle wasting and atrophy, not elsewhere classified, left thigh: Secondary | ICD-10-CM | POA: Diagnosis not present

## 2021-04-15 DIAGNOSIS — M25552 Pain in left hip: Secondary | ICD-10-CM | POA: Diagnosis not present

## 2021-04-18 ENCOUNTER — Encounter: Payer: Self-pay | Admitting: Internal Medicine

## 2021-04-18 ENCOUNTER — Ambulatory Visit (INDEPENDENT_AMBULATORY_CARE_PROVIDER_SITE_OTHER): Payer: Medicare Other | Admitting: Internal Medicine

## 2021-04-18 ENCOUNTER — Ambulatory Visit: Payer: Medicare Other | Admitting: Neurology

## 2021-04-18 ENCOUNTER — Encounter: Payer: Self-pay | Admitting: Neurology

## 2021-04-18 ENCOUNTER — Other Ambulatory Visit: Payer: Self-pay

## 2021-04-18 VITALS — BP 124/72 | HR 67 | Temp 98.1°F | Resp 16 | Ht 71.0 in | Wt 208.2 lb

## 2021-04-18 VITALS — BP 135/58 | HR 71 | Ht 71.0 in | Wt 209.5 lb

## 2021-04-18 DIAGNOSIS — R269 Unspecified abnormalities of gait and mobility: Secondary | ICD-10-CM | POA: Diagnosis not present

## 2021-04-18 DIAGNOSIS — R21 Rash and other nonspecific skin eruption: Secondary | ICD-10-CM | POA: Diagnosis not present

## 2021-04-18 DIAGNOSIS — R413 Other amnesia: Secondary | ICD-10-CM

## 2021-04-18 DIAGNOSIS — G6181 Chronic inflammatory demyelinating polyneuritis: Secondary | ICD-10-CM | POA: Insufficient documentation

## 2021-04-18 DIAGNOSIS — M4807 Spinal stenosis, lumbosacral region: Secondary | ICD-10-CM

## 2021-04-18 MED ORDER — PREDNISONE 5 MG PO TABS: ORAL_TABLET | ORAL | 0 refills | Status: DC

## 2021-04-18 NOTE — Progress Notes (Signed)
Subjective:    Patient ID: Martin Chase, male    DOB: [birth year], 75 y.o.   MRN: [id number]  DOS:  04/18/2021 Type of visit - description: Acute  Here with his wife. He developed some bilateral lower feet edema and redness at the R dorsum. This happening in the context of the patient being outside and exposed to the sun.  His physical therapist told him a couple of days ago he was concerned about cellulitis on the right foot.  They deny fever chills, no discharge.    Review of Systems See above   Past Medical History:  Diagnosis Date   Allergy    Anemia 1996   due to a bleeding from aspirin   Arthritis    Cataract    History of blood transfusion 1996   HOH (hard of hearing)    uses hearing aids/ hearing on left side worse   Hyperlipidemia    Hypertension    Prediabetes 10/20/2014   Prostate cancer ([place])    Sleep apnea    uses c-pap   Spinal stenosis     Past Surgical History:  Procedure Laterality Date   COLONOSCOPY  07/30/2019   COLONOSCOPY WITH PROPOFOL N/A 09/16/2018   Procedure: COLONOSCOPY WITH PROPOFOL;  Surgeon: Yetta Flock, MD;  Location: [facility];  Service: Gastroenterology;  Laterality: N/A;   POLYPECTOMY  09/16/2018   Procedure: POLYPECTOMY;  Surgeon: Yetta Flock, MD;  Location: [facility];  Service: Gastroenterology;;   POLYPECTOMY     SUBMUCOSAL INJECTION  09/16/2018   Procedure: SUBMUCOSAL INJECTION;  Surgeon: Yetta Flock, MD;  Location: [facility];  Service: Gastroenterology;;   TONSILLECTOMY     VASECTOMY      Allergies as of 04/18/2021   No Known Allergies      Medication List        Accurate as of April 18, 2021  8:49 AM. If you have any questions, ask your nurse or doctor.          atorvastatin 20 MG tablet Commonly known as: LIPITOR TAKE 1 TABLET BY MOUTH  DAILY   CALCIUM 1200 PO Take by mouth.   donepezil 10 MG tablet Commonly known as: ARICEPT Take 1 tablet (10 mg total) by mouth at  bedtime.   losartan 50 MG tablet Commonly known as: COZAAR TAKE 1 TABLET BY MOUTH  DAILY   memantine 10 MG tablet Commonly known as: NAMENDA Take 1/2 pill daily x 1 week, then 1/2 pill twice daily x 1 week, then 1 pill twice a day What changed:  how much to take how to take this when to take this additional instructions   multivitamin with minerals Tabs tablet Take 1 tablet by mouth daily. Centrum   predniSONE 20 MG tablet Commonly known as: DELTASONE Take 3 tablets (60mg  total) by mouth daily   PROBIOTIC PO Take 1 Dose by mouth daily.   tamsulosin 0.4 MG Caps capsule Commonly known as: FLOMAX Take 0.4 mg by mouth daily after supper.   Vitamin B-12 5000 MCG Tbdp Take 2,500 mcg by mouth daily.           Objective:   Physical Exam BP 124/72 (BP Location: Left Arm, Patient Position: Sitting, Cuff Size: Normal)   Pulse 67   Temp 98.1 F (36.7 C) (Oral)   Resp 16   Ht 5\' 11"  (1.803 m)   Wt 208 lb 4 oz (94.5 kg)   SpO2 97%   BMI 29.04 kg/m  General:  Well developed, NAD, BMI noted. HEENT:  Normocephalic . Face symmetric, atraumatic Lower extremities: Trace bilateral edema, from the ankles distally. Mild redness at the R dorsum, not warm to touch, not tender.  When he is sunbathe he uses the same sandals and only the areas exposed to the sun are red.  See picture.   Skin: Not pale. Not jaundice Neurologic:  alert & oriented X3.  Speech normal, gait appropriate for age and unassisted Psych--  Cognition and judgment appear intact.  Cooperative with normal attention span and concentration.  Behavior appropriate. No anxious or depressed appearing.       Assessment    Assessment Prediabetes HTN [facility]) Hyperlipidemia DJD - chronic back pain Allergies PUD per pt, dx early 2000s, related to ASA, was told not to take Prostate cancer: DX 12-2018, UroLift, finished external radiation treatment ~ 07/2019 OSA, on CPAP Dementia: 10/2019: Normal RPR,  folic acid and K55.  01-7481: Brain MRI normal for age.  No acute. Neuropathy, CIPD dx ~ 02/2021    PLAN Redness, R foot: Going on for a week, already getting some better without any treatment.  Suspect rash is sun related. Did recommend the patient to avoid excessive sun exposure, he could use a sunblocker. Use hydrocortisone twice daily on the red area, call if not better    This visit occurred during the SARS-CoV-2 public health emergency.  Safety protocols were in place, including screening questions prior to the visit, additional usage of staff PPE, and extensive cleaning of exam room while observing appropriate contact time as indicated for disinfecting solutions.

## 2021-04-18 NOTE — Progress Notes (Signed)
[facility]  PATIENT: Martin Chase DOB: Dec 14, 1945  REFERRING DOCTOR OR PCP: Belinda Fisher SOURCE: Patient, notes from PCP, imaging and lab reports, MRI images personally reviewed.  _________________________________   HISTORICAL  CHIEF COMPLAINT:  Chief Complaint  Patient presents with   Follow-up    RM 12 w/ wife. Last seen 02/14/21. CIDP-prednisone, tolerating well. No new sx. Ambulates with cane. Feels memory is worse. Walking is more difficult. No falls. Staggers a lot.     HISTORY OF PRESENT ILLNESS:  Martin Chase is a 75 y.o. man with gait disturbance,  episode of unresponsiveness and memory loss.  04/18/2021 He does not feel his gait is much different than the last visit.  He stumbles and holds onto the walls.   No falls.  He is using a cane.   He feels numbness in his  feet is doing the same.   He finds wiggling his toes to be harder but no major weakness.  NCV/EMG showed polyneuropathy.  He has numbness but no pain.  Lumbar puncture showed elevated CSF protein.  He was tried on prednisone but he did not get any benefit.  He feels his memory is doing about the same but his wife notes he is doing worse.   He no longer does projects around the house   He mostly sits on the patio.  He goes to PT twice a week.     He does not handle any financial tasks.    He does not drive.      He has no further episodes of being unresponsive.   Imaging studies personally reviewed: MRI of the brain 11/08/2019 showed mild generalized cortical atrophy and minimal chronic microvascular ischemic changes in the hemispheres and pons.  Mild ethmoid chronic sinusitis.  There were no acute findings.  MRI cervical spine 09/25/20 showed mild spinal stenosis at C3C4 and C6C7.   Normal spinal cord signal.   MRI lumbar 12/20/2021showed  moderately severe spinal stenosis at 2 levels (L3-L4 and L4-L5) and there are pinched nerves at these levels.  NCV/EMG 10/19/2020: This NCV/EMG study  shows the following: 1.   Moderate length dependent demyelinating and axonal polyneuropathy affecting the sensory and motor fibers more than the autonomic fibers..  There were no active features. 2.   Superimposed L3-S1 chronic radiculopathies.  This could be due to spinal stenosis.  Labs 09/21/2020:   SPEP/IEF showed polyclomal IgA elevation, HgbA1c was mildly elevated at 6.0   B12, SSA/SSB were fine.       Montreal Cognitive Assessment  02/14/2021 09/21/2020  Visuospatial/ Executive (0/5) 0 1  Naming (0/3) 3 3  Attention: Read list of digits (0/2) 1 2  Attention: Read list of letters (0/1) 0 0  Attention: Serial 7 subtraction starting at 100 (0/3) 2 3  Language: Repeat phrase (0/2) 1 2  Language : Fluency (0/1) 0 0  Abstraction (0/2) 2 1  Delayed Recall (0/5) 0 4  Orientation (0/6) 2 1  Total 11 17  Adjusted Score (based on education) 11 17     REVIEW OF SYSTEMS: Constitutional: No fevers, chills, sweats, or change in appetite Eyes: No visual changes, double vision, eye pain Ear, nose and throat: No hearing loss, ear pain, nasal congestion, sore throat Cardiovascular: No chest pain, palpitations Respiratory:  No shortness of breath at rest or with exertion.   No wheezes GastrointestinaI: No nausea, vomiting, diarrhea, abdominal pain, fecal incontinence Genitourinary:  No dysuria, urinary retention or frequency.  No nocturia. Musculoskeletal:  No neck pain, back pain Integumentary: No rash, pruritus, skin lesions Neurological: as above Psychiatric: No depression at this time.  No anxiety Endocrine: No palpitations, diaphoresis, change in appetite, change in weigh or increased thirst Hematologic/Lymphatic:  No anemia, purpura, petechiae. Allergic/Immunologic: No itchy/runny eyes, nasal congestion, recent allergic reactions, rashes  ALLERGIES: No Known Allergies  HOME MEDICATIONS:  Current Outpatient Medications:    atorvastatin (LIPITOR) 20 MG tablet, TAKE 1 TABLET BY  MOUTH  DAILY, Disp: 90 tablet, Rfl: 3   Calcium Carbonate-Vit D-Min (CALCIUM 1200 PO), Take by mouth., Disp: , Rfl:    Cyanocobalamin (VITAMIN B-12) 5000 MCG TBDP, Take 2,500 mcg by mouth daily., Disp: , Rfl:    donepezil (ARICEPT) 10 MG tablet, Take 1 tablet (10 mg total) by mouth at bedtime., Disp: 90 tablet, Rfl: 3   losartan (COZAAR) 50 MG tablet, TAKE 1 TABLET BY MOUTH  DAILY, Disp: 90 tablet, Rfl: 3   memantine (NAMENDA) 10 MG tablet, Take 1/2 pill daily x 1 week, then 1/2 pill twice daily x 1 week, then 1 pill twice a day (Patient taking differently: Take 10 mg by mouth 2 (two) times daily.), Disp: 60 tablet, Rfl: 11   Multiple Vitamin (MULTIVITAMIN WITH MINERALS) TABS tablet, Take 1 tablet by mouth daily. Centrum, Disp: , Rfl:    predniSONE (DELTASONE) 5 MG tablet, Use as directed, Disp: 90 tablet, Rfl: 0   Probiotic Product (PROBIOTIC PO), Take 1 Dose by mouth daily., Disp: , Rfl:    tamsulosin (FLOMAX) 0.4 MG CAPS capsule, Take 0.4 mg by mouth daily after supper., Disp: , Rfl:   PAST MEDICAL HISTORY: Past Medical History:  Diagnosis Date   Allergy    Anemia 1996   due to a bleeding from aspirin   Arthritis    Cataract    History of blood transfusion 1996   HOH (hard of hearing)    uses hearing aids/ hearing on left side worse   Hyperlipidemia    Hypertension    Prediabetes 10/20/2014   Prostate cancer ([place])    Sleep apnea    uses c-pap   Spinal stenosis     PAST SURGICAL HISTORY: Past Surgical History:  Procedure Laterality Date   COLONOSCOPY  07/30/2019   COLONOSCOPY WITH PROPOFOL N/A 09/16/2018   Procedure: COLONOSCOPY WITH PROPOFOL;  Surgeon: Yetta Flock, MD;  Location: [facility];  Service: Gastroenterology;  Laterality: N/A;   POLYPECTOMY  09/16/2018   Procedure: POLYPECTOMY;  Surgeon: Yetta Flock, MD;  Location: [facility];  Service: Gastroenterology;;   POLYPECTOMY     SUBMUCOSAL INJECTION  09/16/2018   Procedure: SUBMUCOSAL INJECTION;   Surgeon: Yetta Flock, MD;  Location: [facility];  Service: Gastroenterology;;   TONSILLECTOMY     VASECTOMY      FAMILY HISTORY: Family History  Problem Relation Age of Onset   Diabetes Mother    Alzheimer's disease Mother    Prostate cancer Father 28       prostatectomy?   Diabetes Maternal Uncle    Stroke Paternal Uncle    Diabetes Maternal Grandmother    Heart disease Maternal Grandfather    Suicidality Brother    Colon cancer Neg Hx    Rectal cancer Neg Hx    Esophageal cancer Neg Hx    Stomach cancer Neg Hx    Colon polyps Neg Hx     SOCIAL HISTORY:  Social History   Socioeconomic History   Marital status: Married    Spouse name: Marketing executive  Number of children: 0   Years of education: college   Highest education level: Not on file  Occupational History   Occupation: retired, [employer]  Tobacco Use   Smoking status: Former    Packs/day: 1.00    Years: 5.00    Pack years: 5.00    Types: Cigarettes    Quit date: 01/29/1986    Years since quitting: 35.2   Smokeless tobacco: Never  Vaping Use   Vaping Use: Never used  Substance and Sexual Activity   Alcohol use: Yes    Alcohol/week: 7.0 standard drinks    Types: 7 Glasses of wine per week    Comment: 1 glass of wine with dinner   Drug use: No   Sexual activity: Not Currently  Other Topics Concern   Not on file  Social History Narrative   Lives with wife   Household -- pt and wife   Caffeine use: 2 cups coffee per day   Right handed    Social Determinants of Health   Financial Resource Strain: Low Risk    Difficulty of Paying Living Expenses: Not very hard  Food Insecurity: Not on file  Transportation Needs: Not on file  Physical Activity: Insufficiently Active   Days of Exercise per Week: 1 day   Minutes of Exercise per Session: 20 min  Stress: Not on file  Social Connections: Unknown   Frequency of Communication with Friends and Family: Once a week   Frequency of Social  Gatherings with Friends and Family: Once a week   Attends Religious Services: Patient refused   Marine scientist or Organizations: No   Attends Archivist Meetings: Never   Marital Status: Married  Human resources officer Violence: Not on file     PHYSICAL EXAM  Vitals:   04/18/21 1403  BP: (!) 135/58  Pulse: 71  Weight: 209 lb 8 oz (95 kg)  Height: 5\' 11"  (1.803 m)    Body mass index is 29.22 kg/m.   General: The patient is well-developed and well-nourished and in no acute distress  HEENT:  Head is Zeb/AT.  Sclera are anicteric.   Skin: Extremities are without rash or  edema.  Neurologic Exam  Mental status: The patient is alert and oriented x 2 (3/6 on orientation questions) at the time of the examination. The patient has reduced short-term memory.  Montreal cognitive assessment score was 11/30 consistent with  moderate dementia (worse),      Executive function was reduced.  Speech is normal.  Cranial nerves: Extraocular movements are full. Pupils are equal, round, and reactive to light and accomodation.  Normal facial strength/sensation. l. No dysarthria is noted.  Reduced hearing L worse then R noted.  Motor:  Muscle bulk is normal.   Tone is normal. Strength is  5 / 5 in all 4 extremities except 4/5 EHL and 4+/5 ankle extensors.    Sensory: Sensory testing is intact to pinprick, soft touch and vibration sensation in the arms but reduced pinprick at ankles .   Reduced vibration at ankles and very reduced at toes, normal at knees..  Coordination: Cerebellar testing reveals good finger-nose-finger and heel-to-shin bilaterally.  Gait and station: Station is normal.   Gait has mildly reduced stride and is mildly wide.  He is stooped forward.  He looks down while he walks, even with a cane.   He turns 180 degrees in 3-4 steps.   Has only mild retropulsion.   Poor tandem . Romberg is  negative.   Reflexes: Deep tendon reflexes are symmetric and normal in arms, 2 at the  knees and absent at ankles.       DIAGNOSTIC DATA (LABS, IMAGING, TESTING) - I reviewed patient records, labs, notes, testing and imaging myself where available.  Lab Results  Component Value Date   WBC 10.3 04/06/2021   HGB 13.2 04/06/2021   HCT 39.5 04/06/2021   MCV 91.6 04/06/2021   PLT 167.0 04/06/2021      Component Value Date/Time   NA 141 04/06/2021 1131   NA 139 01/02/2018 0000   K 4.9 04/06/2021 1131   CL 102 04/06/2021 1131   CO2 32 04/06/2021 1131   GLUCOSE 105 (H) 04/06/2021 1131   BUN 31 (H) 04/06/2021 1131   BUN 21 01/02/2018 0000   CREATININE 1.09 04/06/2021 1131   CALCIUM 9.5 04/06/2021 1131   PROT 5.9 (L) 04/06/2021 1131   PROT 7.2 09/21/2020 1157   ALBUMIN 3.8 04/06/2021 1131   ALBUMIN 4.5 02/18/2021 1150   AST 12 04/06/2021 1131   ALT 22 04/06/2021 1131   ALKPHOS 55 04/06/2021 1131   BILITOT 0.8 04/06/2021 1131   GFRNONAA 110.80 07/25/2010 0933   GFRAA 110 06/26/2008 0948   Lab Results  Component Value Date   CHOL 205 (H) 04/06/2021   HDL 112.80 04/06/2021   LDLCALC 76 04/06/2021   TRIG 81.0 04/06/2021   CHOLHDL 2 04/06/2021   Lab Results  Component Value Date   HGBA1C 6.7 (H) 04/06/2021   Lab Results  Component Value Date   VITAMINB12 >2000 (H) 09/21/2020   Lab Results  Component Value Date   TSH 3.36 04/06/2021       ASSESSMENT AND PLAN  Chronic inflammatory demyelinating polyneuritis (HCC)  Memory loss  Spinal stenosis of lumbosacral region  Gait disturbance   1.   Memory issues are likely Alzheimer's disease.  Continue donepezil and memantine.  They will add folic acid.  We discussed trying to increase physical activity. 2.   Difficulties with his gait is probably due to both the demyelinating > axonal polyneuropathy and the spinal stenosis.  He had elevated protein in the CSF and also has an IgA polyclonal elevation.  He did not receive any benefit from prednisone so we will taper this back off.   3.   Use walker  outside of home for safety.   Continue shower seat.  Discussed safety around his home due to polyneuropathy. 4.   rtc 12 months or sooner if new or worsening issues.     Gianna Calef A. [name], MD, [facility] 7/89/3810, 1:75 PM Certified in Neurology, Clinical Neurophysiology, Sleep Medicine and Neuroimaging  [facility] [address] [place], [place] [address] [phone]

## 2021-04-18 NOTE — Patient Instructions (Signed)
Keep the legs elevated  Get over-the-counter  hydrocortisone 1% cream: Apply twice daily  Call if not gradually better or if you have fever chills

## 2021-04-19 NOTE — Assessment & Plan Note (Signed)
Redness, R foot: Going on for a week, already getting some better without any treatment.  Suspect rash is sun related. Did recommend the patient to avoid excessive sun exposure, he could use a sunblocker. Use hydrocortisone twice daily on the red area, call if not better

## 2021-04-20 DIAGNOSIS — M6281 Muscle weakness (generalized): Secondary | ICD-10-CM | POA: Diagnosis not present

## 2021-04-20 DIAGNOSIS — R2681 Unsteadiness on feet: Secondary | ICD-10-CM | POA: Diagnosis not present

## 2021-04-20 DIAGNOSIS — M62552 Muscle wasting and atrophy, not elsewhere classified, left thigh: Secondary | ICD-10-CM | POA: Diagnosis not present

## 2021-04-20 DIAGNOSIS — M25552 Pain in left hip: Secondary | ICD-10-CM | POA: Diagnosis not present

## 2021-04-21 ENCOUNTER — Encounter: Payer: Self-pay | Admitting: Internal Medicine

## 2021-04-22 DIAGNOSIS — M6281 Muscle weakness (generalized): Secondary | ICD-10-CM | POA: Diagnosis not present

## 2021-04-22 DIAGNOSIS — M62552 Muscle wasting and atrophy, not elsewhere classified, left thigh: Secondary | ICD-10-CM | POA: Diagnosis not present

## 2021-04-22 DIAGNOSIS — R2681 Unsteadiness on feet: Secondary | ICD-10-CM | POA: Diagnosis not present

## 2021-04-22 DIAGNOSIS — M25552 Pain in left hip: Secondary | ICD-10-CM | POA: Diagnosis not present

## 2021-04-25 ENCOUNTER — Telehealth: Payer: Self-pay | Admitting: Neurology

## 2021-04-25 MED ORDER — MEMANTINE HCL 10 MG PO TABS: 10.0000 mg | ORAL_TABLET | Freq: Two times a day (BID) | ORAL | 2 refills | Status: DC

## 2021-04-25 NOTE — Telephone Encounter (Signed)
Pt;s wife, Shun Pletz request refill Memantine 10 mg at [facility]

## 2021-04-25 NOTE — Telephone Encounter (Signed)
Refill sent.

## 2021-04-28 ENCOUNTER — Other Ambulatory Visit: Payer: Self-pay | Admitting: Internal Medicine

## 2021-05-05 MED ORDER — MEMANTINE HCL 10 MG PO TABS: 10.0000 mg | ORAL_TABLET | Freq: Two times a day (BID) | ORAL | 2 refills | Status: DC

## 2021-05-05 NOTE — Telephone Encounter (Signed)
Pt's wife is asking for a call from [place], RN to discuss the request for the refill of memantine (NAMENDA) 10 MG tablet to [facility] .  Pt's wife states she reached out to [facility] and was told they are unable to process, please call.

## 2021-05-05 NOTE — Addendum Note (Signed)
Addended by: Verlin Grills on: 05/05/2021 03:08 PM   Modules accepted: Orders

## 2021-05-05 NOTE — Telephone Encounter (Signed)
Pt's wife called back and she states after she called our office, she called [facility] back and was advised by the pharmacist med refill had been received and is scheduled to be shipped out.  Nothing further needed at this time.

## 2021-05-11 DIAGNOSIS — R2681 Unsteadiness on feet: Secondary | ICD-10-CM | POA: Diagnosis not present

## 2021-05-11 DIAGNOSIS — M25552 Pain in left hip: Secondary | ICD-10-CM | POA: Diagnosis not present

## 2021-05-11 DIAGNOSIS — M6281 Muscle weakness (generalized): Secondary | ICD-10-CM | POA: Diagnosis not present

## 2021-05-11 DIAGNOSIS — M62552 Muscle wasting and atrophy, not elsewhere classified, left thigh: Secondary | ICD-10-CM | POA: Diagnosis not present

## 2021-05-12 ENCOUNTER — Telehealth: Payer: Self-pay

## 2021-05-12 ENCOUNTER — Telehealth: Payer: Self-pay | Admitting: Pharmacist

## 2021-05-12 DIAGNOSIS — M25552 Pain in left hip: Secondary | ICD-10-CM | POA: Diagnosis not present

## 2021-05-12 DIAGNOSIS — M62552 Muscle wasting and atrophy, not elsewhere classified, left thigh: Secondary | ICD-10-CM | POA: Diagnosis not present

## 2021-05-12 DIAGNOSIS — R2681 Unsteadiness on feet: Secondary | ICD-10-CM | POA: Diagnosis not present

## 2021-05-12 DIAGNOSIS — M6281 Muscle weakness (generalized): Secondary | ICD-10-CM | POA: Diagnosis not present

## 2021-05-12 NOTE — Chronic Care Management (AMB) (Signed)
    Chronic Care Management Pharmacy Assistant   Name: Martin Chase  MRN: [id number] DOB: [birth year]  Reason for Encounter: Disease State General   Recent office visits:  04/18/21 [name] MD(PCP)- Patient seen for rash. Patient advised to use Hydrocortisone 1%. Follow up as needed.  Recent consult visits:  04/18/21 [name] MD(Neurology)- Seen for follow up. Patient was started on Folic acid and taken off Prednisone. Return to clinic in 12 months.  Hospital visits:  None in previous 6 months  Medications: Outpatient Encounter Medications as of 05/12/2021  Medication Sig   atorvastatin (LIPITOR) 20 MG tablet TAKE 1 TABLET BY MOUTH  DAILY   Calcium Carbonate-Vit D-Min (CALCIUM 1200 PO) Take by mouth.   Cyanocobalamin (VITAMIN B-12) 5000 MCG TBDP Take 2,500 mcg by mouth daily.   donepezil (ARICEPT) 10 MG tablet Take 1 tablet (10 mg total) by mouth at bedtime.   losartan (COZAAR) 50 MG tablet TAKE 1 TABLET BY MOUTH  DAILY   memantine (NAMENDA) 10 MG tablet Take 1 tablet (10 mg total) by mouth 2 (two) times daily.   Multiple Vitamin (MULTIVITAMIN WITH MINERALS) TABS tablet Take 1 tablet by mouth daily. Centrum   predniSONE (DELTASONE) 5 MG tablet Use as directed   Probiotic Product (PROBIOTIC PO) Take 1 Dose by mouth daily.   tamsulosin (FLOMAX) 0.4 MG CAPS capsule Take 0.4 mg by mouth daily after supper.   No facility-administered encounter medications on file as of 05/12/2021.   Have you had any problems recently with your health? Patients wife states he can barely walk. He has an appointment with physical therapy today. PT asked if he had been checked for parkinson. PT recommends being evaluated for parkinson at [name]. Walking has declined per patients wife.  Have you had any problems with your pharmacy? Patients wife states he is not having any problems with his pharmacy.  What issues or side effects are you having with your medications? Patients wife states he has trouble  swallowing. She states she knows one of the side effects listed for Mementine is trouble swallowing.  What would you like me to pass along to [employer], CPP for them to help you with?  Patients wife states she would like a second opinion for evaluation for parkinson's.  What can we do to take care of you better?  Patient states there is nothing at this time.  Star Rating Drugs:  Atorvastatin 20 Mg last filled 04/01/21 90 DS Losartan 50 Mg last filled 04/01/21 90 DS  [facility] Clinical Pharmacist Assistant [phone]

## 2021-05-12 NOTE — Telephone Encounter (Signed)
Plan of care signed and faxed back to [facility] at [phone]. Form sent for scanning.

## 2021-05-19 DIAGNOSIS — R2681 Unsteadiness on feet: Secondary | ICD-10-CM | POA: Diagnosis not present

## 2021-05-19 DIAGNOSIS — M6281 Muscle weakness (generalized): Secondary | ICD-10-CM | POA: Diagnosis not present

## 2021-05-19 DIAGNOSIS — M25552 Pain in left hip: Secondary | ICD-10-CM | POA: Diagnosis not present

## 2021-05-19 DIAGNOSIS — M62552 Muscle wasting and atrophy, not elsewhere classified, left thigh: Secondary | ICD-10-CM | POA: Diagnosis not present

## 2021-05-23 NOTE — Telephone Encounter (Signed)
Patient wife stated that she will find a particular provider within [place] and she will call us back with information so we can place referral.

## 2021-05-23 NOTE — Telephone Encounter (Signed)
Review phone notes and notices patient wife request for second opinion from neurology regarding patient gait instability and other neuro change (re-evaluate for Parkinson's).  Will forward to PCP for review and recommendations.

## 2021-05-23 NOTE — Telephone Encounter (Signed)
Okay to arrange neurology referral to [facility] for a second opinion regards gait disorder and neuropathy.

## 2021-05-26 ENCOUNTER — Other Ambulatory Visit: Payer: Self-pay | Admitting: Internal Medicine

## 2021-06-01 DIAGNOSIS — M25552 Pain in left hip: Secondary | ICD-10-CM | POA: Diagnosis not present

## 2021-06-01 DIAGNOSIS — M62552 Muscle wasting and atrophy, not elsewhere classified, left thigh: Secondary | ICD-10-CM | POA: Diagnosis not present

## 2021-06-01 DIAGNOSIS — M6281 Muscle weakness (generalized): Secondary | ICD-10-CM | POA: Diagnosis not present

## 2021-06-01 DIAGNOSIS — R2681 Unsteadiness on feet: Secondary | ICD-10-CM | POA: Diagnosis not present

## 2021-06-24 DIAGNOSIS — M6281 Muscle weakness (generalized): Secondary | ICD-10-CM | POA: Diagnosis not present

## 2021-06-24 DIAGNOSIS — R2681 Unsteadiness on feet: Secondary | ICD-10-CM | POA: Diagnosis not present

## 2021-06-24 DIAGNOSIS — M62552 Muscle wasting and atrophy, not elsewhere classified, left thigh: Secondary | ICD-10-CM | POA: Diagnosis not present

## 2021-06-24 DIAGNOSIS — M25552 Pain in left hip: Secondary | ICD-10-CM | POA: Diagnosis not present

## 2021-06-27 ENCOUNTER — Ambulatory Visit: Payer: Medicare Other | Admitting: Neurology

## 2021-06-28 ENCOUNTER — Telehealth: Payer: Self-pay

## 2021-06-28 NOTE — Telephone Encounter (Signed)
Plan of care signed and faxed back to [facility] at [phone]. Form sent for scanning.

## 2021-07-05 ENCOUNTER — Ambulatory Visit (INDEPENDENT_AMBULATORY_CARE_PROVIDER_SITE_OTHER): Payer: Medicare Other | Admitting: Pharmacist

## 2021-07-05 DIAGNOSIS — E785 Hyperlipidemia, unspecified: Secondary | ICD-10-CM | POA: Diagnosis not present

## 2021-07-05 DIAGNOSIS — F0390 Unspecified dementia without behavioral disturbance: Secondary | ICD-10-CM | POA: Diagnosis not present

## 2021-07-05 DIAGNOSIS — F03A0 Unspecified dementia, mild, without behavioral disturbance, psychotic disturbance, mood disturbance, and anxiety: Secondary | ICD-10-CM

## 2021-07-05 DIAGNOSIS — I10 Essential (primary) hypertension: Secondary | ICD-10-CM | POA: Diagnosis not present

## 2021-07-05 DIAGNOSIS — R269 Unspecified abnormalities of gait and mobility: Secondary | ICD-10-CM

## 2021-07-05 NOTE — Chronic Care Management (AMB) (Signed)
Chronic Care Management Pharmacy Note  07/06/2021 Name:  Martin Chase MRN:  [id number] DOB:  1945-12-02  Summary: Last A1c had increased; patient was taking high dose prednisone for CIDP but has stopped prednisone since was not seeing effect Nurse from [facility] visit recently and mentioned PVD. Wife was wondering if patient has this.Especially since he is having LEE.   Recommendations/Changes made from today's visit: Patient will see [place] physician tomorrow - suggested they ask they see need to do ankle-brachial index or other assessment for PVD.  Updated med list since prednisone stopped.   Subjective: Martin Chase is an 75 y.o. year old male who is a primary patient of Paz, Alda Berthold, MD.  The CCM team was consulted for assistance with disease management and care coordination needs.    Engaged with patient's wife / caregiver by telephone for follow up visit in response to provider referral for pharmacy case management and/or care coordination services.   Consent to Services:  The patient was given information about Chronic Care Management services, agreed to services, and gave verbal consent prior to initiation of services.  Please see initial visit note for detailed documentation.   Patient Care Team: Colon Branch, MD as PCP - General (Internal Medicine) Berle Mull, MD as Consulting Physician (Sports Medicine) Armbruster, Carlota Raspberry, MD as Consulting Physician (Gastroenterology) Festus Aloe, MD as Consulting Physician (Urology) Cira Rue, RN Nurse Navigator as Registered Nurse (Medical Oncology) [name], Nanine Means, MD (Neurology) Consuella Lose, MD as Consulting Physician (Neurosurgery) Cherre Robins, PharmD (Pharmacist)  Recent office visits: 04/18/21 [name] MD(PCP)- Patient seen for rash. Patient advised to use Hydrocortisone 1%. Follow up as needed. 04/06/2021-PCP (Dr Larose Kells) F/U chronic conditions. No medicaiton changes  Recent consult visits: 04/18/21 [name]  MD(Neurology)- Seen for follow up. Patient was started on Folic acid and taken off Prednisone. Return to clinic in 12 months 02/14/2021 Neurology (Dr [name]) Seen for CIDPS and dementia. Added Memantine 10 mg 1/2 qd for 1 week, then 1/2 bid for 1 week, then 1 tablet bid thereafter. Evlauated for CIDP by CSF check. CFS noted to have protein presents. Prednisone 65m daily stated on 03/07/2021.   Hospital visits: None in previous 6 months  Objective:  Lab Results  Component Value Date   CREATININE 1.09 04/06/2021   CREATININE 0.84 10/06/2020   CREATININE 0.90 04/01/2020    Lab Results  Component Value Date   HGBA1C 6.7 (H) 04/06/2021   Last diabetic Eye exam: No results found for: HMDIABEYEEXA  Last diabetic Foot exam: No results found for: HMDIABFOOTEX      Component Value Date/Time   CHOL 205 (H) 04/06/2021 1131   CHOL 164 11/03/2019 1044   TRIG 81.0 04/06/2021 1131   HDL 112.80 04/06/2021 1131   HDL 64 11/03/2019 1044   CHOLHDL 2 04/06/2021 1131   VLDL 16.2 04/06/2021 1131   LDLCALC 76 04/06/2021 1131   LDLCALC 79 11/03/2019 1044    Hepatic Function Latest Ref Rng & Units 04/06/2021 02/18/2021 10/06/2020  Total Protein 6.0 - 8.3 g/dL 5.9(L) - 7.0  Albumin 3.5 - 5.2 g/dL 3.8 4.5 4.2  AST 0 - 37 U/L 12 - 22  ALT 0 - 53 U/L 22 - 12  Alk Phosphatase 39 - 117 U/L 55 - 72  Total Bilirubin 0.2 - 1.2 mg/dL 0.8 - 0.6  Bilirubin, Direct 0.01 - 0.4 - - -    Lab Results  Component Value Date/Time   TSH 3.36 04/06/2021 11:31 AM  TSH 4.58 (H) 10/06/2020 01:47 PM   FREET4 0.76 04/06/2021 11:31 AM   FREET4 0.67 10/06/2020 01:47 PM    CBC Latest Ref Rng & Units 04/06/2021 11/03/2019 05/01/2019  WBC 4.0 - 10.5 K/uL 10.3 6.6 9.0  Hemoglobin 13.0 - 17.0 g/dL 13.2 12.9(L) 13.0  Hematocrit 39.0 - 52.0 % 39.5 39.0 38.6(L)  Platelets 150.0 - 400.0 K/uL 167.0 241.0 227.0    No results found for: VD25OH  Clinical ASCVD: No  The ASCVD Risk score Mikey Bussing DC Jr., et al., 2013) failed to  calculate for the following reasons:   The valid HDL cholesterol range is 20 to 100 mg/dL     Social History   Tobacco Use  Smoking Status Former   Packs/day: 1.00   Years: 5.00   Pack years: 5.00   Types: Cigarettes   Quit date: 01/29/1986   Years since quitting: 35.4  Smokeless Tobacco Never   BP Readings from Last 3 Encounters:  04/18/21 (!) 135/58  04/18/21 124/72  04/06/21 132/80   Pulse Readings from Last 3 Encounters:  04/18/21 71  04/18/21 67  04/06/21 (!) 57   Wt Readings from Last 3 Encounters:  04/18/21 209 lb 8 oz (95 kg)  04/18/21 208 lb 4 oz (94.5 kg)  04/06/21 208 lb 4 oz (94.5 kg)    Assessment: Review of patient past medical history, allergies, medications, health status, including review of consultants reports, laboratory and other test data, was performed as part of comprehensive evaluation and provision of chronic care management services.   SDOH:  (Social Determinants of Health) assessments and interventions performed:  SDOH Interventions    Flowsheet Row Most Recent Value  SDOH Interventions   Food Insecurity Interventions Intervention Not Indicated        CCM Care Plan  No Known Allergies  Medications Reviewed Today     Reviewed by Cherre Robins, PharmD (Pharmacist) on 07/05/21 at 15  Med List Status: <None>   Medication Order Taking? Sig Documenting Provider Last Dose Status Informant  atorvastatin (LIPITOR) 20 MG tablet [id number] Yes TAKE 1 TABLET BY MOUTH  DAILY Colon Branch, MD Taking Active   Calcium Carbonate-Vit D-Min (CALCIUM 1200 PO) [id number] Yes Take by mouth. [provider] Taking Active   Cyanocobalamin (VITAMIN B-12) 5000 MCG TBDP [id number] Yes Take 2,500 mcg by mouth daily. [provider] Taking Active Self  donepezil (ARICEPT) 10 MG tablet [id number] Yes Take 1 tablet (10 mg total) by mouth at bedtime. Colon Branch, MD Taking Active   losartan (COZAAR) 50 MG tablet [id number] Yes TAKE 1 TABLET BY MOUTH   DAILY Colon Branch, MD Taking Active   memantine [facility]) 10 MG tablet [id number] Yes Take 1 tablet (10 mg total) by mouth 2 (two) times daily. Sater, Nanine Means, MD Taking Active   Multiple Vitamin (MULTIVITAMIN WITH MINERALS) TABS tablet [id number] Yes Take 1 tablet by mouth daily. Centrum [provider] Taking Active Self  Probiotic Product (PROBIOTIC PO) [id number] Yes Take 1 Dose by mouth daily. [provider] Taking Active   tamsulosin (FLOMAX) 0.4 MG CAPS capsule [id number] Yes Take 0.4 mg by mouth daily after supper. [provider] Taking Active   Med List Note Sharene Butters, CPhT 09/10/18 1059): CPAP MACHINE AT NIGHT.            Patient Active Problem List   Diagnosis Date Noted   Chronic inflammatory demyelinating polyneuritis ([place]) 04/18/2021   Spinal stenosis 11/09/2020   Gait disturbance 09/21/2020  Polyneuropathy 09/21/2020   Memory loss 09/21/2020   Numbness 09/21/2020   Chronic midline low back pain 04/04/2020   Mild dementia ([place]) 11/04/2019   Prostate cancer ([place]) 01/06/2019   Benign neoplasm of colon    Hypertension    PCP NOTES >>>>>>>>>>>>>>>>>>>>>>>>>>> 01/31/2016   Annual physical exam 10/20/2014   Hyperglycemia 10/20/2014   Osteoarthritis 01/30/2011   Erectile dysfunction 01/30/2011   Hyperlipidemia 08/23/2007    Immunization History  Administered Date(s) Administered   Influenza Split 09/22/2011, 08/25/2014   Influenza Whole 08/23/2007, 07/09/2009, 07/25/2010   Influenza, High Dose Seasonal PF 08/03/2017, 08/05/2018, 08/06/2019, 08/06/2019, 08/05/2020   Influenza-Unspecified 08/17/2015   PFIZER(Purple Top)SARS-COV-2 Vaccination 11/25/2019, 12/16/2019, 08/23/2020, 12/15/2020   Pneumococcal Conjugate-13 10/20/2014   Pneumococcal Polysaccharide-23 07/03/2008, 08/03/2017   Td 07/23/2009   Zoster Recombinat (Shingrix) 07/07/2020, 09/23/2020   Zoster, Live 03/25/2011    Conditions to be addressed/monitored: HTN, HLD,  Dementia and pre DM; BPH; gait distrubance; CIDP; frequent falls  Care Plan : General Pharmacy (Adult)  Updates made by Cherre Robins, PHARMD since 07/06/2021 12:00 AM     Problem: medication and chronica care management   Priority: High  Onset Date: 04/12/2021     Long-Range Goal: Patient-Specific Goal   Start Date: 04/12/2021  This Visit's Progress: On track  Recent Progress: On track  Priority: High  Note:   Current Barriers:  Unable to self administer medications as prescribed (patient's wife assists with medication administration Chronic Disease Management support, education, and care coordination needs related to Hypertension, Hyperlipidemia, and Dementia  Pharmacist Clinical Goal(s):  Over the next 180 days, patient will maintain control of HTN, hyperlipidemia and pre DM as evidenced by attaining goals below  through collaboration with PharmD and provider.  Ensure that patient's caregiver is supported  Interventions: 1:1 collaboration with Colon Branch, MD regarding development and update of comprehensive plan of care as evidenced by provider attestation and co-signature Inter-disciplinary care team collaboration (see longitudinal plan of care) Comprehensive medication review performed; medication list updated in electronic medical record  Hypertension Controlled; BP goal <140/90 Home BP readings:  102/53; 130/61; 126/64; 112/57 Denies dizziness Occasional weakness thought to be related to CIDP Current regimen:  Losartan 24m take 1 tablet daily Interventions: Recommended home monitoring 2 to 3 times per week; document, and provide at future appointments Ensure daily salt intake < 2300 mg/day  Edema:  [name] reports lower extremity edema with "shiny skin" in his lower legs. Denies weeping.  Denies SOB Patient sits a lot during the day Current regimen for edema: none Interventions:  [facility] Recommend ask about swelling Until  then recommended he elevated foot 2 to 3 times per day to help with swelling Limit daily salt intake < 2300 mg/day  Hyperlipidemia At goal; LDL goal < 100 Current regimen:  Atorvastatin 269mdaily  Interventions: Counseled on benefits of statin therapy Continue current medication for cholesterol  Dementia Stable; Goal: optimize medication and manage symptoms of dementia. [name] reports today that memory is stable Has follow up with Dt Sater 04/17/21 Patient [facility] Anticipated move in date though is not expected until sometime in 2023.  During call with CCM medical assistance, wife has mentioned that patient was having difficulty swallowing memantine but today she states he has not had any problems with swallowing medications or food in the last month.  Current regimen:  Donepezil 1035mnce daily  at bedtime Menantine 45m twice a day Vitamin B12 50077m daily Interventions: Discussed medication management with wife Continue current therapy for dementia  Gait instability / falls / chronic inflammatory demyelinating polyneuritis:  A little improvement; no recent falls Patient does have pre DM but last A1c was 6.7% - likely related to high dose prednisone use.  Patient did not see improvement with prednisone has been stopped.  [name] still interested in getting second opinion about Parkinson's Disease / movement disorder.  Current regimen:  Physical therapy  Interventions: Reviewed medications for potential to increase falls.  BP and BPH medications can cause dizziness but patient denies dizziness  Recommended continue current therapy and follow up with Dr SaFelecia Shellingontinue physical therapy Continue to use walker, shower chair and lift chair.  Provided number for [facility]   Medication management Current pharmacy: [facility] order Interventions Comprehensive medication  review performed. Continue current medication management strategy  Patient Goals/Self-Care Activities Over the next 180 days, patient will:  take medications as prescribed, check blood pressure 2 to 3 times per week, document, and provide at future appointments, and notify provider of symptom of hyperglycemia         Medication Assistance: None required.  Patient affirms current coverage meets needs.  [place] [address] [place]: [phone]: [phone]  ([facility] [place] [place]: [phone]: [phone] Follow Up:  Patient agrees to Care Plan and Follow-up.  Plan: The care management team will reach out to the patient again over the next 30 to 45 days.  Pharmacist to follow up in 90 days.  TaCherre RobinsPharmD Clinical Pharmacist [facility] [phone] 07/06/2021:  Patients [facility] physician recommended trial of furosemide 2015maily for 3 day, then as needed thereafter for LEE. Also recommended he elevate feet during day to reduce LEE.

## 2021-07-06 NOTE — Patient Instructions (Signed)
Visit Information  PATIENT GOALS:  Goals Addressed             This Visit's Progress    Pharmacy Care Plan:   On track    [place] (see longitudinal plan of care for additional care plan information)  Current Barriers:  Chronic Disease Management support, education, and care coordination needs related to Hypertension, Hyperlipidemia, and Dementia ; CIDP; frequency falls and gait distrubance   Hypertension BP Readings from Last 3 Encounters:  04/18/21 (!) 135/58  04/18/21 124/72  04/06/21 132/80  Pharmacist Clinical Goal(s): Over the next 120 days, patient will work with PharmD and providers to maintain BP goal <140/90 Current regimen:  Losartan '50mg'$  take 1 tablet daily Interventions: Recommended home monitoring 2 to 3 times per week Patient self care activities - Over the next 120 days, patient will: Check blood pressure 2 to 3 times per week, document, and provide at future appointments Ensure daily salt intake < 2300 mg/day  Swelling in lower legs: Goal: decrease swelling in lower legs. Interventions:  Patient will [facility] provider [facility] tomorrow. Recommend ask about swelling Until then recommended he elevated foot 2 to 3 times per day to help with swelling Limit daily salt intake < 2300 mg/day  Hyperlipidemia Lab Results  Component Value Date/Time   LDLCALC 76 04/06/2021 11:31 AM   LDLCALC 79 11/03/2019 10:44 AM  Pharmacist Clinical Goal(s): Over the next 120 days, patient will work with PharmD and providers to achieve LDL goal < 100 Current regimen:  Atorvastatin '20mg'$  daily  Interventions: Counseled on benefits of statin therapy Patient self care activities - Over the next 120 days, patient will: Continue current medication for cholesterol   Dementia Pharmacist Clinical Goal(s) Over the next 120 days, patient will work with PharmD and providers to optimize medication and manage symptoms of dementia. Current regimen:  Donepezil '10mg'$  once daily at  bedtime Menantine '10mg'$  twice a day Vitamin B12 5000 mcg daily Interventions: Discussed medication management with wife Patient self care activities - Over the next 120 days, patient will: Continue to focus on medication adherence using pill box Continue current therapy for dementia   Gait instability / falls / chronic inflammatory demyelinating polyneuritis:  Pharmacist Clinical Goal(s) Over the next 120 days, patient will work with PharmD and providers to optimize medication and reduce falls Current regimen:  Physical therapy  Interventions: Reviewed medications for potential to increase falls.   Patient self care activities - Over the next 120 days, patient will:  Continue current therapy and follow up with Dr [name] Continue with physical therapy Continue to use walker, shower chair and lift chair.  Provided number for [facility]   Medication management Pharmacist Clinical Goal(s): Over the next 120 days, patient will work with PharmD and providers to maintain optimal medication adherence Current pharmacy: [facility] Interventions Comprehensive medication review performed. Continue current medication management strategy Patient self care activities - Over the next 120 days, patient will: Focus on medication adherence by pill box Take medications as prescribed Report any questions or concerns to PharmD and/or provider(s)  Please see past updates related to this goal by clicking on the "Past Updates" button in the selected goal            Patient verbalizes understanding of instructions provided today and agrees to view in [place].   Telephone follow up appointment with care management team member scheduled for: 3 months  Cherre Robins, PharmD Clinical Pharmacist [place]  [place]

## 2021-07-08 DIAGNOSIS — M6281 Muscle weakness (generalized): Secondary | ICD-10-CM | POA: Diagnosis not present

## 2021-07-08 DIAGNOSIS — M62552 Muscle wasting and atrophy, not elsewhere classified, left thigh: Secondary | ICD-10-CM | POA: Diagnosis not present

## 2021-07-08 DIAGNOSIS — M25552 Pain in left hip: Secondary | ICD-10-CM | POA: Diagnosis not present

## 2021-07-08 DIAGNOSIS — R2681 Unsteadiness on feet: Secondary | ICD-10-CM | POA: Diagnosis not present

## 2021-07-15 ENCOUNTER — Encounter: Payer: Self-pay | Admitting: Internal Medicine

## 2021-08-04 ENCOUNTER — Encounter: Payer: Self-pay | Admitting: Internal Medicine

## 2021-08-11 ENCOUNTER — Other Ambulatory Visit: Payer: Self-pay | Admitting: *Deleted

## 2021-08-11 DIAGNOSIS — R269 Unspecified abnormalities of gait and mobility: Secondary | ICD-10-CM

## 2021-08-11 DIAGNOSIS — R200 Anesthesia of skin: Secondary | ICD-10-CM

## 2021-08-11 DIAGNOSIS — R413 Other amnesia: Secondary | ICD-10-CM

## 2021-08-11 DIAGNOSIS — G629 Polyneuropathy, unspecified: Secondary | ICD-10-CM

## 2021-08-11 DIAGNOSIS — G6181 Chronic inflammatory demyelinating polyneuritis: Secondary | ICD-10-CM

## 2021-08-11 DIAGNOSIS — M4807 Spinal stenosis, lumbosacral region: Secondary | ICD-10-CM

## 2021-08-16 ENCOUNTER — Other Ambulatory Visit: Payer: Self-pay | Admitting: Internal Medicine

## 2021-08-17 NOTE — Telephone Encounter (Signed)
Referral has been faxed to [facility]. Phone: [phone].

## 2021-09-06 DIAGNOSIS — R293 Abnormal posture: Secondary | ICD-10-CM | POA: Diagnosis not present

## 2021-09-06 DIAGNOSIS — M25659 Stiffness of unspecified hip, not elsewhere classified: Secondary | ICD-10-CM | POA: Diagnosis not present

## 2021-09-06 DIAGNOSIS — M6281 Muscle weakness (generalized): Secondary | ICD-10-CM | POA: Diagnosis not present

## 2021-09-06 DIAGNOSIS — R262 Difficulty in walking, not elsewhere classified: Secondary | ICD-10-CM | POA: Diagnosis not present

## 2021-09-12 DIAGNOSIS — M25659 Stiffness of unspecified hip, not elsewhere classified: Secondary | ICD-10-CM | POA: Diagnosis not present

## 2021-09-12 DIAGNOSIS — R262 Difficulty in walking, not elsewhere classified: Secondary | ICD-10-CM | POA: Diagnosis not present

## 2021-09-12 DIAGNOSIS — M6281 Muscle weakness (generalized): Secondary | ICD-10-CM | POA: Diagnosis not present

## 2021-09-12 DIAGNOSIS — R293 Abnormal posture: Secondary | ICD-10-CM | POA: Diagnosis not present

## 2021-09-13 ENCOUNTER — Ambulatory Visit (INDEPENDENT_AMBULATORY_CARE_PROVIDER_SITE_OTHER): Payer: Medicare Other | Admitting: Pharmacist

## 2021-09-13 DIAGNOSIS — E785 Hyperlipidemia, unspecified: Secondary | ICD-10-CM

## 2021-09-13 DIAGNOSIS — I10 Essential (primary) hypertension: Secondary | ICD-10-CM

## 2021-09-13 DIAGNOSIS — F03A0 Unspecified dementia, mild, without behavioral disturbance, psychotic disturbance, mood disturbance, and anxiety: Secondary | ICD-10-CM

## 2021-09-13 NOTE — Chronic Care Management (AMB) (Signed)
Chronic Care Management Pharmacy Note  09/13/2021 Name:  Martin Chase MRN:  [id number] DOB:  06-Apr-1946  Summary: Patient is doing well. Edema has improved.  His wife does mention occasional diarrhea, which usually occurs after he has eaten a small evening meal.  Reminded that it is important to take donepezil with food to avoid GI side effects (mostly nausea but also could cause diarrhea)  She also mentioned that he has a rash at the top of his bottom. Recommended trial of over the counter Desitin barrier cream applied 2 to 3 times per day.Notify office if not improving in 1 week or if symptoms worsen.  Remained to change soiled incontinence briefs as soon as possible.     Subjective: Martin Chase is an 75 y.o. year old male who is a primary patient of Paz, Alda Berthold, MD.  The CCM team was consulted for assistance with disease management and care coordination needs.    Engaged with patient's wife / caregiver by telephone for follow up visit in response to provider referral for pharmacy case management and/or care coordination services.   Consent to Services:  The patient was given information about Chronic Care Management services, agreed to services, and gave verbal consent prior to initiation of services.  Please see initial visit note for detailed documentation.   Patient Care Team: Colon Branch, MD as PCP - General (Internal Medicine) Berle Mull, MD as Consulting Physician (Sports Medicine) Armbruster, Carlota Raspberry, MD as Consulting Physician (Gastroenterology) Festus Aloe, MD as Consulting Physician (Urology) Cira Rue, RN Nurse Navigator as Registered Nurse (Medical Oncology) [name], Nanine Means, MD (Neurology) Consuella Lose, MD as Consulting Physician (Neurosurgery) Cherre Robins, [place] (Pharmacist)  Recent office visits: 04/18/21 [name] MD(PCP)- Patient seen for rash. Patient advised to use Hydrocortisone 1%. Follow up as needed. 04/06/2021-PCP (Dr Larose Kells) F/U  chronic conditions. No medicaiton changes  Recent consult visits: 07/06/2021 [facility] (Dr Cassell Clement) follow up. Started furosemide 64m daily for 3 days then as needed for lower extremity edema.  04/18/21 RArlice ColtMD(Neurology)- Seen for follow up. Patient was started on Folic acid and taken off Prednisone. Return to clinic in 12 months 02/14/2021 Neurology (Dr SFelecia Shelling Seen for CIDPS and dementia. Added Memantine 10 mg 1/2 qd for 1 week, then 1/2 bid for 1 week, then 1 tablet bid thereafter. Evlauated for CIDP by CSF check. CFS noted to have protein presents. Prednisone 694mdaily stated on 03/07/2021.   Hospital visits: None in previous 6 months  Objective:  Lab Results  Component Value Date   CREATININE 1.09 04/06/2021   CREATININE 0.84 10/06/2020   CREATININE 0.90 04/01/2020    Lab Results  Component Value Date   HGBA1C 6.7 (H) 04/06/2021   Last diabetic Eye exam: No results found for: HMDIABEYEEXA  Last diabetic Foot exam: No results found for: HMDIABFOOTEX      Component Value Date/Time   CHOL 205 (H) 04/06/2021 1131   CHOL 164 11/03/2019 1044   TRIG 81.0 04/06/2021 1131   HDL 112.80 04/06/2021 1131   HDL 64 11/03/2019 1044   CHOLHDL 2 04/06/2021 1131   VLDL 16.2 04/06/2021 1131   LDLCALC 76 04/06/2021 1131   LDLCALC 79 11/03/2019 1044    Hepatic Function Latest Ref Rng & Units 04/06/2021 02/18/2021 10/06/2020  Total Protein 6.0 - 8.3 g/dL 5.9(L) - 7.0  Albumin 3.5 - 5.2 g/dL 3.8 4.5 4.2  AST 0 - 37 U/L 12 - 22  ALT 0 - 53  U/L 22 - 12  Alk Phosphatase 39 - 117 U/L 55 - 72  Total Bilirubin 0.2 - 1.2 mg/dL 0.8 - 0.6  Bilirubin, Direct 0.01 - 0.4 - - -    Lab Results  Component Value Date/Time   TSH 3.36 04/06/2021 11:31 AM   TSH 4.58 (H) 10/06/2020 01:47 PM   FREET4 0.76 04/06/2021 11:31 AM   FREET4 0.67 10/06/2020 01:47 PM    CBC Latest Ref Rng & Units 04/06/2021 11/03/2019 05/01/2019  WBC 4.0 - 10.5 K/uL 10.3 6.6 9.0  Hemoglobin 13.0 - 17.0 g/dL 13.2  12.9(L) 13.0  Hematocrit 39.0 - 52.0 % 39.5 39.0 38.6(L)  Platelets 150.0 - 400.0 K/uL 167.0 241.0 227.0    No results found for: VD25OH  Clinical ASCVD: No  The ASCVD Risk score (Arnett DK, et al., 2019) failed to calculate for the following reasons:   The valid HDL cholesterol range is 20 to 100 mg/dL     Social History   Tobacco Use  Smoking Status Former   Packs/day: 1.00   Years: 5.00   Pack years: 5.00   Types: Cigarettes   Quit date: 01/29/1986   Years since quitting: 35.6  Smokeless Tobacco Never   BP Readings from Last 3 Encounters:  04/18/21 (!) 135/58  04/18/21 124/72  04/06/21 132/80   Pulse Readings from Last 3 Encounters:  04/18/21 71  04/18/21 67  04/06/21 (!) 57   Wt Readings from Last 3 Encounters:  04/18/21 209 lb 8 oz (95 kg)  04/18/21 208 lb 4 oz (94.5 kg)  04/06/21 208 lb 4 oz (94.5 kg)    Assessment: Review of patient past medical history, allergies, medications, health status, including review of consultants reports, laboratory and other test data, was performed as part of comprehensive evaluation and provision of chronic care management services.   SDOH:  (Social Determinants of Health) assessments and interventions performed:  SDOH Interventions    Flowsheet Row Most Recent Value  SDOH Interventions   Financial Strain Interventions Intervention Not Indicated  Physical Activity Interventions Other (Comments)  [Physical therapy 3 days per week.]        CCM Care Plan  No Known Allergies  Medications Reviewed Today     Reviewed by Cherre Robins, RPH-CPP (Pharmacist) on 09/13/21 at 1422  Med List Status: <None>   Medication Order Taking? Sig Documenting Provider Last Dose Status Informant  atorvastatin (LIPITOR) 20 MG tablet [id number] Yes TAKE 1 TABLET BY MOUTH  DAILY Colon Branch, MD Taking Active   Calcium Carbonate-Vit D-Min (CALCIUM 1200 PO) [id number] Yes Take 1 tablet by mouth in the morning and at bedtime. [provider] Taking Active   Cyanocobalamin (VITAMIN B-12) 5000 MCG TBDP [id number] Yes Take 2,500 mcg by mouth daily. [provider] Taking Active Self  donepezil (ARICEPT) 10 MG tablet [id number] Yes Take 1 tablet (10 mg total) by mouth at bedtime. Colon Branch, MD Taking Active   folic acid (FOLVITE) 053 MCG tablet [id number] Yes Take 800 mcg by mouth daily. [provider] Taking Active   furosemide (LASIX) 20 MG tablet [id number] Yes Take 20 mg by mouth. [provider] Taking Active   losartan (COZAAR) 50 MG tablet [id number] Yes TAKE 1 TABLET BY MOUTH  DAILY Colon Branch, MD Taking Active   memantine [facility]) 10 MG tablet [id number] Yes Take 1 tablet (10 mg total) by mouth 2 (two) times daily. Sater, Nanine Means, MD Taking Active   Multiple Vitamin (MULTIVITAMIN WITH MINERALS) TABS  tablet [id number] Yes Take 1 tablet by mouth daily. Centrum [provider] Taking Active Self  Probiotic Product (PROBIOTIC PO) [id number] Yes Take 1 Dose by mouth daily. [provider] Taking Active   tamsulosin (FLOMAX) 0.4 MG CAPS capsule [id number] Yes Take 0.4 mg by mouth daily after supper. [provider] Taking Active   Med List Note Sharene Butters, CPhT 09/10/18 1059): CPAP MACHINE AT NIGHT.            Patient Active Problem List   Diagnosis Date Noted   Chronic inflammatory demyelinating polyneuritis ([place]) 04/18/2021   Spinal stenosis 11/09/2020   Gait disturbance 09/21/2020   Polyneuropathy 09/21/2020   Memory loss 09/21/2020   Numbness 09/21/2020   Chronic midline low back pain 04/04/2020   Mild dementia 11/04/2019   Prostate cancer ([place]) 01/06/2019   Benign neoplasm of colon    Hypertension    PCP NOTES >>>>>>>>>>>>>>>>>>>>>>>>>>> 01/31/2016   Annual physical exam 10/20/2014   Hyperglycemia 10/20/2014   Osteoarthritis 01/30/2011   Erectile dysfunction 01/30/2011   Hyperlipidemia 08/23/2007    Immunization History  Administered Date(s)  Administered   Influenza Split 09/22/2011, 08/25/2014   Influenza Whole 08/23/2007, 07/09/2009, 07/25/2010   Influenza, High Dose Seasonal PF 08/03/2017, 08/05/2018, 08/06/2019, 08/06/2019, 08/05/2020   Influenza-Unspecified 08/17/2015   PFIZER(Purple Top)SARS-COV-2 Vaccination 11/25/2019, 12/16/2019, 08/23/2020, 12/15/2020   Pneumococcal Conjugate-13 10/20/2014   Pneumococcal Polysaccharide-23 07/03/2008, 08/03/2017   Td 07/23/2009   Tdap 11/03/2019   Zoster Recombinat (Shingrix) 07/07/2020, 09/23/2020   Zoster, Live 03/25/2011    Conditions to be addressed/monitored: HTN, HLD, Dementia and pre DM; BPH; gait distrubance; CIDP; frequent falls  Care Plan : General Pharmacy (Adult)  Updates made by Cherre Robins, RPH-CPP since 09/13/2021 12:00 AM     Problem: medication and chronic care management   Priority: High  Onset Date: 04/12/2021     Long-Range Goal: Provide education, support and care coordination for medication therapy and chronic conditions   Start Date: 04/12/2021  Recent Progress: On track  Priority: High  Note:   Current Barriers:  Unable to self administer medications as prescribed (patient's wife assists with medication administration Chronic Disease Management support, education, and care coordination needs related to Hypertension, Hyperlipidemia, and Dementia  Pharmacist Clinical Goal(s):  Over the next 180 days, patient will maintain control of HTN, hyperlipidemia and pre DM as evidenced by attaining goals below  through collaboration with PharmD and provider.  Ensure that patient's caregiver is supported  Interventions: 1:1 collaboration with Colon Branch, MD regarding development and update of comprehensive plan of care as evidenced by provider attestation and co-signature Inter-disciplinary care team collaboration (see longitudinal plan of care) Comprehensive medication review performed; medication list updated in electronic medical  record  Hypertension Controlled; BP goal <140/90 Home BP readings:  105 to 130 / 70's Denies dizziness Occasional weakness thought to be related to CIDP Current regimen:  Losartan 33m take 1 tablet daily Interventions: Recommended home monitoring 2 to 3 times per week; document, and provide at future appointments Ensure daily salt intake < 2300 mg/day Verified that losartan has been refilled by [facility] and in transit.  Edema:  Denied edema in the last 3 weeks.  Patient took furosemide for 3 days and occasionally since furosemide started 07/06/2021 Elevating feet / legs once daily, most days Current regimen:  Furosemide 242mdaily if needed for swelling Denies shortness of breath  Interventions:  Continue to elevated feet once daily Continue to use furosemide if needed for swelling  Limit  daily salt intake < 2300 mg/day  Hyperlipidemia At goal; LDL goal < 100 Current regimen:  Atorvastatin 28m daily  Interventions: Counseled on benefits of statin therapy Continue current medication for cholesterol  Dementia Stable; Goal: optimize medication and manage symptoms of dementia. [name] reports today that memory is stable Has follow up with Dr SFelecia Shellingin December [facility] Anticipated move in date though is not expected until sometime in 2023.  Patient has episode of diarrhea for about 3 days. Wife felt it was related to donepezil. Diarrehea has mostly resolved but sometimes returns. Suspect it might occur when he skips dinner or eats a small dinner.  Current regimen:  Donepezil 195monce daily at bedtime Menantine 1033mwice a day Vitamin B12 5000m36maily Interventions: Discussed medication management with wife Recommended to take donepezil with food to lessen diarrhea.  Continue current therapy for dementia  Gait instability / falls / chronic inflammatory demyelinating  polyneuritis:  A little improvement; no recent falls Patient does have pre DM but last A1c was 6.7% - increased blood glucose likely related to high dose prednisone use.  Patient did not see improvement with prednisone has been stopped.  [name] still interested in getting second opinion about Parkinson's Disease / movement disorder.  [facility] Current regimen:  Physical therapy  Interventions: Reviewed medications for potential to increase falls.  BP and BPH medications can cause dizziness but patient denies dizziness Continue physical therapy Continue to use walker, shower chair and lift chair.   Health Maintenance:  Reviewed vaccination history and discussed benefits of COVID booster and annual flu vaccines Patient to get the flu and COVID bivalent booster in the next few weeks.   Medication management Current pharmacy: [facility] order Interventions Comprehensive medication review performed. Continue current medication management strategy Reminded to refill atorvastatin and losartan (confirmed that [facility] has mailed out)   Patient Goals/Self-Care Activities Over the next 180 days, patient will:  take medications as prescribed, check blood pressure 2 to 3 times per week, document, and provide at future appointments, and notify provider of symptom of hyperglycemia         Medication Assistance: None required.  Patient affirms current coverage meets needs.  [place] [facility] [place]  [place]: [phone]: [phone] ([id number]: [phone]: [phone] [facility] ([facility]) - [place] [id number]: [phone]: [phone] Up:  Patient agrees to Care Plan and Follow-up.  Plan: The care management team will reach out to the patient again over the next 90 days.    TammCherre RobinsarmD Clinical Pharmacist [facility]-[phone]

## 2021-09-14 DIAGNOSIS — R293 Abnormal posture: Secondary | ICD-10-CM | POA: Diagnosis not present

## 2021-09-14 DIAGNOSIS — R262 Difficulty in walking, not elsewhere classified: Secondary | ICD-10-CM | POA: Diagnosis not present

## 2021-09-14 DIAGNOSIS — M25659 Stiffness of unspecified hip, not elsewhere classified: Secondary | ICD-10-CM | POA: Diagnosis not present

## 2021-09-14 DIAGNOSIS — M6281 Muscle weakness (generalized): Secondary | ICD-10-CM | POA: Diagnosis not present

## 2021-09-17 NOTE — Patient Instructions (Signed)
Visit Information CARE PLAN ENTRY  Hypertension BP Readings from Last 3 Encounters:  04/18/21 (!) 135/58  04/18/21 124/72  04/06/21 132/80   Pharmacist Clinical Goal(s): Over the next 120 days, patient will work with PharmD and providers to maintain BP goal <140/90 Current regimen:  Losartan 50mg  take 1 tablet daily Interventions: Recommended home monitoring 2 to 3 times per week Patient self care activities - Over the next 120 days, patient will: Check blood pressure 2 to 3 times per week, document, and provide at future appointments Ensure daily salt intake < 2300 mg/day  Swelling in lower legs: Goal: decrease swelling in lower legs. Current regimen:  Furosemide 20mg  daily as needed for swelling  Interventions:  Continue to elevate feet daily to help with swelling Limit daily salt intake < 2300 mg/day  Hyperlipidemia Lab Results  Component Value Date/Time   LDLCALC 76 04/06/2021 11:31 AM   LDLCALC 79 11/03/2019 10:44 AM   Pharmacist Clinical Goal(s): Over the next 120 days, patient will work with PharmD and providers to achieve LDL goal < 100 Current regimen:  Atorvastatin 20mg  daily  Interventions: Counseled on benefits of statin therapy Patient self care activities - Over the next 120 days, patient will: Continue current medication for cholesterol  Dementia Pharmacist Clinical Goal(s) Over the next 120 days, patient will work with PharmD and providers to optimize medication and manage symptoms of dementia. Current regimen:  Donepezil 10mg  once daily at bedtime Menantine 10mg  twice a day Vitamin B12 5000 mcg daily Interventions: Discussed medication management with wife Patient self care activities - Over the next 120 days, patient will: Continue to focus on medication adherence using pill box Take donepezil with food to decrease diarrhea Continue current therapy for dementia   Gait instability / falls / chronic inflammatory demyelinating polyneuritis:   Pharmacist Clinical Goal(s) Over the next 120 days, patient will work with PharmD and providers to optimize medication and reduce falls Current regimen:  Physical therapy  Interventions: Reviewed medications for potential to increase falls.   Patient self care activities - Over the next 120 days, patient will:  Continue current therapy and follow up with Dr [name] Continue with physical therapy Continue to use walker, shower chair and lift chair.   Health Maintenance:  Reviewed vaccination history and discussed benefits of COVID booster and annual flu vaccines Patient to get the flu and COVID bivalent booster in the next few weeks.   Medication management Pharmacist Clinical Goal(s): Over the next 120 days, patient will work with PharmD and providers to maintain optimal medication adherence Current pharmacy: [facility] Interventions Comprehensive medication review performed. Continue current medication management strategy Patient self care activities - Over the next 120 days, patient will: Focus on medication adherence by pill box Take medications as prescribed Report any questions or concerns to PharmD and/or provider(s)Acute need:   Try over the counter Desitin barrier cream applied 2 to 3 times per day.Notify office if not improving in 1 week or if symptoms worsen.  Make sure to change soiled briefs   Patient verbalizes understanding of instructions provided today and agrees to view in [place].   Telephone follow up appointment with care management team member scheduled for: 3 months  Cherre Robins, PharmD Clinical Pharmacist [place] [facility]

## 2021-09-23 DIAGNOSIS — R262 Difficulty in walking, not elsewhere classified: Secondary | ICD-10-CM | POA: Diagnosis not present

## 2021-09-23 DIAGNOSIS — M25659 Stiffness of unspecified hip, not elsewhere classified: Secondary | ICD-10-CM | POA: Diagnosis not present

## 2021-09-23 DIAGNOSIS — R293 Abnormal posture: Secondary | ICD-10-CM | POA: Diagnosis not present

## 2021-09-23 DIAGNOSIS — M6281 Muscle weakness (generalized): Secondary | ICD-10-CM | POA: Diagnosis not present

## 2021-10-04 DIAGNOSIS — M25659 Stiffness of unspecified hip, not elsewhere classified: Secondary | ICD-10-CM | POA: Diagnosis not present

## 2021-10-04 DIAGNOSIS — R262 Difficulty in walking, not elsewhere classified: Secondary | ICD-10-CM | POA: Diagnosis not present

## 2021-10-04 DIAGNOSIS — R293 Abnormal posture: Secondary | ICD-10-CM | POA: Diagnosis not present

## 2021-10-04 DIAGNOSIS — M6281 Muscle weakness (generalized): Secondary | ICD-10-CM | POA: Diagnosis not present

## 2021-10-05 DIAGNOSIS — I10 Essential (primary) hypertension: Secondary | ICD-10-CM | POA: Diagnosis not present

## 2021-10-05 DIAGNOSIS — F03A0 Unspecified dementia, mild, without behavioral disturbance, psychotic disturbance, mood disturbance, and anxiety: Secondary | ICD-10-CM | POA: Diagnosis not present

## 2021-10-05 DIAGNOSIS — E785 Hyperlipidemia, unspecified: Secondary | ICD-10-CM

## 2021-10-06 ENCOUNTER — Ambulatory Visit (INDEPENDENT_AMBULATORY_CARE_PROVIDER_SITE_OTHER): Payer: Medicare Other | Admitting: Internal Medicine

## 2021-10-06 ENCOUNTER — Encounter: Payer: Self-pay | Admitting: Internal Medicine

## 2021-10-06 VITALS — BP 116/64 | HR 61 | Temp 98.0°F | Resp 18 | Ht 71.0 in | Wt 213.2 lb

## 2021-10-06 DIAGNOSIS — I10 Essential (primary) hypertension: Secondary | ICD-10-CM | POA: Diagnosis not present

## 2021-10-06 DIAGNOSIS — L89301 Pressure ulcer of unspecified buttock, stage 1: Secondary | ICD-10-CM | POA: Insufficient documentation

## 2021-10-06 DIAGNOSIS — E038 Other specified hypothyroidism: Secondary | ICD-10-CM | POA: Diagnosis not present

## 2021-10-06 DIAGNOSIS — R269 Unspecified abnormalities of gait and mobility: Secondary | ICD-10-CM | POA: Diagnosis not present

## 2021-10-06 DIAGNOSIS — F03A0 Unspecified dementia, mild, without behavioral disturbance, psychotic disturbance, mood disturbance, and anxiety: Secondary | ICD-10-CM

## 2021-10-06 DIAGNOSIS — R739 Hyperglycemia, unspecified: Secondary | ICD-10-CM

## 2021-10-06 LAB — BASIC METABOLIC PANEL
BUN: 24 mg/dL — ABNORMAL HIGH (ref 6–23)
CO2: 32 mEq/L (ref 19–32)
Calcium: 9 mg/dL (ref 8.4–10.5)
Chloride: 104 mEq/L (ref 96–112)
Creatinine, Ser: 0.94 mg/dL (ref 0.40–1.50)
GFR: 78.46 mL/min (ref >60.00)
Glucose, Bld: 105 mg/dL — ABNORMAL HIGH (ref 70–99)
Potassium: 4.7 mEq/L (ref 3.5–5.1)
Sodium: 142 mEq/L (ref 135–145)

## 2021-10-06 LAB — HEMOGLOBIN A1C: Hgb A1c MFr Bld: 6.6 % — ABNORMAL HIGH (ref 4.6–6.5)

## 2021-10-06 LAB — TSH: TSH: 5.22 u[IU]/mL (ref 0.35–5.50)

## 2021-10-06 LAB — T4, FREE: Free T4: 0.6 ng/dL (ref 0.60–1.60)

## 2021-10-06 NOTE — Assessment & Plan Note (Signed)
Prediabetes: Check A1c HTN: BP seems well controlled, continue Lasix, losartan.  Check BMP. Increased TSH: Recheck labs Dementia: Seems a stable, no behavioral issues. Gait disorder: Request a new walker with only front wheels, he has one with 4 wheels and the patient refuses to use it.  Fortunately, no recent falls. Prostate cancer: To see urology next week Pressure ulcer: Grade 1 pressure ulcer, had a long conversation about prevention including a donut pillow, frequent turning, see AVS. Preventive care: Recommend the COVID-vaccine. RTC CPX 4 months

## 2021-10-06 NOTE — Patient Instructions (Addendum)
Proceed with a COVID vaccination   GO TO THE LAB : Get the blood work     [place], [place] back for  a physical exam in 4 months    Preventing Pressure Injuries A pressure injury, sometimes called a bedsore or a pressure ulcer, is an injury to the skin and underlying tissue caused by pressure. A pressure injury can happen when your skin presses against a surface, such as a mattress or wheelchair seat, for too long. The pressure on the blood vessels causes reduced blood flow to your skin. This can eventually cause the skin tissue to die and break down into a wound. Pressure injuries usually develop: Over bony parts of the body, such as the tailbone, shoulders, elbows, hips, and heels. Under medical devices, such as respiratory equipment, stockings, tubes, and splints. How can this condition affect me? Pressure injuries are caused by a lack of blood supply to an area of skin. These injuries begin as a reddened area on the skin and can become an open sore. They can result from intense pressure over a short period of time or from less pressure over a long period of time. Pressure injuries can vary in severity. They can cause pain, muscle damage, and infection. What can increase my risk? This condition is more likely to develop in people who: Are in the hospital or an extended care facility. Are bedridden or in a wheelchair. Have an injury or disease that keeps them from: Moving normally. Feeling pain or pressure. Communicating if they feel pain or pressure. Have a condition that: Makes them sleepy or less alert. Causes poor blood flow. Need to wear a medical device. Have poor control of their bladder or bowel functions (incontinence). Have poor nutrition (malnutrition). Have had this condition before. Are of certain ethnicities. People of African American, Latino, or Hispanic descent are at higher risk compared to other ethnic groups. What  actions can I take to prevent pressure injuries? Reducing and redistributing pressure Do not lie or sit in one position for a long time. Move or change position: Every hour when out of bed in a chair. Every two hours when in bed. As often as told by your health care provider. Use pillows, wedges, or cushions to redistribute pressure. Ask your health care provider to recommend a mattress, cushions, or pads for you. Use medical devices that do not rub your skin. Tell your health care provider if one of your medical devices is causing pain or irritation. Skin care If you are in the hospital, your health care providers: Will inspect your skin, including areas under or around medical devices, at least twice a day. May recommend that you use certain types of bedding to help prevent pressure injuries. These may include a pad, mattress, or chair cushion that is filled with gel, air, water, or foam. Will evaluate your nutrition and consult a dietitian if needed. Will inspect and change any wound dressings regularly. May help you move into different positions every few hours. Will adjust any medical devices and braces as needed to limit pressure on your skin. Will keep your skin clean and dry. May use gentle cleansers and skin protectants if you are incontinent. Will moisturize any dry skin. In general, at home: Keep your skin clean and dry. Gently pat your skin dry. Do not rub or massage bony areas of your skin. Moisturize dry skin. Use gentle cleansers and skin protectants routinely if you are incontinent. Check  your skin at least once a day for any changes in color and for any new blisters or sores. Make sure to check under and around any medical devices and between skin folds. Have a caregiver do this for you if you are not able.  Lifestyle Be as active as you can every day. Ask your health care provider to suggest safe exercises or activities. Do not abuse drugs or alcohol. Do not use any  products that contain nicotine or tobacco, such as cigarettes, e-cigarettes, and chewing tobacco. If you need help quitting, ask your health care provider. General instructions  Take over-the-counter and prescription medicines only as told by your health care provider. Work with your health care provider to manage any chronic health conditions. Eat a healthy diet that includes protein, vitamins, and minerals. Ask your health care provider what types of food you should eat. Drink enough fluid to keep your urine pale yellow. Keep all follow-up visits as told by your health care provider. This is important. Contact a health care provider if you: Feel or see any changes in your skin. Summary A pressure injury, sometimes called a bedsore or a pressure ulcer, is an injury to the skin and underlying tissue caused by pressure. Do not lie or sit in one position for a long time. Check your skin at least once a day for any changes in color and for any new blisters or sores. Make sure to check under and around any medical devices and between skin folds. Have a caregiver do this for you if you are not able. Eat a healthy diet that includes protein, vitamins, and minerals. Ask your health care provider what types of food you should eat. This information is not intended to replace advice given to you by your health care provider. Make sure you discuss any questions you have with your health care provider. Document Revised: 02/14/2019 Document Reviewed: 07/16/2018 Elsevier Patient Education  [place].

## 2021-10-06 NOTE — Progress Notes (Signed)
Subjective:    Patient ID: Martin Chase, male    DOB: 01-03-1946, 75 y.o.   MRN: [id number]  DOS:  10/06/2021 Type of visit - description: f/u  Routine checkup, here with his wife. They have no major concerns.  General he feels well. The wife reports some redness buttocks the area . No discharge.  No openings.   Review of Systems See above   Past Medical History:  Diagnosis Date   Allergy    Anemia 1996   due to a bleeding from aspirin   Arthritis    Cataract    History of blood transfusion 1996   HOH (hard of hearing)    uses hearing aids/ hearing on left side worse   Hyperlipidemia    Hypertension    Prediabetes 10/20/2014   Prostate cancer ([place])    Sleep apnea    uses c-pap   Spinal stenosis     Past Surgical History:  Procedure Laterality Date   COLONOSCOPY  07/30/2019   COLONOSCOPY WITH PROPOFOL N/A 09/16/2018   Procedure: COLONOSCOPY WITH PROPOFOL;  Surgeon: Yetta Flock, MD;  Location: [facility];  Service: Gastroenterology;  Laterality: N/A;   POLYPECTOMY  09/16/2018   Procedure: POLYPECTOMY;  Surgeon: Yetta Flock, MD;  Location: [facility];  Service: Gastroenterology;;   POLYPECTOMY     SUBMUCOSAL INJECTION  09/16/2018   Procedure: SUBMUCOSAL INJECTION;  Surgeon: Yetta Flock, MD;  [facility]: [facility];  Service: Gastroenterology;;   TONSILLECTOMY     VASECTOMY      Allergies as of 10/06/2021   No Known Allergies      Medication List        Accurate as of October 06, 2021  5:40 PM. If you have any questions, ask your nurse or doctor.          atorvastatin 20 MG tablet Commonly known as: LIPITOR TAKE 1 TABLET BY MOUTH  DAILY   CALCIUM 1200 PO Take 1 tablet by mouth in the morning and at bedtime.   donepezil 10 MG tablet Commonly known as: ARICEPT Take 1 tablet (10 mg total) by mouth at bedtime.   folic acid 416 MCG tablet Commonly known as: FOLVITE Take 800 mcg by mouth daily.   furosemide 20 MG  tablet Commonly known as: LASIX Take 20 mg by mouth.   losartan 50 MG tablet Commonly known as: COZAAR TAKE 1 TABLET BY MOUTH  DAILY   memantine 10 MG tablet Commonly known as: NAMENDA Take 1 tablet (10 mg total) by mouth 2 (two) times daily.   multivitamin with minerals Tabs tablet Take 1 tablet by mouth daily. Centrum   PROBIOTIC PO Take 1 Dose by mouth daily.   tamsulosin 0.4 MG Caps capsule Commonly known as: FLOMAX Take 0.4 mg by mouth daily after supper.   Vitamin B-12 5000 MCG Tbdp Take 2,500 mcg by mouth daily.               Durable Medical Equipment  (From admission, onward)           Start     Ordered   10/06/21 0000  For home use only DME Walker       Comments: Front wheel foldable walker  Question Answer Comment  Patient needs a walker to treat with the following condition Dementia [facility])   Patient needs a walker to treat with the following condition Gait disorder      10/06/21 1142  Objective:   Physical Exam Skin:       BP 116/64 (BP Location: Left Arm, Patient Position: Sitting, Cuff Size: Small)   Pulse 61   Temp 98 F (36.7 C) (Oral)   Resp 18   Ht 5\' 11"  (1.803 m)   Wt 213 lb 4 oz (96.7 kg)   SpO2 98%   BMI 29.74 kg/m  General:   Well developed, NAD, BMI noted. HEENT:  Normocephalic . Face symmetric, atraumatic Lungs:  CTA B Normal respiratory effort, no intercostal retractions, no accessory muscle use. Heart: RRR,  no murmur.  Lower extremities: no pretibial edema bilaterally  Skin: Not pale. Not jaundice Neurologic:  alert & pleasantly demented, follows all commands.  No formal neurological exam done today.[name]  Speech normal, gait assisted by quad cane. Behavior appropriate. No anxious or depressed appearing.      Assessment     Assessment Prediabetes HTN ([facility] ~ [facility]) Hyperlipidemia DJD - chronic back pain Allergies PUD per pt, dx early 2000s, related to ASA, was told not to  take Prostate cancer: DX 12-2018, UroLift, finished external radiation treatment ~ 07/2019 OSA, on CPAP Dementia: 67/6720: Normal RPR, folic acid and N47.  0-9628: Brain MRI normal for age.  No acute. Neuropathy, CIPD dx ~ 02/2021    PLAN Prediabetes: Check A1c HTN: BP seems well controlled, continue Lasix, losartan.  Check BMP. Increased TSH: Recheck labs Dementia: Seems a stable, no behavioral issues. Gait disorder: Request a new walker with only front wheels, he has one with 4 wheels and the patient refuses to use it.  Fortunately, no recent falls. Prostate cancer: To see urology next week Pressure ulcer: Grade 1 pressure ulcer, had a long conversation about prevention including a donut pillow, frequent turning, see AVS. Preventive care: Recommend the COVID-vaccine. RTC CPX 4 months    This visit occurred during the SARS-CoV-2 public health emergency.  Safety protocols were in place, including screening questions prior to the visit, additional usage of staff PPE, and extensive cleaning of exam room while observing appropriate contact time as indicated for disinfecting solutions.

## 2021-10-07 DIAGNOSIS — R293 Abnormal posture: Secondary | ICD-10-CM | POA: Diagnosis not present

## 2021-10-07 DIAGNOSIS — R262 Difficulty in walking, not elsewhere classified: Secondary | ICD-10-CM | POA: Diagnosis not present

## 2021-10-07 DIAGNOSIS — M25659 Stiffness of unspecified hip, not elsewhere classified: Secondary | ICD-10-CM | POA: Diagnosis not present

## 2021-10-07 DIAGNOSIS — M6281 Muscle weakness (generalized): Secondary | ICD-10-CM | POA: Diagnosis not present

## 2021-10-07 DIAGNOSIS — R2689 Other abnormalities of gait and mobility: Secondary | ICD-10-CM | POA: Diagnosis not present

## 2021-10-11 DIAGNOSIS — M25659 Stiffness of unspecified hip, not elsewhere classified: Secondary | ICD-10-CM | POA: Diagnosis not present

## 2021-10-11 DIAGNOSIS — M6281 Muscle weakness (generalized): Secondary | ICD-10-CM | POA: Diagnosis not present

## 2021-10-11 DIAGNOSIS — R262 Difficulty in walking, not elsewhere classified: Secondary | ICD-10-CM | POA: Diagnosis not present

## 2021-10-11 DIAGNOSIS — R293 Abnormal posture: Secondary | ICD-10-CM | POA: Diagnosis not present

## 2021-10-14 DIAGNOSIS — R3914 Feeling of incomplete bladder emptying: Secondary | ICD-10-CM | POA: Diagnosis not present

## 2021-10-14 DIAGNOSIS — N139 Obstructive and reflux uropathy, unspecified: Secondary | ICD-10-CM | POA: Diagnosis not present

## 2021-10-17 ENCOUNTER — Ambulatory Visit: Payer: Medicare Other | Admitting: Neurology

## 2021-10-17 ENCOUNTER — Encounter: Payer: Self-pay | Admitting: Neurology

## 2021-10-17 VITALS — BP 128/58 | HR 58 | Ht 71.0 in | Wt 214.0 lb

## 2021-10-17 DIAGNOSIS — G6181 Chronic inflammatory demyelinating polyneuritis: Secondary | ICD-10-CM

## 2021-10-17 DIAGNOSIS — R269 Unspecified abnormalities of gait and mobility: Secondary | ICD-10-CM

## 2021-10-17 DIAGNOSIS — R413 Other amnesia: Secondary | ICD-10-CM

## 2021-10-17 DIAGNOSIS — R200 Anesthesia of skin: Secondary | ICD-10-CM | POA: Diagnosis not present

## 2021-10-17 MED ORDER — DONEPEZIL HCL 10 MG PO TABS: 10.0000 mg | ORAL_TABLET | Freq: Every day | ORAL | 4 refills | Status: DC

## 2021-10-17 MED ORDER — MEMANTINE HCL 10 MG PO TABS: 10.0000 mg | ORAL_TABLET | Freq: Two times a day (BID) | ORAL | 4 refills | Status: DC

## 2021-10-17 NOTE — Progress Notes (Signed)
[facility]  PATIENT: Martin Chase DOB: [birth year]  REFERRING DOCTOR OR PCP: Belinda Fisher SOURCE: Patient, notes from PCP, imaging and lab reports, MRI images personally reviewed.  _________________________________   HISTORICAL  CHIEF COMPLAINT:  Chief Complaint  Patient presents with   Follow-up    RM 2, with wife. Feels memory is stable.    HISTORY OF PRESENT ILLNESS:  Martin Chase is a 75 y.o. man with gait disturbance,  episode of unresponsiveness and memory loss.  10/17/2021 He feels his gait is much different than the last visit.  He stumbles and holds onto the walls.   No falls.  He is using a cane inside the home and walker outside.   He feels numbness in his  feet is doing the same and only in his feet  He denies painful dysesthesias.    He finds wiggling his toes to be harder but no major weakness.  NCV/EMG showed polyneuropathy (demyelinating > axonal).  He has numbness but no pain.  Lumbar puncture showed elevated CSF protein.  He was tried on prednisone for suspected CIDP but he did not get any benefit.  He feels his memory is doing about the same however, the wife notes that he is having more difficulty doing tasks involving memory or focus.  Fortunately, he has no behavioral issues.   He sleeps well after a glass of wine every night.   Most days he sits around the house.   He does PT 3 times a week.  He and his wife are thinking about a move to a retirement village.    He no longer does projects around the house   He mostly sits on the patio.  He goes to PT twice a week.     He does not handle any financial tasks.    He does not drive.      He has no further episodes of being unresponsive.  MMSE - Mini Mental State Exam 10/17/2021  Orientation to time 2  Orientation to Place 3  Registration 0  Attention/ Calculation 1  Recall 0  Language- name 2 objects 2  Language- repeat 0  Language- follow 3 step command 1  Language- read & follow  direction 1  Write a sentence 0  Copy design 0  Total score 10      Imaging studies personally reviewed: MRI of the brain 11/08/2019 showed mild generalized cortical atrophy and minimal chronic microvascular ischemic changes in the hemispheres and pons.  Mild ethmoid chronic sinusitis.  There were no acute findings.  MRI cervical spine 09/25/20 showed mild spinal stenosis at C3C4 and C6C7.   Normal spinal cord signal.   MRI lumbar 12/20/2021showed  moderately severe spinal stenosis at 2 levels (L3-L4 and L4-L5) and there are pinched nerves at these levels.  NCV/EMG 10/19/2020: This NCV/EMG study shows the following: 1.   Moderate length dependent demyelinating and axonal polyneuropathy affecting the sensory and motor fibers more than the autonomic fibers..  There were no active features. 2.   Superimposed L3-S1 chronic radiculopathies.  This could be due to spinal stenosis.  Labs 09/21/2020:   SPEP/IEF showed polyclomal IgA elevation, HgbA1c was mildly elevated at 6.0   B12, SSA/SSB were fine.       Montreal Cognitive Assessment  02/14/2021 09/21/2020  Visuospatial/ Executive (0/5) 0 1  Naming (0/3) 3 3  Attention: Read list of digits (0/2) 1 2  Attention: Read list of letters (0/1) 0 0  Attention: Serial 7  subtraction starting at 100 (0/3) 2 3  Language: Repeat phrase (0/2) 1 2  Language : Fluency (0/1) 0 0  Abstraction (0/2) 2 1  Delayed Recall (0/5) 0 4  Orientation (0/6) 2 1  Total 11 17  Adjusted Score (based on education) 11 17     REVIEW OF SYSTEMS: Constitutional: No fevers, chills, sweats, or change in appetite Eyes: No visual changes, double vision, eye pain Ear, nose and throat: No hearing loss, ear pain, nasal congestion, sore throat Cardiovascular: No chest pain, palpitations Respiratory:  No shortness of breath at rest or with exertion.   No wheezes GastrointestinaI: No nausea, vomiting, diarrhea, abdominal pain, fecal incontinence Genitourinary:  No dysuria,  urinary retention or frequency.  No nocturia. Musculoskeletal:  No neck pain, back pain Integumentary: No rash, pruritus, skin lesions Neurological: as above Psychiatric: No depression at this time.  No anxiety Endocrine: No palpitations, diaphoresis, change in appetite, change in weigh or increased thirst Hematologic/Lymphatic:  No anemia, purpura, petechiae. Allergic/Immunologic: No itchy/runny eyes, nasal congestion, recent allergic reactions, rashes  ALLERGIES: No Known Allergies  HOME MEDICATIONS:  Current Outpatient Medications:    atorvastatin (LIPITOR) 20 MG tablet, TAKE 1 TABLET BY MOUTH  DAILY, Disp: 90 tablet, Rfl: 1   Calcium Carbonate-Vit D-Min (CALCIUM 1200 PO), Take 1 tablet by mouth in the morning and at bedtime., Disp: , Rfl:    Cyanocobalamin (VITAMIN B-12) 5000 MCG TBDP, Take 2,500 mcg by mouth daily., Disp: , Rfl:    folic acid (FOLVITE) 474 MCG tablet, Take 800 mcg by mouth daily., Disp: , Rfl:    furosemide (LASIX) 20 MG tablet, Take 20 mg by mouth daily as needed., Disp: , Rfl:    losartan (COZAAR) 50 MG tablet, TAKE 1 TABLET BY MOUTH  DAILY, Disp: 90 tablet, Rfl: 1   Multiple Vitamin (MULTIVITAMIN WITH MINERALS) TABS tablet, Take 1 tablet by mouth daily. Centrum, Disp: , Rfl:    Probiotic Product (PROBIOTIC PO), Take 1 Dose by mouth daily., Disp: , Rfl:    tamsulosin (FLOMAX) 0.4 MG CAPS capsule, Take 0.4 mg by mouth daily after supper., Disp: , Rfl:    donepezil (ARICEPT) 10 MG tablet, Take 1 tablet (10 mg total) by mouth at bedtime., Disp: 90 tablet, Rfl: 4   memantine (NAMENDA) 10 MG tablet, Take 1 tablet (10 mg total) by mouth 2 (two) times daily., Disp: 180 tablet, Rfl: 4  PAST MEDICAL HISTORY: Past Medical History:  Diagnosis Date   Allergy    Anemia 1996   due to a bleeding from aspirin   Arthritis    Cataract    History of blood transfusion 1996   HOH (hard of hearing)    uses hearing aids/ hearing on left side worse   Hyperlipidemia     Hypertension    Prediabetes 10/20/2014   Prostate cancer ([place])    Sleep apnea    uses c-pap   Spinal stenosis     PAST SURGICAL HISTORY: Past Surgical History:  Procedure Laterality Date   COLONOSCOPY  07/30/2019   COLONOSCOPY WITH PROPOFOL N/A 09/16/2018   Procedure: COLONOSCOPY WITH PROPOFOL;  Surgeon: Yetta Flock, MD;  [facility];  Service: Gastroenterology;  Laterality: N/A;   POLYPECTOMY  09/16/2018   Procedure: POLYPECTOMY;  Surgeon: Yetta Flock, MD;  [facility];  Service: Gastroenterology;;   POLYPECTOMY     SUBMUCOSAL INJECTION  09/16/2018   Procedure: SUBMUCOSAL INJECTION;  Surgeon: Yetta Flock, MD;  [facility];  Service:  Gastroenterology;;   TONSILLECTOMY     VASECTOMY      FAMILY HISTORY: Family History  Problem Relation Age of Onset   Diabetes Mother    Alzheimer's disease Mother    Prostate cancer Father 60       prostatectomy?   Diabetes Maternal Uncle    Stroke Paternal Uncle    Diabetes Maternal Grandmother    Heart disease Maternal Grandfather    Suicidality Brother    Colon cancer Neg Hx    Rectal cancer Neg Hx    Esophageal cancer Neg Hx    Stomach cancer Neg Hx    Colon polyps Neg Hx     SOCIAL HISTORY:  Social History   Socioeconomic History   Marital status: Married    Spouse name: Patsy   Number of children: 0   Years of education: college   Highest education level: Not on file  Occupational History   Occupation: retired, [employer]  Tobacco Use   Smoking status: Former    Packs/day: 1.00    Years: 5.00    Pack years: 5.00    Types: Cigarettes    Quit date: 01/29/1986    Years since quitting: 35.7   Smokeless tobacco: Never  Vaping Use   Vaping Use: Never used  Substance and Sexual Activity   Alcohol use: Yes    Alcohol/week: 7.0 standard drinks    Types: 7 Glasses of wine per week    Comment: 1 glass of wine with dinner   Drug use: No   Sexual  activity: Not Currently  Other Topics Concern   Not on file  Social History Narrative   Lives with wife   Household -- pt and wife   Caffeine use: 2 cups coffee per day   Right handed    Social Determinants of Health   Financial Resource Strain: Low Risk    Difficulty of Paying Living Expenses: Not hard at all  Food Insecurity: No Food Insecurity   Worried About Charity fundraiser in the Last Year: Never true   Arboriculturist in the Last Year: Never true  Transportation Needs: Not on file  Physical Activity: Insufficiently Active   Days of Exercise per Week: 3 days   Minutes of Exercise per Session: 30 min  Stress: Not on file  Social Connections: Unknown   Frequency of Communication with Friends and Family: Once a week   Frequency of Social Gatherings with Friends and Family: Once a week   Attends Religious Services: Patient refused   Marine scientist or Organizations: No   Attends Archivist Meetings: Never   Marital Status: Married  Human resources officer Violence: Not on file     PHYSICAL EXAM  Vitals:   10/17/21 1039  BP: (!) 128/58  Pulse: (!) 58  Weight: 214 lb (97.1 kg)  Height: 5\' 11"  (1.803 m)    Body mass index is 29.85 kg/m.   General: The patient is well-developed and well-nourished and in no acute distress  HEENT:  Head is [place]/AT.  Sclera are anicteric.   Skin: Extremities are without rash or  edema.  Neurologic Exam  Mental status: The patient is alert and oriented x 2 (5/10 on MMSE orientation questions) at the time of the examination. The patient has reduced short-term memory.  MMSE score was 10/30 consistent with  moderately severe dementia.      Speech is normal.  Cranial nerves: Extraocular movements are full. Pupils are  equal, round, and reactive to light and accomodation.  Normal facial strength/sensation. l. No dysarthria is noted.  Reduced hearing L worse then R noted.  Motor:  Muscle bulk is normal.   Tone is normal.  Strength is  5 / 5 in all 4 extremities except 4/5 EHL and 4+/5 ankle extensors.    Sensory: Sensory testing is intact to pinprick, soft touch and vibration sensation in the arms but reduced pinprick at ankles .   Very reduced vibration at ankles and near absent at toes, normal at knees..  Coordination: Cerebellar testing reveals good finger-nose-finger and mildly reduced heel-to-shin bilaterally.  Gait and station: Station is normal.   Gait has a reduced stride and is wide.  He is stooped forward.  He looks down while he walks, even with a cane.   He takes about 4 steps to turn 180 degrees.   He is unable to do a tandem walk.  Romberg is negative.   Reflexes: Deep tendon reflexes are symmetric and 1+  in arms, trace at the knees and absent at ankles.       DIAGNOSTIC DATA (LABS, IMAGING, TESTING) - I reviewed patient records, labs, notes, testing and imaging myself where available.  Lab Results  Component Value Date   WBC 10.3 04/06/2021   HGB 13.2 04/06/2021   HCT 39.5 04/06/2021   MCV 91.6 04/06/2021   PLT 167.0 04/06/2021      Component Value Date/Time   NA 142 10/06/2021 1147   NA 139 01/02/2018 0000   K 4.7 10/06/2021 1147   CL 104 10/06/2021 1147   CO2 32 10/06/2021 1147   GLUCOSE 105 (H) 10/06/2021 1147   BUN 24 (H) 10/06/2021 1147   BUN 21 01/02/2018 0000   CREATININE 0.94 10/06/2021 1147   CALCIUM 9.0 10/06/2021 1147   PROT 5.9 (L) 04/06/2021 1131   PROT 7.2 09/21/2020 1157   ALBUMIN 3.8 04/06/2021 1131   ALBUMIN 4.5 02/18/2021 1150   AST 12 04/06/2021 1131   ALT 22 04/06/2021 1131   ALKPHOS 55 04/06/2021 1131   BILITOT 0.8 04/06/2021 1131   GFRNONAA 110.80 07/25/2010 0933   GFRAA 110 06/26/2008 0948   Lab Results  Component Value Date   CHOL 205 (H) 04/06/2021   HDL 112.80 04/06/2021   LDLCALC 76 04/06/2021   TRIG 81.0 04/06/2021   CHOLHDL 2 04/06/2021   Lab Results  Component Value Date   HGBA1C 6.6 (H) 10/06/2021   Lab Results  Component Value  Date   VITAMINB12 >2000 (H) 09/21/2020   Lab Results  Component Value Date   TSH 5.22 10/06/2021       ASSESSMENT AND PLAN  Chronic inflammatory demyelinating polyneuritis (HCC)  Memory loss  Gait disturbance  Numbness   1.   He will continue donepezil and memantine.  We discussed trying to increase physical activity. 2.   He has a demyelinating greater than axonal polyneuropathy, probably mild chronic inflammatory demyelinating polyneuropathy.  He got no benefit from prednisone.  As he is stable, we will hold off trying other immunosuppressive therapies that may have more risk  3.   Use walker outside of home for safety.   Continue shower seat.  Discussed safety around his home due to polyneuropathy. 4.   rtc 12 months or sooner if new or worsening issues.     Geovanni Rahming A. [name], MD, [facility] 27/74/1287, 8:67 PM Certified in Neurology, Clinical Neurophysiology, Sleep Medicine and Neuroimaging  [facility] [address]  [place], [place] [address] [phone]

## 2021-10-18 DIAGNOSIS — M25659 Stiffness of unspecified hip, not elsewhere classified: Secondary | ICD-10-CM | POA: Diagnosis not present

## 2021-10-18 DIAGNOSIS — M6281 Muscle weakness (generalized): Secondary | ICD-10-CM | POA: Diagnosis not present

## 2021-10-18 DIAGNOSIS — R262 Difficulty in walking, not elsewhere classified: Secondary | ICD-10-CM | POA: Diagnosis not present

## 2021-10-18 DIAGNOSIS — R293 Abnormal posture: Secondary | ICD-10-CM | POA: Diagnosis not present

## 2021-11-10 ENCOUNTER — Other Ambulatory Visit: Payer: Self-pay | Admitting: Internal Medicine

## 2021-12-13 ENCOUNTER — Ambulatory Visit (INDEPENDENT_AMBULATORY_CARE_PROVIDER_SITE_OTHER): Payer: Medicare Other | Admitting: Pharmacist

## 2021-12-13 DIAGNOSIS — F03A0 Unspecified dementia, mild, without behavioral disturbance, psychotic disturbance, mood disturbance, and anxiety: Secondary | ICD-10-CM

## 2021-12-13 DIAGNOSIS — I10 Essential (primary) hypertension: Secondary | ICD-10-CM

## 2021-12-13 DIAGNOSIS — R269 Unspecified abnormalities of gait and mobility: Secondary | ICD-10-CM

## 2021-12-13 NOTE — Patient Instructions (Signed)
Martin Chase It was a pleasure speaking with you today.  I have attached a summary of our visit today and information about your health goals.    If you have any questions or concerns, please feel free to contact me either at the phone number below or with a MyChart message.   Keep up the good work!  Cherre Robins, PharmD Clinical Pharmacist [facility] [phone] (direct line)  [phone] (main office number)  CARE PLAN ENTRY (see longitudinal plan of care for additional care plan information)  Current Barriers:  Chronic Disease Management support, education, and care coordination needs related to Hypertension, Hyperlipidemia, and Dementia; CIDP; frequency falls and gait distrubance   Hypertension BP Readings from Last 3 Encounters:  10/17/21 (!) 128/58  10/06/21 116/64  04/18/21 (!) 135/58   Pharmacist Clinical Goal(s): Over the next 120 days, patient will work with PharmD and providers to maintain BP goal <140/90 Current regimen:  Losartan 50mg  take 1 tablet daily Interventions: Recommended home monitoring 2 to 3 times per week Patient self care activities - Over the next 120 days, patient will: Check blood pressure 2 to 3 times per week, document, and provide at future appointments Ensure daily salt intake < 2300 mg/day  Swelling in lower legs: Goal: decrease swelling in lower legs. Current regimen:  Furosemide 20mg  every daily (patient taking every OTHER day and as needed for swelling) Interventions:  Continue to elevate feet daily to help with swelling Limit daily salt intake < 2300 mg/day  Hyperlipidemia Lab Results  Component Value Date/Time   LDLCALC 76 04/06/2021 11:31 AM   LDLCALC 79 11/03/2019 10:44 AM   Pharmacist Clinical Goal(s): Over the next 120 days, patient will work with PharmD and providers to achieve LDL goal < 100 Current regimen:  Atorvastatin 20mg  daily  Interventions: Counseled on benefits of statin  therapy Patient self care activities - Over the next 120 days, patient will: Continue current medication for cholesterol  Dementia Pharmacist Clinical Goal(s) Over the next 120 days, patient will work with PharmD and providers to optimize medication and manage symptoms of dementia. Current regimen:  Donepezil 10mg  once daily at bedtime Menantine 10mg  twice a day Vitamin B12 5000 mcg daily Interventions: Discussed medication management with wife Patient self care activities - Over the next 120 days, patient will: Continue to focus on medication adherence using pill box Take donepezil with food to decrease diarrhea Continue current therapy for dementia   Gait instability / falls / chronic inflammatory demyelinating polyneuritis:  Pharmacist Clinical Goal(s) Over the next 120 days, patient will work with PharmD and providers to optimize medication and reduce falls Current regimen:  Physical therapy  Interventions: Reviewed medications for potential to increase falls.   Patient self care activities - Over the next 120 days, patient will:  Continue current therapy and follow up with Dr [name] Continue with physical therapy Continue to use walker, shower chair and lift chair.     Medication management Pharmacist Clinical Goal(s): Over the next 120 days, patient will work with PharmD and providers to maintain optimal medication adherence Current pharmacy: [facility] mail order Interventions Comprehensive medication review performed. Continue current medication management strategy Patient self care activities - Over the next 120 days, patient will: Focus on medication adherence by pill box Take medications as prescribed Report any questions or concerns to PharmD and/or provider(s)  Please see past updates related to this goal by clicking on the "Past Updates" button in the selected goal  Patient verbalizes understanding of instructions and care plan provided today and agrees to  view in [place]. Active MyChart status confirmed with patient.

## 2021-12-13 NOTE — Chronic Care Management (AMB) (Signed)
Chronic Care Management Pharmacy Note  12/13/2021 Name:  Martin Chase MRN:  [id number] DOB:  06-02-1946  Summary: Patient is doing well. He has restarted furosemide 4m every OTHER day due to edema. Rash at the top of his bottom has resolved - using Aloe Vesta cream   Wife asked if patient would qualify for home PT - will check with PCP team [facility] [place](might need to establish with PCP closer to her)      Subjective: Martin WYNNis an 76y.o. year old male who is a primary patient of Paz, JAlda Berthold MD.  The CCM team was consulted for assistance with disease management and care coordination needs.    Engaged with patient's wife / caregiver by telephone for follow up visit in response to provider referral for pharmacy case management and/or care coordination services.   Consent to Services:  The patient was given information about Chronic Care Management services, agreed to services, and gave verbal consent prior to initiation of services.  Please see initial visit note for detailed documentation.   Patient Care Team: PColon Branch MD as PCP - General (Internal Medicine) KBerle Mull MD as Consulting Physician (Sports Medicine) Armbruster, SCarlota Raspberry MD as Consulting Physician (Gastroenterology) EFestus Aloe MD as Consulting Physician (Urology) RCira Rue RN Nurse Navigator as Registered Nurse ([place] SFelecia Shelling RNanine Means MD (Neurology) NConsuella Lose MD as Consulting Physician (Neurosurgery) ECherre Robins RLuis M. Cintron(Pharmacist)  Recent office visits: 10/06/2021 - PCP (Dr PLarose Kells Routine check up. Noted pressure ulcer grade 1 on buttock. Recommended donut pill and frequent turning. Labs checked.    Recent consult visits: [facility]21 [facility] [place]. Determined hearing aid dome lying on inferior medial portion of ear canal. Removed.  10/17/2021 - Neurology (Dr SFelecia Shelling  Seen for chronic inflammatory demyelinating poluneuritis. Patient reports gait better. Using walker or cane. No medication changes. Recommended increase physical activity. F/U 12 months.  [facility] VC[facility] Clement [facility] Started furosemide 221mdaily for 3 days then as needed for lower extremity edema.    Hospital visits: None in previous 6 months  Objective:  Lab Results  Component Value Date   CREATININE 0.94 10/06/2021   CREATININE 1.09 04/06/2021   CREATININE 0.84 10/06/2020    Lab Results  Component Value Date   HGBA1C 6.6 (H) 10/06/2021   Last diabetic Eye exam: No results found for: HMDIABEYEEXA  Last diabetic Foot exam: No results found for: HMDIABFOOTEX      Component Value Date/Time   CHOL 205 (H) 04/06/2021 1131   CHOL 164 11/03/2019 1044   TRIG 81.0 04/06/2021 1131   HDL 112.80 04/06/2021 1131   HDL 64 11/03/2019 1044   CHOLHDL 2 04/06/2021 1131   VLDL 16.2 04/06/2021 1131   LDLCALC 76 04/06/2021 1131   LDLCALC 79 11/03/2019 1044    Hepatic Function Latest Ref Rng & Units 04/06/2021 02/18/2021 10/06/2020  Total Protein 6.0 - 8.3 g/dL 5.9(L) - 7.0  Albumin 3.5 - 5.2 g/dL 3.8 4.5 4.2  AST 0 - 37 U/L 12 - 22  ALT 0 - 53 U/L 22 - 12  Alk Phosphatase 39 - 117 U/L 55 - 72  Total Bilirubin 0.2 - 1.2 mg/dL 0.8 - 0.6  Bilirubin, Direct 0.01 - 0.4 - - -    Lab Results  Component Value Date/Time   TSH 5.22 10/06/2021 11:47  AM   TSH 3.36 04/06/2021 11:31 AM   FREET4 0.60 10/06/2021 11:47 AM   FREET4 0.76 04/06/2021 11:31 AM    CBC Latest Ref Rng & Units 04/06/2021 11/03/2019 05/01/2019  WBC 4.0 - 10.5 K/uL 10.3 6.6 9.0  Hemoglobin 13.0 - 17.0 g/dL 13.2 12.9(L) 13.0  Hematocrit 39.0 - 52.0 % 39.5 39.0 38.6(L)  Platelets 150.0 - 400.0 K/uL 167.0 241.0 227.0    No results found for: VD25OH  Clinical ASCVD: No  The ASCVD Risk score (Arnett DK, et al., 2019) failed to calculate for the following reasons:   The valid HDL cholesterol range is 20 to 100  mg/dL     Social History   Tobacco Use  Smoking Status Former   Packs/day: 1.00   Years: 5.00   Pack years: 5.00   Types: Cigarettes   Quit date: 01/29/1986   Years since quitting: 35.8  Smokeless Tobacco Never   BP Readings from Last 3 Encounters:  10/17/21 (!) 128/58  10/06/21 116/64  04/18/21 (!) 135/58   Pulse Readings from Last 3 Encounters:  10/17/21 (!) 58  10/06/21 61  04/18/21 71   Wt Readings from Last 3 Encounters:  10/17/21 214 lb (97.1 kg)  10/06/21 213 lb 4 oz (96.7 kg)  04/18/21 209 lb 8 oz (95 kg)    Assessment: Review of patient past medical history, allergies, medications, health status, including review of consultants reports, laboratory and other test data, was performed as part of comprehensive evaluation and provision of chronic care management services.   SDOH:  (Social Determinants of Health) assessments and interventions performed:  SDOH Interventions    Flowsheet Row Most Recent Value  SDOH Interventions   Financial Strain Interventions Intervention Not Indicated  Physical Activity Interventions Other (Comments)  [continue physical therapy]        CCM Care Plan  No Known Allergies  Medications Reviewed Today     Reviewed by Cherre Robins, RPH-CPP (Pharmacist) on 12/13/21 at 11  Med List Status: <None>   Medication Order Taking? Sig Documenting Provider Last Dose Status Informant  atorvastatin (LIPITOR) 20 MG tablet [id number] Yes TAKE 1 TABLET BY MOUTH  DAILY Colon Branch, MD Taking Active   Calcium Carbonate-Vit D-Min (CALCIUM 1200 PO) [id number] Yes Take 1 tablet by mouth in the morning and at bedtime. [provider] Taking Active   Cyanocobalamin (VITAMIN B-12) 5000 MCG TBDP [id number] Yes Take 2,500 mcg by mouth daily. [provider] Taking Active Self  donepezil (ARICEPT) 10 MG tablet [id number] Yes Take 1 tablet (10 mg total) by mouth at bedtime. Sater, Nanine Means, MD Taking Active   folic acid (FOLVITE) 824 MCG  tablet [id number] Yes Take 800 mcg by mouth daily. [provider] Taking Active   furosemide (LASIX) 20 MG tablet [id number] Yes Take 20 mg by mouth daily as needed. [provider] Taking Active   losartan (COZAAR) 50 MG tablet [id number] Yes TAKE 1 TABLET BY MOUTH  DAILY Colon Branch, MD Taking Active   memantine [facility]) 10 MG tablet [id number] Yes Take 1 tablet (10 mg total) by mouth 2 (two) times daily. Sater, Nanine Means, MD Taking Active   Multiple Vitamin (MULTIVITAMIN WITH MINERALS) TABS tablet [id number] Yes Take 1 tablet by mouth daily. Centrum [provider] Taking Active Self  Probiotic Product (PROBIOTIC PO) [id number] Yes Take 1 Dose by mouth daily. [provider] Taking Active   tamsulosin (FLOMAX) 0.4 MG CAPS capsule [id number] Yes Take 0.4 mg by mouth daily  after supper. [provider] Taking Active   Med List Note Sharene Butters, CPhT 09/10/18 1059): CPAP MACHINE AT NIGHT.            Patient Active Problem List   Diagnosis Date Noted   Pressure injury of buttock, stage 1 10/06/2021   Chronic inflammatory demyelinating polyneuritis ([place]) 04/18/2021   Spinal stenosis 11/09/2020   Gait disturbance 09/21/2020   Polyneuropathy 09/21/2020   Memory loss 09/21/2020   Numbness 09/21/2020   Chronic midline low back pain 04/04/2020   Mild dementia 11/04/2019   Prostate cancer ([place]) 01/06/2019   Benign neoplasm of colon    Hypertension    PCP NOTES >>>>>>>>>>>>>>>>>>>>>>>>>>> 01/31/2016   Annual physical exam 10/20/2014   Hyperglycemia 10/20/2014   Osteoarthritis 01/30/2011   Erectile dysfunction 01/30/2011   Hyperlipidemia 08/23/2007    Immunization History  Administered Date(s) Administered   Influenza Split 09/22/2011, 08/25/2014   Influenza Whole 08/23/2007, 07/09/2009, 07/25/2010   Influenza, High Dose Seasonal PF 08/03/2017, 08/05/2018, 08/06/2019, 08/06/2019, 08/05/2020   Influenza-Unspecified 08/17/2015, 09/06/2021    PFIZER(Purple Top)SARS-COV-2 Vaccination 11/25/2019, 12/16/2019, 08/23/2020, 12/15/2020   Pneumococcal Conjugate-13 10/20/2014   Pneumococcal Polysaccharide-23 07/03/2008, 08/03/2017   Td 07/23/2009   Tdap 11/03/2019   Zoster Recombinat (Shingrix) 07/07/2020, 09/23/2020   Zoster, Live 03/25/2011    Conditions to be addressed/monitored: HTN, HLD, Dementia and pre DM; BPH; gait distrubance; CIDP; frequent falls  Care Plan : General Pharmacy (Adult)  Updates made by Cherre Robins, RPH-CPP since 12/13/2021 12:00 AM     Problem: medication and chronic care management   Priority: High  Onset Date: 04/12/2021     Long-Range Goal: Provide education, support and care coordination for medication therapy and chronic conditions   Start Date: 04/12/2021  Recent Progress: On track  Priority: High  Note:   Current Barriers:  Unable to self administer medications as prescribed (patient's wife assists with medication administration) Chronic Disease Management support, education, and care coordination needs related to Hypertension, Hyperlipidemia, and Dementia  Pharmacist Clinical Goal(s):  Over the next 180 days, patient will maintain control of HTN, hyperlipidemia and pre DM as evidenced by attaining goals below  through collaboration with PharmD and provider.  Ensure that patient's caregiver is supported  Interventions: 1:1 collaboration with Colon Branch, MD regarding development and update of comprehensive plan of care as evidenced by provider attestation and co-signature Inter-disciplinary care team collaboration (see longitudinal plan of care) Comprehensive medication review performed; medication list updated in electronic medical record  Hypertension Controlled; BP goal <140/90 Denies dizziness Occasional weakness thought to be related to CIDP Receiving physical therapy 2 to 3 times per week (has suspended for last month but patient would like to restart)  Current regimen:  Losartan  66m take 1 tablet daily Interventions: Recommended home monitoring 2 to 3 times per week; document, and provide at future appointments Ensure daily salt intake < 2300 mg/day Verified that losartan has been refilled by [facility]  = 90 days on 12/09/2021  [facility]. Recommended patient restart furosemide 277mevery OTHER day.  Elevating feet / legs once daily, most days Current regimen:  Furosemide 2069maily if needed for swelling (patient taking every OTHER day) Denies shortness of breath  Interventions:  Continue to elevated feet once daily Continue to use furosemide 49m33mery other day and if needed for swelling  Limit daily salt intake < 2300 mg/day  Hyperlipidemia At goal; LDL goal < 100 Current regimen:  Atorvastatin 53m daily  Interventions: Counseled on benefits of statin therapy Continue current medication for cholesterol  Dementia Stable; Goal: optimize medication and manage symptoms of dementia. [name] reports today that memory is stable Managed byDr Sater [facility] Anticipated move in date May 2023.  Patient was having increase loose stools at our last visit 09/2021 but today wife reports loose stools are rare.  Current regimen:  Donepezil 125monce daily at bedtime Menantine 1084mwice a day Vitamin B12 5000m70maily Interventions: Discussed medication management with wife Reminded to take donepezil with food to lessen diarrhea.  Continue current therapy for dementia  Gait instability / falls / chronic inflammatory demyelinating polyneuritis:  A little improvement; no recent falls Patient noted to have pre DM (?diabetes). Last A1c was 6.6% Patient did not see improvement with prednisone has been stopped.  [name] still interested in getting second opinion about Parkinson's Disease / movement disorder.   [facility] but cancelled until after they move to assisted living in May 2023. Current regimen:  Physical therapy (suspended for the last month but patient interested in restarting Interventions: Reviewed medications for potential to increase falls.  BP and BPH medications can cause dizziness but patient denies dizziness Continue physical therapy - wife asks about home physical therapy. He might need face to face prior to order from PCP. Will check with CNA Continue to use walker, shower chair and lift chair.   Medication management Current pharmacy: [facility] Interventions Comprehensive medication review performed. Continue current medication management strategy Reminded to refill atorvastatin and losartan (confirmed that [facility] has filled 12/09/2021)   Acute need:  Mrs. IslePepperorts rash at the top of patient's bottom has resolved - she is using Aloe Vesta cream.    Patient Goals/Self-Care Activities Over the next 180 days, patient will:  take medications as prescribed, check blood pressure 2 to 3 times per week, document, and provide at future appointments, and notify provider of symptom of hyperglycemia         Medication Assistance: None required.  Patient affirms current coverage meets needs.  Patient's [place] pharmacy is:  [place] [address]: [phone]: [phone] ([facility]: [phone]: [phone] [facility] ([facility] ) - [place]: [phone]: [phone] Up:  Patient agrees to Care Plan and Follow-up.  Plan: The care management team will  reach out to the patient again over the next 90 days.    TammCherre RobinsarmD Clinical Pharmacist [phone]

## 2021-12-14 ENCOUNTER — Encounter: Payer: Self-pay | Admitting: Neurology

## 2021-12-19 ENCOUNTER — Ambulatory Visit (INDEPENDENT_AMBULATORY_CARE_PROVIDER_SITE_OTHER): Payer: Medicare Other

## 2021-12-19 VITALS — Ht 71.0 in | Wt 214.0 lb

## 2021-12-19 DIAGNOSIS — Z0000 Encounter for general adult medical examination without abnormal findings: Secondary | ICD-10-CM

## 2021-12-19 NOTE — Patient Instructions (Signed)
Martin Chase , Thank you for taking time to complete your Medicare Wellness Visit. I appreciate your ongoing commitment to your health goals. Please review the following plan we discussed and let me know if I can assist you in the future.   Screening recommendations/referrals: Colonoscopy: Completed 06/22/2020-Due 06/23/2023 Recommended yearly ophthalmology/optometry visit for glaucoma screening and checkup Recommended yearly dental visit for hygiene and checkup  Vaccinations: Influenza vaccine: Up to date Pneumococcal vaccine: Up to date Tdap vaccine: Up to date Shingles vaccine: Completed vaccines   Covid-19: Booster available at the pharmacy  Advanced directives: Copy in chart  Conditions/risks identified: See problem list  Next appointment: Follow up in one year for your annual wellness visit.   Preventive Care 78 Years and Older, Male Preventive care refers to lifestyle choices and visits with your health care provider that can promote health and wellness. What does preventive care include? A yearly physical exam. This is also called an annual well check. Dental exams once or twice a year. Routine eye exams. Ask your health care provider how often you should have your eyes checked. Personal lifestyle choices, including: Daily care of your teeth and gums. Regular physical activity. Eating a healthy diet. Avoiding tobacco and drug use. Limiting alcohol use. Practicing safe sex. Taking low doses of aspirin every day. Taking vitamin and mineral supplements as recommended by your health care provider. What happens during an annual well check? The services and screenings done by your health care provider during your annual well check will depend on your age, overall health, lifestyle risk factors, and family history of disease. Counseling  Your health care provider may ask you questions about your: Alcohol use. Tobacco use. Drug use. Emotional well-being. Home and relationship  well-being. Sexual activity. Eating habits. History of falls. Memory and ability to understand (cognition). Work and work Statistician. Screening  You may have the following tests or measurements: Height, weight, and BMI. Blood pressure. Lipid and cholesterol levels. These may be checked every 5 years, or more frequently if you are over 78 years old. Skin check. Lung cancer screening. You may have this screening every year starting at age 78 if you have a 30-pack-year history of smoking and currently smoke or have quit within the past 15 years. Fecal occult blood test (FOBT) of the stool. You may have this test every year starting at age 78. Flexible sigmoidoscopy or colonoscopy. You may have a sigmoidoscopy every 5 years or a colonoscopy every 10 years starting at age 78. Prostate cancer screening. Recommendations will vary depending on your family history and other risks. Hepatitis C blood test. Hepatitis B blood test. Sexually transmitted disease (STD) testing. Diabetes screening. This is done by checking your blood sugar (glucose) after you have not eaten for a while (fasting). You may have this done every 1-3 years. Abdominal aortic aneurysm (AAA) screening. You may need this if you are a current or former smoker. Osteoporosis. You may be screened starting at age 78 if you are at high risk. Talk with your health care provider about your test results, treatment options, and if necessary, the need for more tests. Vaccines  Your health care provider may recommend certain vaccines, such as: Influenza vaccine. This is recommended every year. Tetanus, diphtheria, and acellular pertussis (Tdap, Td) vaccine. You may need a Td booster every 10 years. Zoster vaccine. You may need this after age 78. Pneumococcal 13-valent conjugate (PCV13) vaccine. One dose is recommended after age 78. Pneumococcal polysaccharide (PPSV23) vaccine. One dose is  recommended after age 78. Talk to your health care  provider about which screenings and vaccines you need and how often you need them. This information is not intended to replace advice given to you by your health care provider. Make sure you discuss any questions you have with your health care provider. Document Released: 11/19/2015 Document Revised: 07/12/2016 Document Reviewed: 08/24/2015 Elsevier Interactive Patient Education  2017 [place] Prevention in the Home Falls can cause injuries. They can happen to people of all ages. There are many things you can do to make your home safe and to help prevent falls. What can I do on the outside of my home? Regularly fix the edges of walkways and driveways and fix any cracks. Remove anything that might make you trip as you walk through a door, such as a raised step or threshold. Trim any bushes or trees on the path to your home. Use bright outdoor lighting. Clear any walking paths of anything that might make someone trip, such as rocks or tools. Regularly check to see if handrails are loose or broken. Make sure that both sides of any steps have handrails. Any raised decks and porches should have guardrails on the edges. Have any leaves, snow, or ice cleared regularly. Use sand or salt on walking paths during winter. Clean up any spills in your garage right away. This includes oil or grease spills. What can I do in the bathroom? Use night lights. Install grab bars by the toilet and in the tub and shower. Do not use towel bars as grab bars. Use non-skid mats or decals in the tub or shower. If you need to sit down in the shower, use a plastic, non-slip stool. Keep the floor dry. Clean up any water that spills on the floor as soon as it happens. Remove soap buildup in the tub or shower regularly. Attach bath mats securely with double-sided non-slip rug tape. Do not have throw rugs and other things on the floor that can make you trip. What can I do in the bedroom? Use night lights. Make  sure that you have a light by your bed that is easy to reach. Do not use any sheets or blankets that are too big for your bed. They should not hang down onto the floor. Have a firm chair that has side arms. You can use this for support while you get dressed. Do not have throw rugs and other things on the floor that can make you trip. What can I do in the kitchen? Clean up any spills right away. Avoid walking on wet floors. Keep items that you use a lot in easy-to-reach places. If you need to reach something above you, use a strong step stool that has a grab bar. Keep electrical cords out of the way. Do not use floor polish or wax that makes floors slippery. If you must use wax, use non-skid floor wax. Do not have throw rugs and other things on the floor that can make you trip. What can I do with my stairs? Do not leave any items on the stairs. Make sure that there are handrails on both sides of the stairs and use them. Fix handrails that are broken or loose. Make sure that handrails are as long as the stairways. Check any carpeting to make sure that it is firmly attached to the stairs. Fix any carpet that is loose or worn. Avoid having throw rugs at the top or bottom of the stairs. If  you do have throw rugs, attach them to the floor with carpet tape. Make sure that you have a light switch at the top of the stairs and the bottom of the stairs. If you do not have them, ask someone to add them for you. What else can I do to help prevent falls? Wear shoes that: Do not have high heels. Have rubber bottoms. Are comfortable and fit you well. Are closed at the toe. Do not wear sandals. If you use a stepladder: Make sure that it is fully opened. Do not climb a closed stepladder. Make sure that both sides of the stepladder are locked into place. Ask someone to hold it for you, if possible. Clearly mark and make sure that you can see: Any grab bars or handrails. First and last steps. Where the  edge of each step is. Use tools that help you move around (mobility aids) if they are needed. These include: Canes. Walkers. Scooters. Crutches. Turn on the lights when you go into a dark area. Replace any light bulbs as soon as they burn out. Set up your furniture so you have a clear path. Avoid moving your furniture around. If any of your floors are uneven, fix them. If there are any pets around you, be aware of where they are. Review your medicines with your doctor. Some medicines can make you feel dizzy. This can increase your chance of falling. Ask your doctor what other things that you can do to help prevent falls. This information is not intended to replace advice given to you by your health care provider. Make sure you discuss any questions you have with your health care provider. Document Released: 08/19/2009 Document Revised: 03/30/2016 Document Reviewed: 11/27/2014 Elsevier Interactive Patient Education  2017 [employer].

## 2021-12-19 NOTE — Progress Notes (Addendum)
Subjective:   Martin Chase is a 76 y.o. male who presents for Medicare Annual/Subsequent preventive examination.  I connected with Cali today by telephone and verified that I am speaking with the correct person using two identifiers. Location patient: home Location provider: work Persons participating in the virtual visit: patient,wife, Marine scientist.    I discussed the limitations, risks, security and privacy concerns of performing an evaluation and management service by telephone and the availability of in person appointments. I also discussed with the patient that there may be a patient responsible charge related to this service. The patient expressed understanding and verbally consented to this telephonic visit.    Interactive audio and video telecommunications were attempted between this provider and patient, however failed, due to patient having technical difficulties OR patient did not have access to video capability.  We continued and completed visit with audio only.  Some vital signs may be absent or patient reported.   Time Spent with patient on telephone encounter: 25 minutes  [name] answered questions today. Patient has a diagnosis of memory loss.   Review of Systems     Cardiac Risk Factors include: advanced age (>74men, >64 women);male gender;hypertension;dyslipidemia     Objective:    Today's Vitals   12/19/21 1505  Weight: 214 lb (97.1 kg)  Height: 5\' 11"  (1.803 m)   Body mass index is 29.85 kg/m.  Advanced Directives 12/19/2021 08/07/2019 09/16/2018 09/12/2018 08/05/2018 08/03/2017  Does Patient Have a Medical Advance Directive? Yes No No No Yes Yes  Type of Paramedic of [place];Living will - - - [place];Living will [place];Living will  Copy of [place] in Chart? No - copy requested - - - No - copy requested No - copy requested  Would patient like information on creating a  medical advance directive? - No - Patient declined No - Patient declined - - -    Current Medications (verified) Outpatient Encounter Medications as of 12/19/2021  Medication Sig   atorvastatin (LIPITOR) 20 MG tablet TAKE 1 TABLET BY MOUTH  DAILY   Calcium Carbonate-Vit D-Min (CALCIUM 1200 PO) Take 1 tablet by mouth in the morning and at bedtime.   Cyanocobalamin (VITAMIN B-12) 5000 MCG TBDP Take 2,500 mcg by mouth daily.   donepezil (ARICEPT) 10 MG tablet Take 1 tablet (10 mg total) by mouth at bedtime.   folic acid (FOLVITE) 741 MCG tablet Take 800 mcg by mouth daily.   furosemide (LASIX) 20 MG tablet Take 20 mg by mouth daily as needed.   losartan (COZAAR) 50 MG tablet TAKE 1 TABLET BY MOUTH  DAILY   memantine (NAMENDA) 10 MG tablet Take 1 tablet (10 mg total) by mouth 2 (two) times daily.   Multiple Vitamin (MULTIVITAMIN WITH MINERALS) TABS tablet Take 1 tablet by mouth daily. Centrum   Probiotic Product (PROBIOTIC PO) Take 1 Dose by mouth daily.   tamsulosin (FLOMAX) 0.4 MG CAPS capsule Take 0.4 mg by mouth daily after supper.   No facility-administered encounter medications on file as of 12/19/2021.    Allergies (verified) Patient has no known allergies.   History: Past Medical History:  Diagnosis Date   Allergy    Anemia 1996   due to a bleeding from aspirin   Arthritis    Cataract    History of blood transfusion 1996   HOH (hard of hearing)    uses hearing aids/ hearing on left side worse   Hyperlipidemia  Hypertension    Prediabetes 10/20/2014   Prostate cancer [facility])    Sleep apnea    uses c-pap   Spinal stenosis    Past Surgical History:  Procedure Laterality Date   COLONOSCOPY  07/30/2019   COLONOSCOPY WITH PROPOFOL N/A 09/16/2018   Procedure: COLONOSCOPY WITH PROPOFOL;  Surgeon: Yetta Flock, MD;  Location: [facility];  Service: Gastroenterology;  Laterality: N/A;   POLYPECTOMY  09/16/2018   Procedure: POLYPECTOMY;  Surgeon: Yetta Flock,  MD;  Location: [facility];  Service: Gastroenterology;;   POLYPECTOMY     SUBMUCOSAL INJECTION  09/16/2018   Procedure: SUBMUCOSAL INJECTION;  Surgeon: Yetta Flock, MD;  Location: [facility];  Service: Gastroenterology;;   TONSILLECTOMY     VASECTOMY     Family History  Problem Relation Age of Onset   Diabetes Mother    Alzheimer's disease Mother    Prostate cancer Father 63       prostatectomy?   Diabetes Maternal Uncle    Stroke Paternal Uncle    Diabetes Maternal Grandmother    Heart disease Maternal Grandfather    Suicidality Brother    Colon cancer Neg Hx    Rectal cancer Neg Hx    Esophageal cancer Neg Hx    Stomach cancer Neg Hx    Colon polyps Neg Hx    Social History   Socioeconomic History   Marital status: Married    Spouse name: Patsy   Number of children: 0   Years of education: college   Highest education level: Not on file  Occupational History   Occupation: retired, [employer]  Tobacco Use   Smoking status: Former    Packs/day: 1.00    Years: 5.00    Pack years: 5.00    Types: Cigarettes    Quit date: 01/29/1986    Years since quitting: 35.9   Smokeless tobacco: Never  Vaping Use   Vaping Use: Never used  Substance and Sexual Activity   Alcohol use: Yes    Alcohol/week: 7.0 standard drinks    Types: 7 Glasses of wine per week    Comment: 1 glass of wine with dinner   Drug use: No   Sexual activity: Not Currently  Other Topics Concern   Not on file  Social History Narrative   Lives with wife   Household -- pt and wife   Caffeine use: 2 cups coffee per day   Right handed    Social Determinants of Health   Financial Resource Strain: Low Risk    Difficulty of Paying Living Expenses: Not hard at all  Food Insecurity: No Food Insecurity   Worried About Charity fundraiser in the Last Year: Never true   Ran Out of Food in the Last Year: Never true  Transportation Needs: No Transportation Needs   Lack of  Transportation (Medical): No   Lack of Transportation (Non-Medical): No  Physical Activity: Insufficiently Active   Days of Exercise per Week: 3 days   Minutes of Exercise per Session: 30 min  Stress: Not on file  Social Connections: Socially Isolated   Frequency of Communication with Friends and Family: Never   Frequency of Social Gatherings with Friends and Family: Once a week   Attends Religious Services: Never   Marine scientist or Organizations: No   Attends Archivist Meetings: Never   Marital Status: Married    Tobacco Counseling Counseling given: Not Answered   Clinical Intake:  Airline pilot  completed: Yes  Pain : No/denies pain     BMI - recorded: 29.85 Nutritional Status: BMI 25 -29 Overweight Nutritional Risks: None Diabetes: No  How often do you need to have someone help you when you read instructions, pamphlets, or other written materials from your doctor or pharmacy?: 1 - Never  Diabetic?No  Interpreter Needed?: No  Information entered by :: Caroleen Hamman LPN   Activities of Daily Living In your present state of health, do you have any difficulty performing the following activities: 12/19/2021 02/01/2021  Hearing? Y N  Comment hearing aids -  Vision? N N  Difficulty concentrating or making decisions? Tempie Donning  Comment sees neurology -  Walking or climbing stairs? Y Y  Comment stairs -  Dressing or bathing? Y N  Doing errands, shopping? Tempie Donning  Preparing Food and eating ? Y -  Using the Toilet? N -  In the past six months, have you accidently leaked urine? N -  Do you have problems with loss of bowel control? N -  Managing your Medications? Y -  Managing your Finances? Y -  Housekeeping or managing your Housekeeping? Y -  Some recent data might be hidden    Patient Care Team: Colon Branch, MD as PCP - General (Internal Medicine) Berle Mull, MD as Consulting Physician (Sports Medicine) Armbruster, Carlota Raspberry, MD as  Consulting Physician (Gastroenterology) Festus Aloe, MD as Consulting Physician (Urology) Cira Rue, RN Nurse Navigator as Registered Nurse (Medical Oncology) [name], Nanine Means, MD (Neurology) Consuella Lose, MD as Consulting Physician (Neurosurgery) Cherre Robins, [place] (Pharmacist)  Indicate any recent Medical Services you may have received from other than [facility] providers in the past year (date may be approximate).     Assessment:   This is a routine wellness examination for [employer].  Hearing/Vision screen Hearing Screening - Comments:: Bilateral hearing aids Vision Screening - Comments:: Last eye exam-02/2021-Dr. McCuen  Dietary issues and exercise activities discussed: Current Exercise Habits: The patient does not participate in regular exercise at present   Goals Addressed             This Visit's Progress    DIET - INCREASE WATER INTAKE   On track      Depression Screen PHQ 2/9 Scores 12/19/2021 10/06/2021 04/06/2021 10/06/2020 08/07/2019 08/05/2018 08/03/2017  PHQ - 2 Score - 0 0 0 0 0 0  Exception Documentation Other- indicate reason in comment box - - - - - -    Fall Risk Fall Risk  12/19/2021 10/06/2021 04/06/2021 10/06/2020 08/07/2019  Falls in the past year? 0 0 1 0 0  Number falls in past yr: 0 0 1 0 -  Injury with Fall? 0 0 0 0 -  Follow up Falls prevention discussed Falls evaluation completed Falls evaluation completed - -    FALL RISK PREVENTION PERTAINING TO THE HOME:  Any stairs in or around the home? Yes  If so, are there any without handrails? No  Home free of loose throw rugs in walkways, pet beds, electrical cords, etc? Yes  Adequate lighting in your home to reduce risk of falls? Yes   ASSISTIVE DEVICES UTILIZED TO PREVENT FALLS:  Life alert? No  Use of a cane, walker or w/c? Yes  Grab bars in the bathroom? No  Shower chair or bench in shower? Yes  Elevated toilet seat or a handicapped toilet? No   TIMED UP AND GO:  Was the test performed?  No . Phone visit   Cognitive  Function:Patient has current diagnosis of cognitive impairment. Patient is followed by neurology for ongoing assessment.   MMSE - Mini Mental State Exam 10/17/2021  Orientation to time 2  Orientation to Place 3  Registration 0  Attention/ Calculation 1  Recall 0  Language- name 2 objects 2  Language- repeat 0  Language- follow 3 step command 1  Language- read & follow direction 1  Write a sentence 0  Copy design 0  Total score 10   Montreal Cognitive Assessment  02/14/2021 09/21/2020  Visuospatial/ Executive (0/5) 0 1  Naming (0/3) 3 3  Attention: Read list of digits (0/2) 1 2  Attention: Read list of letters (0/1) 0 0  Attention: Serial 7 subtraction starting at 100 (0/3) 2 3  Language: Repeat phrase (0/2) 1 2  Language : Fluency (0/1) 0 0  Abstraction (0/2) 2 1  Delayed Recall (0/5) 0 4  Orientation (0/6) 2 1  Total 11 17  Adjusted Score (based on education) 11 17      Immunizations Immunization History  Administered Date(s) Administered   Influenza Split 09/22/2011, 08/25/2014   Influenza Whole 08/23/2007, 07/09/2009, 07/25/2010   Influenza, High Dose Seasonal PF 08/03/2017, 08/05/2018, 08/06/2019, 08/06/2019, 08/05/2020   Influenza-Unspecified 08/17/2015, 09/06/2021   PFIZER(Purple Top)SARS-COV-2 Vaccination 11/25/2019, 12/16/2019, 08/23/2020, 12/15/2020   Pneumococcal Conjugate-13 10/20/2014   Pneumococcal Polysaccharide-23 07/03/2008, 08/03/2017   Td 07/23/2009   Tdap 11/03/2019   Zoster Recombinat (Shingrix) 07/07/2020, 09/23/2020   Zoster, Live 03/25/2011    TDAP status: Up to date  Flu Vaccine status: Up to date  Pneumococcal vaccine status: Up to date  Covid-19 vaccine status: Information provided on how to obtain vaccines.   Qualifies for Shingles Vaccine? No   Zostavax completed Yes   Shingrix Completed?: Yes  Screening Tests Health Maintenance  Topic Date Due   COVID-19 Vaccine (5 - Booster for Pfizer series)  02/09/2021   COLONOSCOPY (Pts 45-74yrs Insurance coverage will need to be confirmed)  06/23/2023   TETANUS/TDAP  11/02/2029   Pneumonia Vaccine 25+ Years old  Completed   INFLUENZA VACCINE  Completed   Hepatitis C Screening  Completed   Zoster Vaccines- Shingrix  Completed   HPV VACCINES  Aged Out    Health Maintenance  Health Maintenance Due  Topic Date Due   COVID-19 Vaccine (5 - Booster for [place] series) 02/09/2021    Colorectal cancer screening: Type of screening: Colonoscopy. Completed 06/22/2020. Repeat every 3 years  Lung Cancer Screening: (Low Dose CT Chest recommended if Age 27-80 years, 30 pack-year currently smoking OR have quit w/in 15years.) does not qualify.     Additional Screening:  Hepatitis C Screening: Completed 01/31/2016  Vision Screening: Recommended annual ophthalmology exams for early detection of glaucoma and other disorders of the eye. Is the patient up to date with their annual eye exam?  Yes  Who is the provider or what is the name of the office in which the patient attends annual eye exams? Dr. Ellie Lunch   Dental Screening: Recommended annual dental exams for proper oral hygiene  Community Resource Referral / Chronic Care Management: CRR required this visit?  No   CCM required this visit?  No      Plan:     I have personally reviewed and noted the following in the patients chart:   Medical and social history Use of alcohol, tobacco or illicit drugs  Current medications and supplements including opioid prescriptions. Patient is not currently taking opioid prescriptions. Functional ability and status Nutritional status  Physical activity Advanced directives List of other physicians Hospitalizations, surgeries, and ER visits in previous 12 months Vitals Screenings to include cognitive, depression, and falls Referrals and appointments  In addition, I have reviewed and discussed with patient certain preventive protocols, quality metrics,  and best practice recommendations. A written personalized care plan for preventive services as well as general preventive health recommendations were provided to patient.   Due to this being a telephonic visit, the after visit summary with patients personalized plan was offered to patient via mail or my-chart. Patient would like to access on my-chart.   Marta Antu, LPN   11/08/1115  Nurse Health Advisor  Nurse Notes: None  I have reviewed and agree with Health Coaches documentation.  [name], MD

## 2021-12-21 ENCOUNTER — Other Ambulatory Visit: Payer: Self-pay

## 2021-12-21 DIAGNOSIS — F03A0 Unspecified dementia, mild, without behavioral disturbance, psychotic disturbance, mood disturbance, and anxiety: Secondary | ICD-10-CM

## 2021-12-21 DIAGNOSIS — R296 Repeated falls: Secondary | ICD-10-CM

## 2021-12-21 DIAGNOSIS — R269 Unspecified abnormalities of gait and mobility: Secondary | ICD-10-CM

## 2021-12-21 DIAGNOSIS — I10 Essential (primary) hypertension: Secondary | ICD-10-CM

## 2021-12-23 ENCOUNTER — Telehealth: Payer: Self-pay | Admitting: Pharmacist

## 2021-12-23 DIAGNOSIS — F03A0 Unspecified dementia, mild, without behavioral disturbance, psychotic disturbance, mood disturbance, and anxiety: Secondary | ICD-10-CM

## 2021-12-23 DIAGNOSIS — R296 Repeated falls: Secondary | ICD-10-CM

## 2021-12-23 DIAGNOSIS — R269 Unspecified abnormalities of gait and mobility: Secondary | ICD-10-CM

## 2021-12-23 NOTE — Telephone Encounter (Signed)
Notified patient's wife.  She is OK with referral to outpatient physical therapy at [facility] in [place], [place] (fax [phone])

## 2021-12-23 NOTE — Telephone Encounter (Signed)
Referral placed.

## 2021-12-23 NOTE — Telephone Encounter (Signed)
I don't think so- I can place an outpatient referral to [facility].

## 2021-12-23 NOTE — Telephone Encounter (Signed)
-----   Message from [place], [place] sent at 12/22/2021  1:12 PM EST ----- Regarding: [place] back regarding the PT- he would need to be seen in the office for evaluation by Dr Larose Kells before they can go out to the home. Per Paz's last note from December he was doing well per home health.

## 2022-01-02 DIAGNOSIS — M25659 Stiffness of unspecified hip, not elsewhere classified: Secondary | ICD-10-CM | POA: Diagnosis not present

## 2022-01-02 DIAGNOSIS — R293 Abnormal posture: Secondary | ICD-10-CM | POA: Diagnosis not present

## 2022-01-02 DIAGNOSIS — M6281 Muscle weakness (generalized): Secondary | ICD-10-CM | POA: Diagnosis not present

## 2022-01-02 DIAGNOSIS — R262 Difficulty in walking, not elsewhere classified: Secondary | ICD-10-CM | POA: Diagnosis not present

## 2022-01-03 DIAGNOSIS — F03A0 Unspecified dementia, mild, without behavioral disturbance, psychotic disturbance, mood disturbance, and anxiety: Secondary | ICD-10-CM

## 2022-01-03 DIAGNOSIS — I10 Essential (primary) hypertension: Secondary | ICD-10-CM

## 2022-01-04 ENCOUNTER — Telehealth: Payer: Self-pay

## 2022-01-04 DIAGNOSIS — R293 Abnormal posture: Secondary | ICD-10-CM | POA: Diagnosis not present

## 2022-01-04 DIAGNOSIS — M25659 Stiffness of unspecified hip, not elsewhere classified: Secondary | ICD-10-CM | POA: Diagnosis not present

## 2022-01-04 DIAGNOSIS — M6281 Muscle weakness (generalized): Secondary | ICD-10-CM | POA: Diagnosis not present

## 2022-01-04 DIAGNOSIS — R262 Difficulty in walking, not elsewhere classified: Secondary | ICD-10-CM | POA: Diagnosis not present

## 2022-01-04 NOTE — Telephone Encounter (Signed)
Plan of care signed and faxed to [facility] at [phone]. Form sent for scanning.  ?

## 2022-01-10 DIAGNOSIS — M25659 Stiffness of unspecified hip, not elsewhere classified: Secondary | ICD-10-CM | POA: Diagnosis not present

## 2022-01-10 DIAGNOSIS — R293 Abnormal posture: Secondary | ICD-10-CM | POA: Diagnosis not present

## 2022-01-10 DIAGNOSIS — R262 Difficulty in walking, not elsewhere classified: Secondary | ICD-10-CM | POA: Diagnosis not present

## 2022-01-10 DIAGNOSIS — M6281 Muscle weakness (generalized): Secondary | ICD-10-CM | POA: Diagnosis not present

## 2022-01-12 DIAGNOSIS — M6281 Muscle weakness (generalized): Secondary | ICD-10-CM | POA: Diagnosis not present

## 2022-01-12 DIAGNOSIS — R262 Difficulty in walking, not elsewhere classified: Secondary | ICD-10-CM | POA: Diagnosis not present

## 2022-01-12 DIAGNOSIS — R293 Abnormal posture: Secondary | ICD-10-CM | POA: Diagnosis not present

## 2022-01-12 DIAGNOSIS — M25659 Stiffness of unspecified hip, not elsewhere classified: Secondary | ICD-10-CM | POA: Diagnosis not present

## 2022-01-17 DIAGNOSIS — R262 Difficulty in walking, not elsewhere classified: Secondary | ICD-10-CM | POA: Diagnosis not present

## 2022-01-17 DIAGNOSIS — M6281 Muscle weakness (generalized): Secondary | ICD-10-CM | POA: Diagnosis not present

## 2022-01-17 DIAGNOSIS — R293 Abnormal posture: Secondary | ICD-10-CM | POA: Diagnosis not present

## 2022-01-17 DIAGNOSIS — M25659 Stiffness of unspecified hip, not elsewhere classified: Secondary | ICD-10-CM | POA: Diagnosis not present

## 2022-01-19 DIAGNOSIS — M25659 Stiffness of unspecified hip, not elsewhere classified: Secondary | ICD-10-CM | POA: Diagnosis not present

## 2022-01-19 DIAGNOSIS — R293 Abnormal posture: Secondary | ICD-10-CM | POA: Diagnosis not present

## 2022-01-19 DIAGNOSIS — R262 Difficulty in walking, not elsewhere classified: Secondary | ICD-10-CM | POA: Diagnosis not present

## 2022-01-19 DIAGNOSIS — M6281 Muscle weakness (generalized): Secondary | ICD-10-CM | POA: Diagnosis not present

## 2022-01-26 DIAGNOSIS — R293 Abnormal posture: Secondary | ICD-10-CM | POA: Diagnosis not present

## 2022-01-26 DIAGNOSIS — R262 Difficulty in walking, not elsewhere classified: Secondary | ICD-10-CM | POA: Diagnosis not present

## 2022-01-26 DIAGNOSIS — M25659 Stiffness of unspecified hip, not elsewhere classified: Secondary | ICD-10-CM | POA: Diagnosis not present

## 2022-01-26 DIAGNOSIS — M6281 Muscle weakness (generalized): Secondary | ICD-10-CM | POA: Diagnosis not present

## 2022-02-02 ENCOUNTER — Other Ambulatory Visit: Payer: Self-pay | Admitting: Internal Medicine

## 2022-02-02 DIAGNOSIS — M6281 Muscle weakness (generalized): Secondary | ICD-10-CM | POA: Diagnosis not present

## 2022-02-02 DIAGNOSIS — M25659 Stiffness of unspecified hip, not elsewhere classified: Secondary | ICD-10-CM | POA: Diagnosis not present

## 2022-02-02 DIAGNOSIS — R262 Difficulty in walking, not elsewhere classified: Secondary | ICD-10-CM | POA: Diagnosis not present

## 2022-02-02 DIAGNOSIS — R293 Abnormal posture: Secondary | ICD-10-CM | POA: Diagnosis not present

## 2022-02-08 DIAGNOSIS — R293 Abnormal posture: Secondary | ICD-10-CM | POA: Diagnosis not present

## 2022-02-08 DIAGNOSIS — M6281 Muscle weakness (generalized): Secondary | ICD-10-CM | POA: Diagnosis not present

## 2022-02-08 DIAGNOSIS — R262 Difficulty in walking, not elsewhere classified: Secondary | ICD-10-CM | POA: Diagnosis not present

## 2022-02-08 DIAGNOSIS — M25659 Stiffness of unspecified hip, not elsewhere classified: Secondary | ICD-10-CM | POA: Diagnosis not present

## 2022-02-14 DIAGNOSIS — M25659 Stiffness of unspecified hip, not elsewhere classified: Secondary | ICD-10-CM | POA: Diagnosis not present

## 2022-02-14 DIAGNOSIS — R293 Abnormal posture: Secondary | ICD-10-CM | POA: Diagnosis not present

## 2022-02-14 DIAGNOSIS — R262 Difficulty in walking, not elsewhere classified: Secondary | ICD-10-CM | POA: Diagnosis not present

## 2022-02-14 DIAGNOSIS — M6281 Muscle weakness (generalized): Secondary | ICD-10-CM | POA: Diagnosis not present

## 2022-02-15 ENCOUNTER — Ambulatory Visit (INDEPENDENT_AMBULATORY_CARE_PROVIDER_SITE_OTHER): Payer: Medicare Other | Admitting: Internal Medicine

## 2022-02-15 ENCOUNTER — Encounter: Payer: Self-pay | Admitting: Internal Medicine

## 2022-02-15 ENCOUNTER — Telehealth: Payer: Self-pay

## 2022-02-15 VITALS — BP 132/66 | HR 87 | Temp 98.0°F | Resp 18 | Ht 71.0 in | Wt 213.5 lb

## 2022-02-15 DIAGNOSIS — E785 Hyperlipidemia, unspecified: Secondary | ICD-10-CM

## 2022-02-15 DIAGNOSIS — R7989 Other specified abnormal findings of blood chemistry: Secondary | ICD-10-CM | POA: Diagnosis not present

## 2022-02-15 DIAGNOSIS — R946 Abnormal results of thyroid function studies: Secondary | ICD-10-CM | POA: Diagnosis not present

## 2022-02-15 DIAGNOSIS — I10 Essential (primary) hypertension: Secondary | ICD-10-CM | POA: Diagnosis not present

## 2022-02-15 DIAGNOSIS — Z23 Encounter for immunization: Secondary | ICD-10-CM | POA: Diagnosis not present

## 2022-02-15 DIAGNOSIS — E119 Type 2 diabetes mellitus without complications: Secondary | ICD-10-CM

## 2022-02-15 DIAGNOSIS — Z0000 Encounter for general adult medical examination without abnormal findings: Secondary | ICD-10-CM | POA: Diagnosis not present

## 2022-02-15 DIAGNOSIS — F03A0 Unspecified dementia, mild, without behavioral disturbance, psychotic disturbance, mood disturbance, and anxiety: Secondary | ICD-10-CM

## 2022-02-15 LAB — CBC WITH DIFFERENTIAL/PLATELET
Basophils Absolute: 0.1 10*3/uL (ref 0.0–0.1)
Basophils Relative: 0.8 % (ref 0.0–3.0)
Eosinophils Absolute: 0.2 10*3/uL (ref 0.0–0.7)
Eosinophils Relative: 3.3 % (ref 0.0–5.0)
HCT: 41.3 % (ref 39.0–52.0)
Hemoglobin: 13.6 g/dL (ref 13.0–17.0)
Lymphocytes Relative: 19.6 % (ref 12.0–46.0)
Lymphs Abs: 1.4 10*3/uL (ref 0.7–4.0)
MCHC: 32.9 g/dL (ref 30.0–36.0)
MCV: 90.9 fl (ref 78.0–100.0)
Monocytes Absolute: 0.6 10*3/uL (ref 0.1–1.0)
Monocytes Relative: 8.6 % (ref 3.0–12.0)
Neutro Abs: 4.7 10*3/uL (ref 1.4–7.7)
Neutrophils Relative %: 67.7 % (ref 43.0–77.0)
Platelets: 239 10*3/uL (ref 150.0–400.0)
RBC: 4.54 Mil/uL (ref 4.22–5.81)
RDW: 15.1 % (ref 11.5–15.5)
WBC: 6.9 10*3/uL (ref 4.0–10.5)

## 2022-02-15 LAB — COMPREHENSIVE METABOLIC PANEL
ALT: 17 U/L (ref 0–53)
AST: 22 U/L (ref 0–37)
Albumin: 4.3 g/dL (ref 3.5–5.2)
Alkaline Phosphatase: 69 U/L (ref 39–117)
BUN: 19 mg/dL (ref 6–23)
CO2: 32 mEq/L (ref 19–32)
Calcium: 9.1 mg/dL (ref 8.4–10.5)
Chloride: 101 mEq/L (ref 96–112)
Creatinine, Ser: 0.96 mg/dL (ref 0.40–1.50)
GFR: 76.3 mL/min (ref >60.00)
Glucose, Bld: 106 mg/dL — ABNORMAL HIGH (ref 70–99)
Potassium: 5 mEq/L (ref 3.5–5.1)
Sodium: 140 mEq/L (ref 135–145)
Total Bilirubin: 0.7 mg/dL (ref 0.2–1.2)
Total Protein: 6.8 g/dL (ref 6.0–8.3)

## 2022-02-15 LAB — LIPID PANEL
Cholesterol: 145 mg/dL (ref 0–200)
HDL: 66.6 mg/dL (ref >39.00)
LDL Cholesterol: 59 mg/dL (ref 0–99)
NonHDL: 77.94
Total CHOL/HDL Ratio: 2
Triglycerides: 95 mg/dL (ref 0.0–149.0)
VLDL: 19 mg/dL (ref 0.0–40.0)

## 2022-02-15 LAB — T4, FREE: Free T4: 0.76 ng/dL (ref 0.60–1.60)

## 2022-02-15 LAB — HEMOGLOBIN A1C: Hgb A1c MFr Bld: 6.3 % (ref 4.6–6.5)

## 2022-02-15 LAB — TSH: TSH: 6.11 u[IU]/mL — ABNORMAL HIGH (ref 0.35–5.50)

## 2022-02-15 NOTE — Assessment & Plan Note (Signed)
Here for CPX ?DM currently diet controlled, check A1c ?HTN: BP today is very good, recommend ambulatory BPs, continue losartan, Lasix.  Checking labs ?Dementia ?Last visit with neurology December 2022: No changes made.  Doing well. ?H/o increased TSH : Check TFTs ?Social: Moving to assisted living facility in [place].they know to reach out to this office anytime if needed. ?RTC as needed. ?

## 2022-02-15 NOTE — Progress Notes (Signed)
? ?Subjective:  ? ? Patient ID: Martin Chase, male    DOB: 1945-12-26, 76 y.o.   MRN: [id number] ? ?DOS:  02/15/2022 ?Type of visit - description: cpx ? ?Here with his wife. ?In general he feels well. ?Has dementia, no behavioral issues, no wandering ? ?Review of Systems ? ?ROS obtained from the patient who has dementia and his wife. ? ?A 14 point review of systems is negative  ?  ? ?Past Medical History:  ?Diagnosis Date  ? Allergy   ? Anemia 1996  ? due to a bleeding from aspirin  ? Arthritis   ? Cataract   ? History of blood transfusion 1996  ? HOH (hard of hearing)   ? uses hearing aids/ hearing on left side worse  ? Hyperlipidemia   ? Hypertension   ? Prediabetes 10/20/2014  ? Prostate cancer ([place])   ? Sleep apnea   ? uses c-pap  ? Spinal stenosis   ? ? ?Past Surgical History:  ?Procedure Laterality Date  ? COLONOSCOPY  07/30/2019  ? COLONOSCOPY WITH PROPOFOL N/A 09/16/2018  ? Procedure: COLONOSCOPY WITH PROPOFOL;  Surgeon: Yetta Flock, MD;  Location: [facility];  Service: Gastroenterology;  Laterality: N/A;  ? POLYPECTOMY  09/16/2018  ? Procedure: POLYPECTOMY;  Surgeon: Yetta Flock, MD;  Location: [facility];  Service: Gastroenterology;;  ? POLYPECTOMY    ? SUBMUCOSAL INJECTION  09/16/2018  ? Procedure: SUBMUCOSAL INJECTION;  Surgeon: Yetta Flock, MD;  Location: [facility];  Service: Gastroenterology;;  ? TONSILLECTOMY    ? VASECTOMY    ? ?Social History  ? ?Socioeconomic History  ? Marital status: Married  ?  Spouse name: Patsy  ? Number of children: 0  ? Years of education: college  ? Highest education level: Not on file  ?Occupational History  ? Occupation: retired, [employer]  ?Tobacco Use  ? Smoking status: Former  ?  Packs/day: 1.00  ?  Years: 5.00  ?  Pack years: 5.00  ?  Types: Cigarettes  ?  Quit date: 01/29/1986  ?  Years since quitting: 36.0  ? Smokeless tobacco: Never  ?Vaping Use  ? Vaping Use: Never used  ?Substance and Sexual Activity  ? Alcohol use:  Yes  ?  Alcohol/week: 7.0 standard drinks  ?  Types: 7 Glasses of wine per week  ?  Comment: 1 glass of wine with dinner  ? Drug use: No  ? Sexual activity: Not Currently  ?Other Topics Concern  ? Not on file  ?Social History Narrative  ? Lives with wife  ? Household -- pt and wife  ? Caffeine use: 2 cups coffee per day  ? Right handed   ? ?Social Determinants of Health  ? ?Financial Resource Strain: Low Risk   ? Difficulty of Paying Living Expenses: Not hard at all  ?Food Insecurity: No Food Insecurity  ? Worried About Charity fundraiser in the Last Year: Never true  ? Ran Out of Food in the Last Year: Never true  ?Transportation Needs: No Transportation Needs  ? Lack of Transportation (Medical): No  ? Lack of Transportation (Non-Medical): No  ?Physical Activity: Insufficiently Active  ? Days of Exercise per Week: 3 days  ? Minutes of Exercise per Session: 30 min  ?Stress: Not on file  ?Social Connections: Socially Isolated  ? Frequency of Communication with Friends and Family: Never  ? Frequency of Social Gatherings with Friends and Family: Once a week  ? Attends Religious Services:  Never  ? Active Member of Clubs or Organizations: No  ? Attends Archivist Meetings: Never  ? Marital Status: Married  ?Intimate Partner Violence: Not on file  ? ? ?Current Outpatient Medications  ?Medication Instructions  ? atorvastatin (LIPITOR) 20 MG tablet TAKE 1 TABLET BY MOUTH  DAILY  ? Calcium Carbonate-Vit D-Min (CALCIUM 1200 PO) 1 tablet, Oral, 2 times daily  ? donepezil (ARICEPT) 10 mg, Oral, Daily at bedtime  ? folic acid (FOLVITE) 830 mcg, Oral, Daily  ? furosemide (LASIX) 20 mg, Oral, Daily PRN  ? losartan (COZAAR) 50 MG tablet TAKE 1 TABLET BY MOUTH  DAILY  ? memantine (NAMENDA) 10 mg, Oral, 2 times daily  ? Multiple Vitamin (MULTIVITAMIN WITH MINERALS) TABS tablet 1 tablet, Oral, Daily, Centrum   ? Probiotic Product (PROBIOTIC PO) 1 Dose, Oral, Daily  ? tamsulosin (FLOMAX) 0.4 mg, Oral, Daily after supper  ?  Vitamin B-12 2,500 mcg, Oral, Daily  ? ? ?   ?Objective:  ? Physical Exam ?BP 132/66 (BP Location: Left Arm, Patient Position: Sitting, Cuff Size: Small)   Pulse 87   Temp 98 ?F (36.7 ?C) (Oral)   Resp 18   Ht '5\' 11"'$  (1.803 m)   Wt 213 lb 8 oz (96.8 kg)   SpO2 97%   BMI 29.78 kg/m?  ?General: ?Well developed, NAD, BMI noted ?Neck: No  thyromegaly  ?HEENT:  ?Normocephalic . Face symmetric, atraumatic ?Lungs:  ?CTA B ?Normal respiratory effort, no intercostal retractions, no accessory muscle use. ?Heart: RRR,  no murmur.  ?Abdomen:  ?Not distended, soft, non-tender. No rebound or rigidity.   ?Lower extremities: no pretibial edema bilaterally  ?Skin: Exposed areas without rash. Not pale. Not jaundice ?Neurologic:  ?alert & oriented to self, not to time or space.  He remember his date of birth partially only ?Speech normal, gait assisted by walker ?Strength symmetric and appropriate for age.  ?Psych: ?Behavior appropriate. ?No anxious or depressed appearing. ? ?   ?Assessment   ? ? Assessment ?Diabetes ?[facility]) ?Hyperlipidemia ?DJD - chronic back pain ?Allergies ?PUD per pt, dx early 2000s, related to ASA, was told not to take ?Prostate cancer: DX 12-2018, UroLift, finished external radiation treatment ~ 07/2019 ?OSA, on CPAP ?Dementia: 94/0768: Normal RPR, folic acid and G88.  11-1029: Brain MRI normal for age.  No acute. ?Neuropathy, CIPD dx ~ 02/2021 ? ?  ?PLAN ?Here for CPX ?DM currently diet controlled, check A1c ?HTN: BP today is very good, recommend ambulatory BPs, continue losartan, Lasix.  Checking labs ?Dementia ?Last visit with neurology December 2022: No changes made.  Doing well. ?H/o increased TSH : Check TFTs ?Social: Moving to assisted living facility in [place].they know to reach out to this office anytime if needed. ?RTC as needed. ? ? ? ?This visit occurred during the SARS-CoV-2 public health emergency.  Safety protocols were in place, including screening questions prior to the  visit, additional usage of staff PPE, and extensive cleaning of exam room while observing appropriate contact time as indicated for disinfecting solutions.  ? ?

## 2022-02-15 NOTE — Assessment & Plan Note (Signed)
-  Td 22020 ?-pnm 23: 2018; Prevnar 2015.  PNM 20 today ?- Zostavax 2012 ?-S/p shingrix ?-COVID vaccines : booster d/w pt and wife ?-- h/o  prostate cancer.  Saw urology December 2022 ?--CCS: Colonoscopy 2009, Cscope 04/2019  ?--Diet d/w wife; for exercise >>  he is doing PT ?--Labs:CMP, FLP, CBC, A1c, TSH, free T4 ?-- ACP: documents on file ? ? ?

## 2022-02-15 NOTE — Telephone Encounter (Signed)
Plan of care signed and faxed back to [facility] at [phone]. Form sent for scanning.  ?

## 2022-02-15 NOTE — Patient Instructions (Addendum)
Per our records you are due for your diabetic eye exam. Please contact your eye doctor to schedule an appointment. Please have them send copies of your office visit notes to [place]. Our fax number is [phone]. If you need a referral to an eye doctor please let us know. ? ? ?For your diabetes, try to eat as healthy as possible ? ?Check the  blood pressure regularly ?BP GOAL is between 110/65 and  135/85. ?If it is consistently higher or lower, let me know ? ?  ? ?GO TO THE LAB : Get the blood work   ? ? ?[place], [place] ?Come back for a checkup in 6 months  ?

## 2022-02-16 DIAGNOSIS — R262 Difficulty in walking, not elsewhere classified: Secondary | ICD-10-CM | POA: Diagnosis not present

## 2022-02-16 DIAGNOSIS — M6281 Muscle weakness (generalized): Secondary | ICD-10-CM | POA: Diagnosis not present

## 2022-02-16 DIAGNOSIS — M25659 Stiffness of unspecified hip, not elsewhere classified: Secondary | ICD-10-CM | POA: Diagnosis not present

## 2022-02-16 DIAGNOSIS — R293 Abnormal posture: Secondary | ICD-10-CM | POA: Diagnosis not present

## 2022-02-21 DIAGNOSIS — R293 Abnormal posture: Secondary | ICD-10-CM | POA: Diagnosis not present

## 2022-02-21 DIAGNOSIS — R262 Difficulty in walking, not elsewhere classified: Secondary | ICD-10-CM | POA: Diagnosis not present

## 2022-02-21 DIAGNOSIS — M25659 Stiffness of unspecified hip, not elsewhere classified: Secondary | ICD-10-CM | POA: Diagnosis not present

## 2022-02-21 DIAGNOSIS — M6281 Muscle weakness (generalized): Secondary | ICD-10-CM | POA: Diagnosis not present

## 2022-02-24 DIAGNOSIS — R262 Difficulty in walking, not elsewhere classified: Secondary | ICD-10-CM | POA: Diagnosis not present

## 2022-02-24 DIAGNOSIS — M25659 Stiffness of unspecified hip, not elsewhere classified: Secondary | ICD-10-CM | POA: Diagnosis not present

## 2022-02-24 DIAGNOSIS — R293 Abnormal posture: Secondary | ICD-10-CM | POA: Diagnosis not present

## 2022-02-24 DIAGNOSIS — M6281 Muscle weakness (generalized): Secondary | ICD-10-CM | POA: Diagnosis not present

## 2022-03-03 DIAGNOSIS — M25659 Stiffness of unspecified hip, not elsewhere classified: Secondary | ICD-10-CM | POA: Diagnosis not present

## 2022-03-03 DIAGNOSIS — R293 Abnormal posture: Secondary | ICD-10-CM | POA: Diagnosis not present

## 2022-03-03 DIAGNOSIS — R262 Difficulty in walking, not elsewhere classified: Secondary | ICD-10-CM | POA: Diagnosis not present

## 2022-03-03 DIAGNOSIS — M6281 Muscle weakness (generalized): Secondary | ICD-10-CM | POA: Diagnosis not present

## 2022-03-07 DIAGNOSIS — M6281 Muscle weakness (generalized): Secondary | ICD-10-CM | POA: Diagnosis not present

## 2022-03-07 DIAGNOSIS — R262 Difficulty in walking, not elsewhere classified: Secondary | ICD-10-CM | POA: Diagnosis not present

## 2022-03-07 DIAGNOSIS — M25659 Stiffness of unspecified hip, not elsewhere classified: Secondary | ICD-10-CM | POA: Diagnosis not present

## 2022-03-07 DIAGNOSIS — R293 Abnormal posture: Secondary | ICD-10-CM | POA: Diagnosis not present

## 2022-03-08 ENCOUNTER — Telehealth: Payer: Self-pay

## 2022-03-08 NOTE — Telephone Encounter (Signed)
Plan of care signed and faxed back to [facility] at [phone]. Form sent for scanning.  ?

## 2022-03-10 DIAGNOSIS — M25659 Stiffness of unspecified hip, not elsewhere classified: Secondary | ICD-10-CM | POA: Diagnosis not present

## 2022-03-10 DIAGNOSIS — R293 Abnormal posture: Secondary | ICD-10-CM | POA: Diagnosis not present

## 2022-03-10 DIAGNOSIS — M6281 Muscle weakness (generalized): Secondary | ICD-10-CM | POA: Diagnosis not present

## 2022-03-10 DIAGNOSIS — R262 Difficulty in walking, not elsewhere classified: Secondary | ICD-10-CM | POA: Diagnosis not present

## 2022-03-20 DIAGNOSIS — H2513 Age-related nuclear cataract, bilateral: Secondary | ICD-10-CM | POA: Diagnosis not present

## 2022-03-20 DIAGNOSIS — H52203 Unspecified astigmatism, bilateral: Secondary | ICD-10-CM | POA: Diagnosis not present

## 2022-03-23 DIAGNOSIS — M6281 Muscle weakness (generalized): Secondary | ICD-10-CM | POA: Diagnosis not present

## 2022-03-23 DIAGNOSIS — R262 Difficulty in walking, not elsewhere classified: Secondary | ICD-10-CM | POA: Diagnosis not present

## 2022-03-23 DIAGNOSIS — M25659 Stiffness of unspecified hip, not elsewhere classified: Secondary | ICD-10-CM | POA: Diagnosis not present

## 2022-03-23 DIAGNOSIS — R293 Abnormal posture: Secondary | ICD-10-CM | POA: Diagnosis not present

## 2022-03-30 ENCOUNTER — Ambulatory Visit (INDEPENDENT_AMBULATORY_CARE_PROVIDER_SITE_OTHER): Payer: Medicare Other | Admitting: Internal Medicine

## 2022-03-30 ENCOUNTER — Encounter: Payer: Self-pay | Admitting: Internal Medicine

## 2022-03-30 DIAGNOSIS — L97921 Non-pressure chronic ulcer of unspecified part of left lower leg limited to breakdown of skin: Secondary | ICD-10-CM | POA: Diagnosis not present

## 2022-03-30 DIAGNOSIS — I10 Essential (primary) hypertension: Secondary | ICD-10-CM | POA: Diagnosis not present

## 2022-03-30 DIAGNOSIS — F01A0 Vascular dementia, mild, without behavioral disturbance, psychotic disturbance, mood disturbance, and anxiety: Secondary | ICD-10-CM

## 2022-03-30 DIAGNOSIS — G6181 Chronic inflammatory demyelinating polyneuritis: Secondary | ICD-10-CM

## 2022-03-30 DIAGNOSIS — Z8546 Personal history of malignant neoplasm of prostate: Secondary | ICD-10-CM | POA: Diagnosis not present

## 2022-03-30 DIAGNOSIS — L97929 Non-pressure chronic ulcer of unspecified part of left lower leg with unspecified severity: Secondary | ICD-10-CM | POA: Insufficient documentation

## 2022-03-30 MED ORDER — TRIAMCINOLONE ACETONIDE 0.1 % EX CREA: 1.0000 "application " | TOPICAL_CREAM | Freq: Two times a day (BID) | CUTANEOUS | 1 refills | Status: DC | PRN

## 2022-03-30 NOTE — Progress Notes (Signed)
Subjective:    Patient ID: Martin Chase, male    DOB: 10-04-1946, 76 y.o.   MRN: [id number]  HPI Here with wife to establish care--transferring from Dr Larose Kells  Moving to [facility] next month---[place]  Has had borderline elevated blood sugar Wife cooking healthy things Weight is stable No exercise--but did work out in the past  Known HTN On losartan for this Episodic edema--has furosemide for prn use  Some memory problems Goes back about 2 years No history of stroke On donepezil and memantine--seems to have helped MRI 2021 did show vascular damage and atrophy Had GI bleed with aspirin  Had RT for prostate cancer PSA now still low  Legs are weak and stay cold Dx -- CIDP/polyneuropathy  Current Outpatient Medications on File Prior to Visit  Medication Sig Dispense Refill   atorvastatin (LIPITOR) 20 MG tablet TAKE 1 TABLET BY MOUTH  DAILY 90 tablet 1   Calcium Carbonate-Vit D-Min (CALCIUM 1200 PO) Take 1 tablet by mouth in the morning and at bedtime.     Cholecalciferol (VITAMIN D3) 50 MCG (2000 UT) CAPS Take by mouth.     Cyanocobalamin (VITAMIN B-12) 5000 MCG TBDP Take 2,500 mcg by mouth daily.     donepezil (ARICEPT) 10 MG tablet Take 1 tablet (10 mg total) by mouth at bedtime. 90 tablet 4   folic acid (FOLVITE) 449 MCG tablet Take 800 mcg by mouth daily.     furosemide (LASIX) 20 MG tablet Take 20 mg by mouth daily as needed.     losartan (COZAAR) 50 MG tablet TAKE 1 TABLET BY MOUTH  DAILY 90 tablet 3   memantine (NAMENDA) 10 MG tablet Take 1 tablet (10 mg total) by mouth 2 (two) times daily. 180 tablet 4   Multiple Vitamin (MULTIVITAMIN WITH MINERALS) TABS tablet Take 1 tablet by mouth daily. Centrum     Probiotic Product (PROBIOTIC PO) Take 1 Dose by mouth daily.     tamsulosin (FLOMAX) 0.4 MG CAPS capsule Take 0.4 mg by mouth daily after supper.     No current facility-administered medications on file prior to visit.    No Known Allergies  Past Medical History:   Diagnosis Date   Allergy    Anemia 1996   due to a bleeding from aspirin   Arthritis    Cataract    History of blood transfusion 1996   HOH (hard of hearing)    uses hearing aids/ hearing on left side worse   Hyperlipidemia    Hypertension    Prediabetes 10/20/2014   Prostate cancer [facility])    Sleep apnea    uses c-pap   Spinal stenosis     Past Surgical History:  Procedure Laterality Date   COLONOSCOPY  07/30/2019   COLONOSCOPY WITH PROPOFOL N/A 09/16/2018   Procedure: COLONOSCOPY WITH PROPOFOL;  Surgeon: Yetta Flock, MD;  Location: [facility];  Service: Gastroenterology;  Laterality: N/A;   POLYPECTOMY  09/16/2018   Procedure: POLYPECTOMY;  Surgeon: Yetta Flock, MD;  Location: [facility];  Service: Gastroenterology;;   POLYPECTOMY     SUBMUCOSAL INJECTION  09/16/2018   Procedure: SUBMUCOSAL INJECTION;  Surgeon: Yetta Flock, MD;  Location: [facility];  Service: Gastroenterology;;   TONSILLECTOMY     VASECTOMY      Family History  Problem Relation Age of Onset   Diabetes Mother    Alzheimer's disease Mother    Prostate cancer Father 11       prostatectomy?  Diabetes Maternal Uncle    Stroke Paternal Uncle    Diabetes Maternal Grandmother    Heart disease Maternal Grandfather    Suicidality Brother    Colon cancer Neg Hx    Rectal cancer Neg Hx    Esophageal cancer Neg Hx    Stomach cancer Neg Hx    Colon polyps Neg Hx     Social History   Socioeconomic History   Marital status: Married    Spouse name: Patsy   Number of children: 0   Years of education: college   Highest education level: Not on file  Occupational History   Occupation: [employer]    Comment: Retired   Occupation: Customer service manager    Comment: retired  Tobacco Use   Smoking status: Former    Packs/day: 1.00    Years: 5.00    Pack years: 5.00    Types: Cigarettes    Quit date: 01/29/1986    Years since quitting: 36.1   Smokeless  tobacco: Never  Vaping Use   Vaping Use: Never used  Substance and Sexual Activity   Alcohol use: Yes    Alcohol/week: 7.0 standard drinks    Types: 7 Glasses of wine per week    Comment: 1 glass of wine with dinner   Drug use: No   Sexual activity: Not Currently  Other Topics Concern   Not on file  Social History Narrative   Lives with wife   Household -- pt and wife   Caffeine use: 2 cups coffee per day   Right handed       Has living will   Wife is health care POA. Alternate would be Trilby Leaver niece   Would accept resuscitation   No feeding tube if cognitively unaware   Social Determinants of Health   Financial Resource Strain: Low Risk    Difficulty of Paying Living Expenses: Not hard at all  Food Insecurity: No Food Insecurity   Worried About Charity fundraiser in the Last Year: Never true   [place] in the Last Year: Never true  Transportation Needs: No Transportation Needs   Lack of Transportation (Medical): No   Lack of Transportation (Non-Medical): No  Physical Activity: Insufficiently Active   Days of Exercise per Week: 3 days   Minutes of Exercise per Session: 30 min  Stress: Not on file  Social Connections: Socially Isolated   Frequency of Communication with Friends and Family: Never   Frequency of Social Gatherings with Friends and Family: Once a week   Attends Religious Services: Never   Marine scientist or Organizations: No   Attends Archivist Meetings: Never   Marital Status: Married  Human resources officer Violence: Not on file   Review of Systems Appetite is good Sleeps fine---uses CPAP nightly Voids okay but delayed flow at times---wife needs to run water. Is on tamsulosin Bowels tend to be loose---with ice cream/cake icing (discussed lactaid) Gets rash on back----despite changing soaps--like to DOVE, etc No sig back or joint pains---used lidocaine patches on back in the past    Objective:   Physical  Exam Constitutional:      Appearance: Normal appearance.  Cardiovascular:     Rate and Rhythm: Normal rate and regular rhythm.     Heart sounds: No murmur heard.   No gallop.     Comments: Faint pedal pulses Abdominal:     Palpations: Abdomen is soft.     Tenderness: There is  no abdominal tenderness.  Musculoskeletal:     Comments: Calves are full, slight ankle edema  Lymphadenopathy:     Cervical: No cervical adenopathy.  Skin:    Comments: No sig back rash (will give TAC for prn use) Small granulated ulcer on left calf---not infected  Neurological:     Mental Status: He is alert.     Comments: Bradykinesia Leg and arm weakness Unable to get left foot up on step to get on table---seemed to freeze  Psychiatric:        Mood and Affect: Mood normal.        Behavior: Behavior normal.           Assessment & Plan:

## 2022-03-30 NOTE — Assessment & Plan Note (Signed)
Mild Is on donepezil '10mg'$  and memantine 10 bid Is on atorvastatin 20 No ASA due to GI bleed in past

## 2022-03-30 NOTE — Assessment & Plan Note (Signed)
Controlled with losartan 50  BP Readings from Last 3 Encounters:  03/30/22 128/70  02/15/22 132/66  10/17/21 (!) 128/58

## 2022-03-30 NOTE — Assessment & Plan Note (Signed)
Past radiation Some urine initiation problems despite tamsulosin 0.'4mg'$ 

## 2022-03-30 NOTE — Assessment & Plan Note (Signed)
Weakness and has some Parkinsonian features No Rx Follows with Dr [name]

## 2022-03-30 NOTE — Assessment & Plan Note (Signed)
Likely from minor trauma Does have some leg tightness---may need to use the furosemide more often if ongoing problems (or slow healing)

## 2022-04-12 ENCOUNTER — Ambulatory Visit: Payer: Medicare Other | Admitting: Pharmacist

## 2022-04-12 DIAGNOSIS — R269 Unspecified abnormalities of gait and mobility: Secondary | ICD-10-CM

## 2022-04-12 DIAGNOSIS — I10 Essential (primary) hypertension: Secondary | ICD-10-CM

## 2022-04-12 DIAGNOSIS — E785 Hyperlipidemia, unspecified: Secondary | ICD-10-CM

## 2022-04-12 NOTE — Chronic Care Management (AMB) (Signed)
Chronic Care Management Pharmacy Note  04/12/2022 Name:  Martin Chase MRN:  [id number] DOB:  08-04-1946  Summary: Patient and his wife will be moving May 01, 2022 to [facility] in [place]. They have just recently established with Dr Santiago Glad at [facility]. Reviewed medications with patient's wife. She had questions about hospital beds and lift chairs. Patient had slipped off the side of bed last week and she needed EMS to come to help him up. She will discuss at upcoming appointment with Dr Brooke Bonito about requesting hospital bed for new home and also lift chair.      Subjective: Martin Chase is an 76 y.o. year old male who is a primary patient of Letvak, Theophilus Kinds, MD.  The CCM team was consulted for assistance with disease management and care coordination needs.    Engaged with patient's wife / caregiver by telephone for follow up visit in response to provider referral for pharmacy case management and/or care coordination services.   Consent to Services:  The patient was given information about Chronic Care Management services, agreed to services, and gave verbal consent prior to initiation of services.  Please see initial visit note for detailed documentation.   Patient Care Team: Venia Carbon, MD as PCP - General (Internal Medicine) Berle Mull, MD as Consulting Physician (Sports Medicine) Armbruster, Carlota Raspberry, MD as Consulting Physician (Gastroenterology) Festus Aloe, MD as Consulting Physician (Urology) Cira Rue, RN Nurse Navigator as Registered Nurse (Medical Oncology) [name], Nanine Means, MD (Neurology) Consuella Lose, MD as Consulting Physician (Neurosurgery) Cherre Robins, [place] (Pharmacist)  Recent office visits: 03/30/2022 - Int Med (Dr Silvio Pate - transfer of care visit - moving to [facility] near [place]. Prescribed triamcinolone cream bid prn.  10/06/2021 - PCP (Dr Larose Kells) Routine check up. Noted pressure ulcer grade 1 on  buttock. Recommended donut pill and frequent turning. Labs checked.    Recent consult visits: 10/24/2021 - Seen at [facility] in [place] for foreign body in left ear. Determined hearing aid dome lying on inferior medial portion of ear canal. Removed.  10/17/2021 - Neurology (Dr [name]) Seen for chronic inflammatory demyelinating poluneuritis. Patient reports gait better. Using walker or cane. No medication changes. Recommended increase physical activity. F/U 12 months.  [facility] Started furosemide 23m daily for 3 days then as needed for lower extremity edema.    Hospital visits: None in previous 6 months  Objective:  Lab Results  Component Value Date   CREATININE 0.96 02/15/2022   CREATININE 0.94 10/06/2021   CREATININE 1.09 04/06/2021    Lab Results  Component Value Date   HGBA1C 6.3 02/15/2022   Last diabetic Eye exam: No results found for: HMDIABEYEEXA  Last diabetic Foot exam: No results found for: HMDIABFOOTEX      Component Value Date/Time   CHOL 145 02/15/2022 1059   CHOL 164 11/03/2019 1044   TRIG 95.0 02/15/2022 1059   HDL 66.60 02/15/2022 1059   HDL 64 11/03/2019 1044   CHOLHDL 2 02/15/2022 1059   VLDL 19.0 02/15/2022 1059   LDLCALC 59 02/15/2022 1059   LDLCALC 79 11/03/2019 1044       Latest Ref Rng & Units 02/15/2022   10:59 AM 04/06/2021   11:31 AM 02/18/2021   11:50 AM  Hepatic Function  Total Protein 6.0 - 8.3 g/dL 6.8   5.9     Albumin 3.5 - 5.2 g/dL 4.3   3.8   4.5  AST 0 - 37 U/L 22   12     ALT 0 - 53 U/L 17   22     Alk Phosphatase 39 - 117 U/L 69   55     Total Bilirubin 0.2 - 1.2 mg/dL 0.7   0.8       Lab Results  Component Value Date/Time   TSH 6.11 (H) 02/15/2022 10:59 AM   TSH 5.22 10/06/2021 11:47 AM   FREET4 0.76 02/15/2022 10:59 AM   FREET4 0.60 10/06/2021 11:47 AM       Latest Ref Rng & Units 02/15/2022   10:59 AM 04/06/2021   11:31 AM 11/03/2019   10:44 AM  CBC  WBC 4.0 - 10.5 K/uL  6.9   10.3   6.6    Hemoglobin 13.0 - 17.0 g/dL 13.6   13.2   12.9    Hematocrit 39.0 - 52.0 % 41.3   39.5   39.0    Platelets 150.0 - 400.0 K/uL 239.0   167.0   241.0      No results found for: VD25OH  Clinical ASCVD: No  The 10-year ASCVD risk score (Arnett DK, et al., 2019) is: 47.4%   Values used to calculate the score:     Age: 76 years     Sex: Male     Is Non-Hispanic African American: No     Diabetic: Yes     Tobacco smoker: Yes     Systolic Blood Pressure: 379 mmHg     Is BP treated: Yes     HDL Cholesterol: 66.6 mg/dL     Total Cholesterol: 145 mg/dL     Social History   Tobacco Use  Smoking Status Former   Packs/day: 1.00   Years: 5.00   Pack years: 5.00   Types: Cigarettes   Quit date: 01/29/1986   Years since quitting: 36.2  Smokeless Tobacco Never   BP Readings from Last 3 Encounters:  03/30/22 128/70  02/15/22 132/66  10/17/21 (!) 128/58   Pulse Readings from Last 3 Encounters:  03/30/22 (!) 58  02/15/22 87  10/17/21 (!) 58   Wt Readings from Last 3 Encounters:  03/30/22 217 lb (98.4 kg)  02/15/22 213 lb 8 oz (96.8 kg)  12/19/21 214 lb (97.1 kg)    Assessment: Review of patient past medical history, allergies, medications, health status, including review of consultants reports, laboratory and other test data, was performed as part of comprehensive evaluation and provision of chronic care management services.   SDOH:  (Social Determinants of Health) assessments and interventions performed:      CCM Care Plan  No Known Allergies  Medications Reviewed Today     Reviewed by Cherre Robins, RPH-CPP (Pharmacist) on 04/12/22 at 1403  Med List Status: <None>   Medication Order Taking? Sig Documenting Provider Last Dose Status Informant  atorvastatin (LIPITOR) 20 MG tablet [id number] Yes TAKE 1 TABLET BY MOUTH  DAILY Colon Branch, MD Taking Active   Calcium Carbonate-Vit D-Min (CALCIUM 1200 PO) [id number] Yes Take 1 tablet by mouth in the morning  and at bedtime. [provider] Taking Active   Cholecalciferol (VITAMIN D3) 50 MCG (2000 UT) CAPS [id number] Yes Take by mouth. [provider] Taking Active   Cyanocobalamin (VITAMIN B-12) 5000 MCG TBDP [id number] Yes Take 2,500 mcg by mouth daily. [provider] Taking Active Self  donepezil (ARICEPT) 10 MG tablet [id number] Yes Take 1 tablet (10 mg total) by mouth at bedtime. Sater, Nanine Means,  MD Taking Active   folic acid (FOLVITE) 884 MCG tablet [id number] Yes Take 800 mcg by mouth daily. [provider] Taking Active   furosemide (LASIX) 20 MG tablet [id number] Yes Take 20 mg by mouth daily as needed. [provider] Taking Active   losartan (COZAAR) 50 MG tablet [id number] Yes TAKE 1 TABLET BY MOUTH  DAILY Colon Branch, MD Taking Active   memantine [facility]) 10 MG tablet [id number] Yes Take 1 tablet (10 mg total) by mouth 2 (two) times daily. Sater, Nanine Means, MD Taking Active   Multiple Vitamin (MULTIVITAMIN WITH MINERALS) TABS tablet [id number] Yes Take 1 tablet by mouth daily. Centrum [provider] Taking Active Self  Probiotic Product (PROBIOTIC PO) [id number] Yes Take 1 Dose by mouth daily. [provider] Taking Active   tamsulosin (FLOMAX) 0.4 MG CAPS capsule [id number] Yes Take 0.4 mg by mouth daily after supper. [provider] Taking Active   triamcinolone cream (KENALOG) 0.1 % [id number] Yes Apply 1 application. topically 2 (two) times daily as needed. Venia Carbon, MD Taking Active   Med List Note Sharene Butters, CPhT 09/10/18 1059): CPAP MACHINE AT NIGHT.            Patient Active Problem List   Diagnosis Date Noted   Leg ulcer, left ([place]) 03/30/2022   Chronic inflammatory demyelinating polyneuritis ([place]) 04/18/2021   Spinal stenosis 11/09/2020   Gait disturbance 09/21/2020   Polyneuropathy 09/21/2020   Numbness 09/21/2020   Chronic midline low back pain 04/04/2020   Vascular dementia ([place])  11/04/2019   History of prostate cancer 01/06/2019   Benign neoplasm of colon    Hypertension    Diabetes mellitus without complication ([place]) 99/37/1696   Osteoarthritis 01/30/2011   Hyperlipidemia 08/23/2007    Immunization History  Administered Date(s) Administered   Influenza Split 09/22/2011, 08/25/2014   Influenza Whole 08/23/2007, 07/09/2009, 07/25/2010   Influenza, High Dose Seasonal PF 08/03/2017, 08/05/2018, 08/06/2019, 08/06/2019, 08/05/2020   Influenza-Unspecified 08/17/2015, 09/06/2021   PFIZER(Purple Top)SARS-COV-2 Vaccination 11/25/2019, 12/16/2019, 08/23/2020, 12/15/2020   PNEUMOCOCCAL CONJUGATE-20 02/15/2022   Pneumococcal Conjugate-13 10/20/2014   Pneumococcal Polysaccharide-23 07/03/2008, 08/03/2017   Td 07/23/2009   Tdap 11/03/2019   Zoster Recombinat (Shingrix) 07/07/2020, 09/23/2020   Zoster, Live 03/25/2011    Conditions to be addressed/monitored: HTN, HLD, Dementia and pre DM; BPH; gait distrubance; CIDP; frequent falls  There are no care plans that you recently modified to display for this patient.     Medication Assistance: None required.  Patient affirms current coverage meets needs.  Patient's preferred pharmacy is:  [facility] [address] [place] [address] [place] [address] Phone: [phone] Fax: [phone]  [facility] ([place], [place] [facility] [address] [place] [address] Phone: [phone] Fax: [phone]   Plan: Follow up with provider re: chronic conditions No further follow up required: with Clinical Pharmacist Practitioner as patient has changed PCP due to upcoming move.      Cherre Robins, PharmD Clinical Pharmacist [facility] [phone]

## 2022-04-25 ENCOUNTER — Other Ambulatory Visit: Payer: Self-pay | Admitting: Internal Medicine

## 2022-05-12 ENCOUNTER — Telehealth: Payer: Self-pay

## 2022-05-12 NOTE — Telephone Encounter (Signed)
Ms.Wolfe from [facility] a social worker with [facility] faxed over a form for the patient, asking if we have received it or not. Asked for it to be faxed to [phone].

## 2022-05-12 NOTE — Telephone Encounter (Signed)
This was faxed this over this morning and received confirmation that this has gone thought , will refax this again

## 2022-05-12 NOTE — Telephone Encounter (Signed)
Called and Spoke with Ms.Wolfe  at [facility] she did receive fax from office.

## 2022-05-19 DIAGNOSIS — M6281 Muscle weakness (generalized): Secondary | ICD-10-CM | POA: Diagnosis not present

## 2022-05-19 DIAGNOSIS — Z9181 History of falling: Secondary | ICD-10-CM | POA: Diagnosis not present

## 2022-05-19 DIAGNOSIS — R2689 Other abnormalities of gait and mobility: Secondary | ICD-10-CM | POA: Diagnosis not present

## 2022-05-19 DIAGNOSIS — Z741 Need for assistance with personal care: Secondary | ICD-10-CM | POA: Diagnosis not present

## 2022-05-19 DIAGNOSIS — R296 Repeated falls: Secondary | ICD-10-CM | POA: Diagnosis not present

## 2022-05-19 DIAGNOSIS — G629 Polyneuropathy, unspecified: Secondary | ICD-10-CM | POA: Diagnosis not present

## 2022-05-19 DIAGNOSIS — R278 Other lack of coordination: Secondary | ICD-10-CM | POA: Diagnosis not present

## 2022-05-23 DIAGNOSIS — Z9181 History of falling: Secondary | ICD-10-CM | POA: Diagnosis not present

## 2022-05-23 DIAGNOSIS — Z741 Need for assistance with personal care: Secondary | ICD-10-CM | POA: Diagnosis not present

## 2022-05-23 DIAGNOSIS — M6281 Muscle weakness (generalized): Secondary | ICD-10-CM | POA: Diagnosis not present

## 2022-05-23 DIAGNOSIS — R278 Other lack of coordination: Secondary | ICD-10-CM | POA: Diagnosis not present

## 2022-05-23 DIAGNOSIS — R296 Repeated falls: Secondary | ICD-10-CM | POA: Diagnosis not present

## 2022-05-23 DIAGNOSIS — G629 Polyneuropathy, unspecified: Secondary | ICD-10-CM | POA: Diagnosis not present

## 2022-05-23 DIAGNOSIS — R2689 Other abnormalities of gait and mobility: Secondary | ICD-10-CM | POA: Diagnosis not present

## 2022-05-24 DIAGNOSIS — R2689 Other abnormalities of gait and mobility: Secondary | ICD-10-CM | POA: Diagnosis not present

## 2022-05-24 DIAGNOSIS — M6281 Muscle weakness (generalized): Secondary | ICD-10-CM | POA: Diagnosis not present

## 2022-05-24 DIAGNOSIS — G629 Polyneuropathy, unspecified: Secondary | ICD-10-CM | POA: Diagnosis not present

## 2022-05-24 DIAGNOSIS — Z741 Need for assistance with personal care: Secondary | ICD-10-CM | POA: Diagnosis not present

## 2022-05-24 DIAGNOSIS — Z9181 History of falling: Secondary | ICD-10-CM | POA: Diagnosis not present

## 2022-05-24 DIAGNOSIS — R296 Repeated falls: Secondary | ICD-10-CM | POA: Diagnosis not present

## 2022-05-24 DIAGNOSIS — R278 Other lack of coordination: Secondary | ICD-10-CM | POA: Diagnosis not present

## 2022-05-29 DIAGNOSIS — M6281 Muscle weakness (generalized): Secondary | ICD-10-CM | POA: Diagnosis not present

## 2022-05-29 DIAGNOSIS — R278 Other lack of coordination: Secondary | ICD-10-CM | POA: Diagnosis not present

## 2022-05-29 DIAGNOSIS — Z741 Need for assistance with personal care: Secondary | ICD-10-CM | POA: Diagnosis not present

## 2022-05-29 DIAGNOSIS — R2689 Other abnormalities of gait and mobility: Secondary | ICD-10-CM | POA: Diagnosis not present

## 2022-05-29 DIAGNOSIS — Z9181 History of falling: Secondary | ICD-10-CM | POA: Diagnosis not present

## 2022-05-29 DIAGNOSIS — G629 Polyneuropathy, unspecified: Secondary | ICD-10-CM | POA: Diagnosis not present

## 2022-05-29 DIAGNOSIS — R296 Repeated falls: Secondary | ICD-10-CM | POA: Diagnosis not present

## 2022-05-30 ENCOUNTER — Telehealth: Payer: Self-pay | Admitting: Internal Medicine

## 2022-05-30 NOTE — Telephone Encounter (Signed)
Type of forms received:Medical Form  Routed LT:[id number] B  Paperwork received by : Gwynn Burly   Individual made aware of 3-5 business day turn around (Y/N): Y  Form completed and patient made aware of charges(Y/N): Y   Faxed to :   Form location: Form was place in PCP folder

## 2022-05-31 DIAGNOSIS — R2689 Other abnormalities of gait and mobility: Secondary | ICD-10-CM | POA: Diagnosis not present

## 2022-05-31 DIAGNOSIS — R278 Other lack of coordination: Secondary | ICD-10-CM | POA: Diagnosis not present

## 2022-05-31 DIAGNOSIS — G629 Polyneuropathy, unspecified: Secondary | ICD-10-CM | POA: Diagnosis not present

## 2022-05-31 DIAGNOSIS — R296 Repeated falls: Secondary | ICD-10-CM | POA: Diagnosis not present

## 2022-05-31 DIAGNOSIS — Z741 Need for assistance with personal care: Secondary | ICD-10-CM | POA: Diagnosis not present

## 2022-05-31 DIAGNOSIS — M6281 Muscle weakness (generalized): Secondary | ICD-10-CM | POA: Diagnosis not present

## 2022-05-31 DIAGNOSIS — Z9181 History of falling: Secondary | ICD-10-CM | POA: Diagnosis not present

## 2022-06-01 NOTE — Telephone Encounter (Signed)
Forms placed in Dr Alla German inbox on his desk to complete upon his return next week.

## 2022-06-02 NOTE — Telephone Encounter (Signed)
Patient called and stated to fax forms to Bari Mantis and fax number is [phone]

## 2022-06-05 DIAGNOSIS — R278 Other lack of coordination: Secondary | ICD-10-CM | POA: Diagnosis not present

## 2022-06-05 DIAGNOSIS — R2689 Other abnormalities of gait and mobility: Secondary | ICD-10-CM | POA: Diagnosis not present

## 2022-06-05 DIAGNOSIS — Z9181 History of falling: Secondary | ICD-10-CM | POA: Diagnosis not present

## 2022-06-05 DIAGNOSIS — Z741 Need for assistance with personal care: Secondary | ICD-10-CM | POA: Diagnosis not present

## 2022-06-05 DIAGNOSIS — G629 Polyneuropathy, unspecified: Secondary | ICD-10-CM | POA: Diagnosis not present

## 2022-06-05 DIAGNOSIS — R296 Repeated falls: Secondary | ICD-10-CM | POA: Diagnosis not present

## 2022-06-05 DIAGNOSIS — M6281 Muscle weakness (generalized): Secondary | ICD-10-CM | POA: Diagnosis not present

## 2022-06-05 NOTE — Telephone Encounter (Signed)
Forms completed and faxed to [facility]

## 2022-06-06 DIAGNOSIS — M6281 Muscle weakness (generalized): Secondary | ICD-10-CM | POA: Diagnosis not present

## 2022-06-06 DIAGNOSIS — R296 Repeated falls: Secondary | ICD-10-CM | POA: Diagnosis not present

## 2022-06-06 DIAGNOSIS — R2689 Other abnormalities of gait and mobility: Secondary | ICD-10-CM | POA: Diagnosis not present

## 2022-06-06 DIAGNOSIS — Z741 Need for assistance with personal care: Secondary | ICD-10-CM | POA: Diagnosis not present

## 2022-06-06 DIAGNOSIS — R278 Other lack of coordination: Secondary | ICD-10-CM | POA: Diagnosis not present

## 2022-06-06 DIAGNOSIS — Z9181 History of falling: Secondary | ICD-10-CM | POA: Diagnosis not present

## 2022-06-06 DIAGNOSIS — G629 Polyneuropathy, unspecified: Secondary | ICD-10-CM | POA: Diagnosis not present

## 2022-06-07 DIAGNOSIS — Z9181 History of falling: Secondary | ICD-10-CM | POA: Diagnosis not present

## 2022-06-07 DIAGNOSIS — R2689 Other abnormalities of gait and mobility: Secondary | ICD-10-CM | POA: Diagnosis not present

## 2022-06-07 DIAGNOSIS — Z741 Need for assistance with personal care: Secondary | ICD-10-CM | POA: Diagnosis not present

## 2022-06-07 DIAGNOSIS — M6281 Muscle weakness (generalized): Secondary | ICD-10-CM | POA: Diagnosis not present

## 2022-06-07 DIAGNOSIS — R278 Other lack of coordination: Secondary | ICD-10-CM | POA: Diagnosis not present

## 2022-06-07 DIAGNOSIS — G629 Polyneuropathy, unspecified: Secondary | ICD-10-CM | POA: Diagnosis not present

## 2022-06-07 DIAGNOSIS — R296 Repeated falls: Secondary | ICD-10-CM | POA: Diagnosis not present

## 2022-06-08 DIAGNOSIS — R278 Other lack of coordination: Secondary | ICD-10-CM | POA: Diagnosis not present

## 2022-06-08 DIAGNOSIS — G629 Polyneuropathy, unspecified: Secondary | ICD-10-CM | POA: Diagnosis not present

## 2022-06-08 DIAGNOSIS — M6281 Muscle weakness (generalized): Secondary | ICD-10-CM | POA: Diagnosis not present

## 2022-06-08 DIAGNOSIS — Z741 Need for assistance with personal care: Secondary | ICD-10-CM | POA: Diagnosis not present

## 2022-06-08 DIAGNOSIS — Z9181 History of falling: Secondary | ICD-10-CM | POA: Diagnosis not present

## 2022-06-08 DIAGNOSIS — R296 Repeated falls: Secondary | ICD-10-CM | POA: Diagnosis not present

## 2022-06-08 DIAGNOSIS — R2689 Other abnormalities of gait and mobility: Secondary | ICD-10-CM | POA: Diagnosis not present

## 2022-06-09 ENCOUNTER — Other Ambulatory Visit (INDEPENDENT_AMBULATORY_CARE_PROVIDER_SITE_OTHER): Payer: Medicare Other

## 2022-06-09 ENCOUNTER — Telehealth: Payer: Self-pay | Admitting: Internal Medicine

## 2022-06-09 ENCOUNTER — Other Ambulatory Visit: Payer: Self-pay

## 2022-06-09 ENCOUNTER — Ambulatory Visit: Payer: Medicare Other

## 2022-06-09 DIAGNOSIS — Z111 Encounter for screening for respiratory tuberculosis: Secondary | ICD-10-CM | POA: Diagnosis not present

## 2022-06-09 NOTE — Telephone Encounter (Signed)
Patients wife called and said patient needs a TB test done in order to get into [facility] on Thursday 06/15/22. Is this okay to schedule. Call back [phone]

## 2022-06-09 NOTE — Telephone Encounter (Signed)
Spoke to pt's wife. Made him an appt today at 3pm. Aware to be here early Monday the 7th to have it read.

## 2022-06-12 DIAGNOSIS — G629 Polyneuropathy, unspecified: Secondary | ICD-10-CM | POA: Diagnosis not present

## 2022-06-12 DIAGNOSIS — R296 Repeated falls: Secondary | ICD-10-CM | POA: Diagnosis not present

## 2022-06-12 DIAGNOSIS — R278 Other lack of coordination: Secondary | ICD-10-CM | POA: Diagnosis not present

## 2022-06-12 DIAGNOSIS — M6281 Muscle weakness (generalized): Secondary | ICD-10-CM | POA: Diagnosis not present

## 2022-06-12 DIAGNOSIS — Z741 Need for assistance with personal care: Secondary | ICD-10-CM | POA: Diagnosis not present

## 2022-06-12 DIAGNOSIS — Z9181 History of falling: Secondary | ICD-10-CM | POA: Diagnosis not present

## 2022-06-12 DIAGNOSIS — R2689 Other abnormalities of gait and mobility: Secondary | ICD-10-CM | POA: Diagnosis not present

## 2022-06-13 ENCOUNTER — Telehealth: Payer: Self-pay | Admitting: Internal Medicine

## 2022-06-13 DIAGNOSIS — R296 Repeated falls: Secondary | ICD-10-CM | POA: Diagnosis not present

## 2022-06-13 DIAGNOSIS — Z741 Need for assistance with personal care: Secondary | ICD-10-CM | POA: Diagnosis not present

## 2022-06-13 DIAGNOSIS — R278 Other lack of coordination: Secondary | ICD-10-CM | POA: Diagnosis not present

## 2022-06-13 DIAGNOSIS — M6281 Muscle weakness (generalized): Secondary | ICD-10-CM | POA: Diagnosis not present

## 2022-06-13 DIAGNOSIS — Z9181 History of falling: Secondary | ICD-10-CM | POA: Diagnosis not present

## 2022-06-13 DIAGNOSIS — G629 Polyneuropathy, unspecified: Secondary | ICD-10-CM | POA: Diagnosis not present

## 2022-06-13 DIAGNOSIS — R2689 Other abnormalities of gait and mobility: Secondary | ICD-10-CM | POA: Diagnosis not present

## 2022-06-13 LAB — QUANTIFERON-TB GOLD PLUS
Mitogen-NIL: >10 IU/mL
NIL: 0.02 IU/mL
QuantiFERON-TB Gold Plus: NEGATIVE
TB1-NIL: 0.01 IU/mL
TB2-NIL: 0.01 IU/mL

## 2022-06-13 NOTE — Telephone Encounter (Signed)
Patient wife Martin Chase was calling in regards to blood work for the [employer] at [employer]. Patsy stated they haven't received anything yet and need it faxed over. Thank you!  Katie Gantos [phone]

## 2022-06-14 DIAGNOSIS — Z741 Need for assistance with personal care: Secondary | ICD-10-CM | POA: Diagnosis not present

## 2022-06-14 DIAGNOSIS — M6281 Muscle weakness (generalized): Secondary | ICD-10-CM | POA: Diagnosis not present

## 2022-06-14 DIAGNOSIS — R296 Repeated falls: Secondary | ICD-10-CM | POA: Diagnosis not present

## 2022-06-14 DIAGNOSIS — Z9181 History of falling: Secondary | ICD-10-CM | POA: Diagnosis not present

## 2022-06-14 DIAGNOSIS — R2689 Other abnormalities of gait and mobility: Secondary | ICD-10-CM | POA: Diagnosis not present

## 2022-06-14 DIAGNOSIS — R278 Other lack of coordination: Secondary | ICD-10-CM | POA: Diagnosis not present

## 2022-06-14 DIAGNOSIS — G629 Polyneuropathy, unspecified: Secondary | ICD-10-CM | POA: Diagnosis not present

## 2022-06-14 NOTE — Telephone Encounter (Signed)
Left message for Martin Chase to call back with the fax number that results need to be faxed to.

## 2022-06-16 ENCOUNTER — Telehealth: Payer: Self-pay | Admitting: Internal Medicine

## 2022-06-16 DIAGNOSIS — Z9181 History of falling: Secondary | ICD-10-CM | POA: Diagnosis not present

## 2022-06-16 DIAGNOSIS — R278 Other lack of coordination: Secondary | ICD-10-CM | POA: Diagnosis not present

## 2022-06-16 DIAGNOSIS — R296 Repeated falls: Secondary | ICD-10-CM | POA: Diagnosis not present

## 2022-06-16 DIAGNOSIS — Z741 Need for assistance with personal care: Secondary | ICD-10-CM | POA: Diagnosis not present

## 2022-06-16 DIAGNOSIS — R2689 Other abnormalities of gait and mobility: Secondary | ICD-10-CM | POA: Diagnosis not present

## 2022-06-16 DIAGNOSIS — G629 Polyneuropathy, unspecified: Secondary | ICD-10-CM | POA: Diagnosis not present

## 2022-06-16 DIAGNOSIS — M6281 Muscle weakness (generalized): Secondary | ICD-10-CM | POA: Diagnosis not present

## 2022-06-16 NOTE — Telephone Encounter (Signed)
Please fax TB results to [phone], attn Bari Mantis

## 2022-06-16 NOTE — Telephone Encounter (Signed)
Please fax TB results to [phone], attn Bari Mantis

## 2022-06-16 NOTE — Telephone Encounter (Signed)
Thank you. I have copied this information to the previous note.

## 2022-06-16 NOTE — Telephone Encounter (Signed)
Quantiferon TB lab routed to [facility] through [employer].

## 2022-06-16 NOTE — Telephone Encounter (Signed)
Left message on verified vm for Martin Chase to see if she received the Quantiferon lab results. If not, I can fax them.

## 2022-06-19 DIAGNOSIS — R278 Other lack of coordination: Secondary | ICD-10-CM | POA: Diagnosis not present

## 2022-06-19 DIAGNOSIS — Z9181 History of falling: Secondary | ICD-10-CM | POA: Diagnosis not present

## 2022-06-19 DIAGNOSIS — Z741 Need for assistance with personal care: Secondary | ICD-10-CM | POA: Diagnosis not present

## 2022-06-19 DIAGNOSIS — R2689 Other abnormalities of gait and mobility: Secondary | ICD-10-CM | POA: Diagnosis not present

## 2022-06-19 DIAGNOSIS — M6281 Muscle weakness (generalized): Secondary | ICD-10-CM | POA: Diagnosis not present

## 2022-06-19 DIAGNOSIS — G629 Polyneuropathy, unspecified: Secondary | ICD-10-CM | POA: Diagnosis not present

## 2022-06-19 DIAGNOSIS — R296 Repeated falls: Secondary | ICD-10-CM | POA: Diagnosis not present

## 2022-06-21 DIAGNOSIS — R296 Repeated falls: Secondary | ICD-10-CM | POA: Diagnosis not present

## 2022-06-21 DIAGNOSIS — R278 Other lack of coordination: Secondary | ICD-10-CM | POA: Diagnosis not present

## 2022-06-21 DIAGNOSIS — M6281 Muscle weakness (generalized): Secondary | ICD-10-CM | POA: Diagnosis not present

## 2022-06-21 DIAGNOSIS — R2689 Other abnormalities of gait and mobility: Secondary | ICD-10-CM | POA: Diagnosis not present

## 2022-06-21 DIAGNOSIS — Z741 Need for assistance with personal care: Secondary | ICD-10-CM | POA: Diagnosis not present

## 2022-06-21 DIAGNOSIS — Z9181 History of falling: Secondary | ICD-10-CM | POA: Diagnosis not present

## 2022-06-21 DIAGNOSIS — G629 Polyneuropathy, unspecified: Secondary | ICD-10-CM | POA: Diagnosis not present

## 2022-06-23 DIAGNOSIS — M6281 Muscle weakness (generalized): Secondary | ICD-10-CM | POA: Diagnosis not present

## 2022-06-23 DIAGNOSIS — G629 Polyneuropathy, unspecified: Secondary | ICD-10-CM | POA: Diagnosis not present

## 2022-06-23 DIAGNOSIS — R278 Other lack of coordination: Secondary | ICD-10-CM | POA: Diagnosis not present

## 2022-06-23 DIAGNOSIS — Z741 Need for assistance with personal care: Secondary | ICD-10-CM | POA: Diagnosis not present

## 2022-06-23 DIAGNOSIS — R2689 Other abnormalities of gait and mobility: Secondary | ICD-10-CM | POA: Diagnosis not present

## 2022-06-23 DIAGNOSIS — Z9181 History of falling: Secondary | ICD-10-CM | POA: Diagnosis not present

## 2022-06-23 DIAGNOSIS — R296 Repeated falls: Secondary | ICD-10-CM | POA: Diagnosis not present

## 2022-06-26 DIAGNOSIS — R2689 Other abnormalities of gait and mobility: Secondary | ICD-10-CM | POA: Diagnosis not present

## 2022-06-26 DIAGNOSIS — M6281 Muscle weakness (generalized): Secondary | ICD-10-CM | POA: Diagnosis not present

## 2022-06-26 DIAGNOSIS — R296 Repeated falls: Secondary | ICD-10-CM | POA: Diagnosis not present

## 2022-06-26 DIAGNOSIS — G629 Polyneuropathy, unspecified: Secondary | ICD-10-CM | POA: Diagnosis not present

## 2022-06-26 DIAGNOSIS — Z741 Need for assistance with personal care: Secondary | ICD-10-CM | POA: Diagnosis not present

## 2022-06-26 DIAGNOSIS — R278 Other lack of coordination: Secondary | ICD-10-CM | POA: Diagnosis not present

## 2022-06-26 DIAGNOSIS — Z9181 History of falling: Secondary | ICD-10-CM | POA: Diagnosis not present

## 2022-06-28 DIAGNOSIS — R278 Other lack of coordination: Secondary | ICD-10-CM | POA: Diagnosis not present

## 2022-06-28 DIAGNOSIS — M6281 Muscle weakness (generalized): Secondary | ICD-10-CM | POA: Diagnosis not present

## 2022-06-28 DIAGNOSIS — Z741 Need for assistance with personal care: Secondary | ICD-10-CM | POA: Diagnosis not present

## 2022-06-28 DIAGNOSIS — Z9181 History of falling: Secondary | ICD-10-CM | POA: Diagnosis not present

## 2022-06-28 DIAGNOSIS — R296 Repeated falls: Secondary | ICD-10-CM | POA: Diagnosis not present

## 2022-06-28 DIAGNOSIS — R2689 Other abnormalities of gait and mobility: Secondary | ICD-10-CM | POA: Diagnosis not present

## 2022-06-28 DIAGNOSIS — G629 Polyneuropathy, unspecified: Secondary | ICD-10-CM | POA: Diagnosis not present

## 2022-07-03 DIAGNOSIS — Z9181 History of falling: Secondary | ICD-10-CM | POA: Diagnosis not present

## 2022-07-03 DIAGNOSIS — M6281 Muscle weakness (generalized): Secondary | ICD-10-CM | POA: Diagnosis not present

## 2022-07-03 DIAGNOSIS — R2689 Other abnormalities of gait and mobility: Secondary | ICD-10-CM | POA: Diagnosis not present

## 2022-07-03 DIAGNOSIS — R296 Repeated falls: Secondary | ICD-10-CM | POA: Diagnosis not present

## 2022-07-03 DIAGNOSIS — G629 Polyneuropathy, unspecified: Secondary | ICD-10-CM | POA: Diagnosis not present

## 2022-07-03 DIAGNOSIS — Z741 Need for assistance with personal care: Secondary | ICD-10-CM | POA: Diagnosis not present

## 2022-07-03 DIAGNOSIS — R278 Other lack of coordination: Secondary | ICD-10-CM | POA: Diagnosis not present

## 2022-07-05 DIAGNOSIS — M6281 Muscle weakness (generalized): Secondary | ICD-10-CM | POA: Diagnosis not present

## 2022-07-05 DIAGNOSIS — Z741 Need for assistance with personal care: Secondary | ICD-10-CM | POA: Diagnosis not present

## 2022-07-05 DIAGNOSIS — Z9181 History of falling: Secondary | ICD-10-CM | POA: Diagnosis not present

## 2022-07-05 DIAGNOSIS — G629 Polyneuropathy, unspecified: Secondary | ICD-10-CM | POA: Diagnosis not present

## 2022-07-05 DIAGNOSIS — R278 Other lack of coordination: Secondary | ICD-10-CM | POA: Diagnosis not present

## 2022-07-05 DIAGNOSIS — R296 Repeated falls: Secondary | ICD-10-CM | POA: Diagnosis not present

## 2022-07-05 DIAGNOSIS — R2689 Other abnormalities of gait and mobility: Secondary | ICD-10-CM | POA: Diagnosis not present

## 2022-07-10 DIAGNOSIS — R2689 Other abnormalities of gait and mobility: Secondary | ICD-10-CM | POA: Diagnosis not present

## 2022-07-10 DIAGNOSIS — Z9181 History of falling: Secondary | ICD-10-CM | POA: Diagnosis not present

## 2022-07-10 DIAGNOSIS — R296 Repeated falls: Secondary | ICD-10-CM | POA: Diagnosis not present

## 2022-07-10 DIAGNOSIS — R278 Other lack of coordination: Secondary | ICD-10-CM | POA: Diagnosis not present

## 2022-07-10 DIAGNOSIS — M6281 Muscle weakness (generalized): Secondary | ICD-10-CM | POA: Diagnosis not present

## 2022-07-10 DIAGNOSIS — Z741 Need for assistance with personal care: Secondary | ICD-10-CM | POA: Diagnosis not present

## 2022-07-10 DIAGNOSIS — G629 Polyneuropathy, unspecified: Secondary | ICD-10-CM | POA: Diagnosis not present

## 2022-07-12 DIAGNOSIS — Z741 Need for assistance with personal care: Secondary | ICD-10-CM | POA: Diagnosis not present

## 2022-07-12 DIAGNOSIS — R296 Repeated falls: Secondary | ICD-10-CM | POA: Diagnosis not present

## 2022-07-12 DIAGNOSIS — G629 Polyneuropathy, unspecified: Secondary | ICD-10-CM | POA: Diagnosis not present

## 2022-07-12 DIAGNOSIS — M6281 Muscle weakness (generalized): Secondary | ICD-10-CM | POA: Diagnosis not present

## 2022-07-12 DIAGNOSIS — R278 Other lack of coordination: Secondary | ICD-10-CM | POA: Diagnosis not present

## 2022-07-12 DIAGNOSIS — Z9181 History of falling: Secondary | ICD-10-CM | POA: Diagnosis not present

## 2022-07-12 DIAGNOSIS — R2689 Other abnormalities of gait and mobility: Secondary | ICD-10-CM | POA: Diagnosis not present

## 2022-07-17 DIAGNOSIS — Z741 Need for assistance with personal care: Secondary | ICD-10-CM | POA: Diagnosis not present

## 2022-07-17 DIAGNOSIS — R278 Other lack of coordination: Secondary | ICD-10-CM | POA: Diagnosis not present

## 2022-07-17 DIAGNOSIS — R296 Repeated falls: Secondary | ICD-10-CM | POA: Diagnosis not present

## 2022-07-17 DIAGNOSIS — M6281 Muscle weakness (generalized): Secondary | ICD-10-CM | POA: Diagnosis not present

## 2022-07-17 DIAGNOSIS — G629 Polyneuropathy, unspecified: Secondary | ICD-10-CM | POA: Diagnosis not present

## 2022-07-17 DIAGNOSIS — R2689 Other abnormalities of gait and mobility: Secondary | ICD-10-CM | POA: Diagnosis not present

## 2022-07-17 DIAGNOSIS — Z9181 History of falling: Secondary | ICD-10-CM | POA: Diagnosis not present

## 2022-07-19 DIAGNOSIS — R2689 Other abnormalities of gait and mobility: Secondary | ICD-10-CM | POA: Diagnosis not present

## 2022-07-19 DIAGNOSIS — R296 Repeated falls: Secondary | ICD-10-CM | POA: Diagnosis not present

## 2022-07-19 DIAGNOSIS — M6281 Muscle weakness (generalized): Secondary | ICD-10-CM | POA: Diagnosis not present

## 2022-07-19 DIAGNOSIS — Z9181 History of falling: Secondary | ICD-10-CM | POA: Diagnosis not present

## 2022-07-19 DIAGNOSIS — R278 Other lack of coordination: Secondary | ICD-10-CM | POA: Diagnosis not present

## 2022-07-19 DIAGNOSIS — G629 Polyneuropathy, unspecified: Secondary | ICD-10-CM | POA: Diagnosis not present

## 2022-07-19 DIAGNOSIS — Z741 Need for assistance with personal care: Secondary | ICD-10-CM | POA: Diagnosis not present

## 2022-07-24 DIAGNOSIS — R278 Other lack of coordination: Secondary | ICD-10-CM | POA: Diagnosis not present

## 2022-07-24 DIAGNOSIS — M6281 Muscle weakness (generalized): Secondary | ICD-10-CM | POA: Diagnosis not present

## 2022-07-24 DIAGNOSIS — R2689 Other abnormalities of gait and mobility: Secondary | ICD-10-CM | POA: Diagnosis not present

## 2022-07-24 DIAGNOSIS — Z9181 History of falling: Secondary | ICD-10-CM | POA: Diagnosis not present

## 2022-07-24 DIAGNOSIS — G629 Polyneuropathy, unspecified: Secondary | ICD-10-CM | POA: Diagnosis not present

## 2022-07-24 DIAGNOSIS — Z741 Need for assistance with personal care: Secondary | ICD-10-CM | POA: Diagnosis not present

## 2022-07-24 DIAGNOSIS — R296 Repeated falls: Secondary | ICD-10-CM | POA: Diagnosis not present

## 2022-07-26 DIAGNOSIS — R278 Other lack of coordination: Secondary | ICD-10-CM | POA: Diagnosis not present

## 2022-07-26 DIAGNOSIS — R2689 Other abnormalities of gait and mobility: Secondary | ICD-10-CM | POA: Diagnosis not present

## 2022-07-26 DIAGNOSIS — G629 Polyneuropathy, unspecified: Secondary | ICD-10-CM | POA: Diagnosis not present

## 2022-07-26 DIAGNOSIS — R296 Repeated falls: Secondary | ICD-10-CM | POA: Diagnosis not present

## 2022-07-26 DIAGNOSIS — M6281 Muscle weakness (generalized): Secondary | ICD-10-CM | POA: Diagnosis not present

## 2022-07-26 DIAGNOSIS — Z9181 History of falling: Secondary | ICD-10-CM | POA: Diagnosis not present

## 2022-07-26 DIAGNOSIS — Z741 Need for assistance with personal care: Secondary | ICD-10-CM | POA: Diagnosis not present

## 2022-07-31 DIAGNOSIS — R2689 Other abnormalities of gait and mobility: Secondary | ICD-10-CM | POA: Diagnosis not present

## 2022-07-31 DIAGNOSIS — M6281 Muscle weakness (generalized): Secondary | ICD-10-CM | POA: Diagnosis not present

## 2022-07-31 DIAGNOSIS — G629 Polyneuropathy, unspecified: Secondary | ICD-10-CM | POA: Diagnosis not present

## 2022-07-31 DIAGNOSIS — Z9181 History of falling: Secondary | ICD-10-CM | POA: Diagnosis not present

## 2022-07-31 DIAGNOSIS — R296 Repeated falls: Secondary | ICD-10-CM | POA: Diagnosis not present

## 2022-07-31 DIAGNOSIS — Z741 Need for assistance with personal care: Secondary | ICD-10-CM | POA: Diagnosis not present

## 2022-07-31 DIAGNOSIS — R278 Other lack of coordination: Secondary | ICD-10-CM | POA: Diagnosis not present

## 2022-08-02 DIAGNOSIS — M6281 Muscle weakness (generalized): Secondary | ICD-10-CM | POA: Diagnosis not present

## 2022-08-02 DIAGNOSIS — Z741 Need for assistance with personal care: Secondary | ICD-10-CM | POA: Diagnosis not present

## 2022-08-02 DIAGNOSIS — G629 Polyneuropathy, unspecified: Secondary | ICD-10-CM | POA: Diagnosis not present

## 2022-08-02 DIAGNOSIS — R296 Repeated falls: Secondary | ICD-10-CM | POA: Diagnosis not present

## 2022-08-02 DIAGNOSIS — R2689 Other abnormalities of gait and mobility: Secondary | ICD-10-CM | POA: Diagnosis not present

## 2022-08-02 DIAGNOSIS — Z9181 History of falling: Secondary | ICD-10-CM | POA: Diagnosis not present

## 2022-08-02 DIAGNOSIS — R278 Other lack of coordination: Secondary | ICD-10-CM | POA: Diagnosis not present

## 2022-08-07 ENCOUNTER — Ambulatory Visit: Payer: Medicare Other | Admitting: Pulmonary Disease

## 2022-08-07 ENCOUNTER — Telehealth: Payer: Self-pay | Admitting: Pulmonary Disease

## 2022-08-07 ENCOUNTER — Encounter: Payer: Self-pay | Admitting: Pulmonary Disease

## 2022-08-07 VITALS — BP 110/68 | HR 65 | Temp 98.5°F | Ht 71.0 in | Wt 213.0 lb

## 2022-08-07 DIAGNOSIS — G4733 Obstructive sleep apnea (adult) (pediatric): Secondary | ICD-10-CM

## 2022-08-07 NOTE — Progress Notes (Signed)
Subjective:     Patient ID: Martin Chase, male   DOB: 03-Nov-1946, 76 y.o.   MRN: [id number]  History of obstructive sleep apnea diagnosed about 11 years ago  Severe obstructive sleep apnea with very good compliance with CPAP No significant health issues since the last time he was here  Has not had any significant issues with CPAP Wakes up feeling like he is at a good nights rest Continues to benefit from CPAP use  Denies any significant dryness of his mouth in the mornings, no morning headaches, memory continues to be stable  Usually goes to bed about 10 PM, final awakening time about 8 AM Wakes up about twice during the night to use the restroom  No family history of obstructive sleep apnea  Occasional difficulty staying asleep   Past Medical History:  Diagnosis Date   Allergy    Anemia 1996   due to a bleeding from aspirin   Arthritis    Cataract    History of blood transfusion 1996   HOH (hard of hearing)    uses hearing aids/ hearing on left side worse   Hyperlipidemia    Hypertension    Prediabetes 10/20/2014   Prostate cancer ([place])    Sleep apnea    uses c-pap   Spinal stenosis    Social History   Socioeconomic History   Marital status: Married    Spouse name: Patsy   Number of children: 0   Years of education: college   Highest education level: Not on file  Occupational History   Occupation: [employer]    Comment: Retired   Occupation: Customer service manager    Comment: retired  Tobacco Use   Smoking status: Former    Packs/day: 1.00    Years: 5.00    Total pack years: 5.00    Types: Cigarettes    Quit date: 01/29/1986    Years since quitting: 36.5   Smokeless tobacco: Never  Vaping Use   Vaping Use: Never used  Substance and Sexual Activity   Alcohol use: Yes    Alcohol/week: 7.0 standard drinks of alcohol    Types: 7 Glasses of wine per week    Comment: 1 glass of wine with dinner   Drug use: No   Sexual activity: Not  Currently  Other Topics Concern   Not on file  Social History Narrative   Lives with wife   Household -- pt and wife   Caffeine use: 2 cups coffee per day   Right handed       Has living will   Wife is health care POA. Alternate would be Trilby Leaver niece   Would accept resuscitation   No feeding tube if cognitively unaware   Social Determinants of Health   Financial Resource Strain: Low Risk  (12/13/2021)   Overall Financial Resource Strain (CARDIA)    Difficulty of Paying Living Expenses: Not hard at all  Food Insecurity: No Food Insecurity (07/06/2021)   Hunger Vital Sign    Worried About Running Out of Food in the Last Year: Never true    Ran Out of Food in the Last Year: Never true  Transportation Needs: No Transportation Needs (12/19/2021)   PRAPARE - Hydrologist (Medical): No    Lack of Transportation (Non-Medical): No  Physical Activity: Insufficiently Active (12/13/2021)   Exercise Vital Sign    Days of Exercise per Week: 3 days    Minutes of Exercise per Session:  30 min  Stress: Not on file  Social Connections: Socially Isolated (12/19/2021)   Social Connection and Isolation Panel [NHANES]    Frequency of Communication with Friends and Family: Never    Frequency of Social Gatherings with Friends and Family: Once a week    Attends Religious Services: Never    Marine scientist or Organizations: No    Attends Archivist Meetings: Never    Marital Status: Married  Human resources officer Violence: Not on file   Family History  Problem Relation Age of Onset   Diabetes Mother    Alzheimer's disease Mother    Prostate cancer Father 56       prostatectomy?   Diabetes Maternal Uncle    Stroke Paternal Uncle    Diabetes Maternal Grandmother    Heart disease Maternal Grandfather    Suicidality Brother    Colon cancer Neg Hx    Rectal cancer Neg Hx    Esophageal cancer Neg Hx    Stomach cancer Neg Hx    Colon polyps Neg Hx       Review of Systems  Constitutional:  Negative for fever and unexpected weight change.  HENT:  Negative for congestion, dental problem, ear pain, nosebleeds, postnasal drip, rhinorrhea, sinus pressure, sneezing, sore throat and trouble swallowing.   Eyes:  Negative for redness and itching.  Respiratory:  Negative for cough, chest tightness, shortness of breath and wheezing.   Cardiovascular:  Negative for palpitations and leg swelling.  Gastrointestinal:  Negative for nausea and vomiting.  Genitourinary:  Negative for dysuria.  Musculoskeletal:  Negative for joint swelling.  Skin:  Negative for rash.  Allergic/Immunologic: Negative.  Negative for environmental allergies, food allergies and immunocompromised state.  Neurological:  Negative for headaches.  Hematological:  Does not bruise/bleed easily.  Psychiatric/Behavioral:  Negative for dysphoric mood. The patient is not nervous/anxious.        Objective:   Physical Exam Constitutional:      Appearance: Normal appearance.  HENT:     Head: Normocephalic and atraumatic.     Nose: Nose normal.     Mouth/Throat:     Mouth: Mucous membranes are moist.     Comments: Mallampati 3, crowded oropharynx Eyes:     General:        Right eye: No discharge.     Extraocular Movements: Extraocular movements intact.     Pupils: Pupils are equal, round, and reactive to light.  Cardiovascular:     Rate and Rhythm: Normal rate and regular rhythm.     Pulses: Normal pulses.     Heart sounds: Normal heart sounds. No murmur heard. Pulmonary:     Effort: Pulmonary effort is normal. No respiratory distress.     Breath sounds: Normal breath sounds. No stridor. No wheezing or rhonchi.  Musculoskeletal:     Cervical back: No rigidity or tenderness. No muscular tenderness.  Neurological:     Mental Status: He is alert.  Psychiatric:        Mood and Affect: Mood normal.       07/21/2019   10:00 AM  Results of the Epworth flowsheet  Sitting  and reading 3  Watching TV 3  Sitting, inactive in a public place (e.g. a theatre or a meeting) 0  As a passenger in a car for an hour without a break 0  Lying down to rest in the afternoon when circumstances permit 3  Sitting and talking to someone 0  Sitting quietly after  a lunch without alcohol 0  In a car, while stopped for a few minutes in traffic 0  Total score 9      Assessment:     Severe obstructive sleep apnea -Compliant with CPAP use -Continues to benefit from CPAP -No significant issues with tolerating CPAP  Daytime sleepiness -Appears to be stable     Plan:     Continue nightly CPAP use  No significant changes to treatment  Prescription for CPAP supplies-new mask  Schedule follow-up appointment at the [facility] office for year

## 2022-08-07 NOTE — Telephone Encounter (Signed)
This patient was in the office and he wants to see if you would be ok with him seeing you in [place]? Dr. Jenetta Downer said he is ok with the switch as [place] is closer for them. Please advise.

## 2022-08-07 NOTE — Patient Instructions (Signed)
DME referral for CPAP supplies-needs new mask  Schedule follow-up appointment for year from now at [facility] office  Call us with any significant concerns

## 2022-08-07 NOTE — Telephone Encounter (Signed)
Noted. Nothing further needed. 

## 2022-08-07 NOTE — Telephone Encounter (Signed)
That’s fine with me

## 2022-08-09 DIAGNOSIS — Z741 Need for assistance with personal care: Secondary | ICD-10-CM | POA: Diagnosis not present

## 2022-08-09 DIAGNOSIS — Z9181 History of falling: Secondary | ICD-10-CM | POA: Diagnosis not present

## 2022-08-09 DIAGNOSIS — G629 Polyneuropathy, unspecified: Secondary | ICD-10-CM | POA: Diagnosis not present

## 2022-08-09 DIAGNOSIS — M6281 Muscle weakness (generalized): Secondary | ICD-10-CM | POA: Diagnosis not present

## 2022-08-09 DIAGNOSIS — R278 Other lack of coordination: Secondary | ICD-10-CM | POA: Diagnosis not present

## 2022-08-09 DIAGNOSIS — R2689 Other abnormalities of gait and mobility: Secondary | ICD-10-CM | POA: Diagnosis not present

## 2022-08-09 DIAGNOSIS — R296 Repeated falls: Secondary | ICD-10-CM | POA: Diagnosis not present

## 2022-08-17 ENCOUNTER — Ambulatory Visit: Payer: Medicare Other | Admitting: Internal Medicine

## 2022-09-06 DIAGNOSIS — R3914 Feeling of incomplete bladder emptying: Secondary | ICD-10-CM | POA: Diagnosis not present

## 2022-09-18 DIAGNOSIS — R3914 Feeling of incomplete bladder emptying: Secondary | ICD-10-CM | POA: Diagnosis not present

## 2022-09-21 ENCOUNTER — Telehealth: Payer: Self-pay | Admitting: Internal Medicine

## 2022-09-21 NOTE — Telephone Encounter (Signed)
Form faxed back.

## 2022-09-21 NOTE — Telephone Encounter (Signed)
Elijah from [facility] called over and stated that patient is moving up to a higher level of care. A FL2 was faxed over this morning for him and they are needing it back by tomorrow. Thank you!

## 2022-09-25 ENCOUNTER — Non-Acute Institutional Stay: Payer: Medicare Other | Admitting: Student

## 2022-09-25 ENCOUNTER — Encounter: Payer: Self-pay | Admitting: Student

## 2022-09-25 DIAGNOSIS — G6181 Chronic inflammatory demyelinating polyneuritis: Secondary | ICD-10-CM | POA: Diagnosis not present

## 2022-09-25 DIAGNOSIS — F01A0 Vascular dementia, mild, without behavioral disturbance, psychotic disturbance, mood disturbance, and anxiety: Secondary | ICD-10-CM | POA: Diagnosis not present

## 2022-09-25 DIAGNOSIS — I10 Essential (primary) hypertension: Secondary | ICD-10-CM | POA: Diagnosis not present

## 2022-09-25 DIAGNOSIS — N400 Enlarged prostate without lower urinary tract symptoms: Secondary | ICD-10-CM

## 2022-09-25 DIAGNOSIS — Z7189 Other specified counseling: Secondary | ICD-10-CM | POA: Diagnosis not present

## 2022-09-25 DIAGNOSIS — E119 Type 2 diabetes mellitus without complications: Secondary | ICD-10-CM

## 2022-09-25 NOTE — Progress Notes (Unsigned)
Provider:   Location:  Other Nursing Home Room Number: [place] of Service:  ALF ([phone])  PCP: Dewayne Shorter, MD Patient Care Team: Dewayne Shorter, MD as PCP - General (Family Medicine) Berle Mull, MD as Consulting Physician (Sports Medicine) Armbruster, Carlota Raspberry, MD as Consulting Physician (Gastroenterology) Festus Aloe, MD as Consulting Physician (Urology) Cira Rue, RN Nurse Navigator as Registered Nurse (Medical Oncology) [name], Nanine Means, MD (Neurology) Consuella Lose, MD as Consulting Physician (Neurosurgery)  Extended Emergency Contact Information Primary Emergency Contact: Midge Minium Address: [address]          [place] Phone: [phone] Mobile Phone: [phone] Relation: Spouse  Code Status: DNR Goals of Care: Advanced Directive information    12/19/2021    3:10 PM  Advanced Directives  Does Patient Have a Medical Advance Directive? Yes  Type of Paramedic of [place];Living will  Copy of [place] in Chart? No - copy requested      Chief Complaint  Patient presents with   New Admit to ALF    New Admit to ALF    HPI: Patient is a 76 y.o. male seen today for admission to memory care. He is here with his wife. He has had increased number of falls 8 in the last 6 months to be exact. He has been in physical therapy. He has a rollator and nursing states he has required 2 person assist to ambulate with walker and has used improperly. He requires assistance for bathing, medications.   He is born and raised from [place] and worked for [employer] for 30 years.  Past Medical History:  Diagnosis Date   Allergy    Anemia 1996   due to a bleeding from aspirin   Arthritis    Cataract    History of blood transfusion 1996   HOH (hard of hearing)    uses hearing aids/ hearing on left side worse   Hyperlipidemia    Hypertension     Prediabetes 10/20/2014   Prostate cancer [facility])    Sleep apnea    uses c-pap   Spinal stenosis    Past Surgical History:  Procedure Laterality Date   COLONOSCOPY  07/30/2019   COLONOSCOPY WITH PROPOFOL N/A 09/16/2018   Procedure: COLONOSCOPY WITH PROPOFOL;  Surgeon: Yetta Flock, MD;  Location: [facility] ENDOSCOPY;  Service: Gastroenterology;  Laterality: N/A;   POLYPECTOMY  09/16/2018   Procedure: POLYPECTOMY;  Surgeon: Yetta Flock, MD;  Location: [facility] ENDOSCOPY;  Service: Gastroenterology;;   POLYPECTOMY     SUBMUCOSAL INJECTION  09/16/2018   Procedure: SUBMUCOSAL INJECTION;  Surgeon: Yetta Flock, MD;  Location: [facility] ENDOSCOPY;  Service: Gastroenterology;;   TONSILLECTOMY     VASECTOMY      reports that he quit smoking about 36 years ago. His smoking use included cigarettes. He has a 5.00 pack-year smoking history. He has never used smokeless tobacco. He reports current alcohol use of about 7.0 standard drinks of alcohol per week. He reports that he does not use drugs. Social History   Socioeconomic History   Marital status: Married    Spouse name: Patsy   Number of children: 0   Years of education: college   Highest education level: Not on file  Occupational History   Occupation: [employer]    Comment: Retired   Occupation: Customer service manager    Comment: retired  Tobacco Use   Smoking status: Former  Packs/day: 1.00    Years: 5.00    Total pack years: 5.00    Types: Cigarettes    Quit date: 01/29/1986    Years since quitting: 36.6   Smokeless tobacco: Never  Vaping Use   Vaping Use: Never used  Substance and Sexual Activity   Alcohol use: Yes    Alcohol/week: 7.0 standard drinks of alcohol    Types: 7 Glasses of wine per week    Comment: 1 glass of wine with dinner   Drug use: No   Sexual activity: Not Currently  Other Topics Concern   Not on file  Social History Narrative   Lives with wife   Household -- pt and wife    Caffeine use: 2 cups coffee per day   Right handed       Has living will   Wife is health care POA. Alternate would be Trilby Leaver niece   Would accept resuscitation   No feeding tube if cognitively unaware   Social Determinants of Health   Financial Resource Strain: Low Risk  (12/13/2021)   Overall Financial Resource Strain (CARDIA)    Difficulty of Paying Living Expenses: Not hard at all  Food Insecurity: No Food Insecurity (07/06/2021)   Hunger Vital Sign    Worried About Running Out of Food in the Last Year: Never true    Ran Out of Food in the Last Year: Never true  Transportation Needs: No Transportation Needs (12/19/2021)   PRAPARE - Hydrologist (Medical): No    Lack of Transportation (Non-Medical): No  Physical Activity: Insufficiently Active (12/13/2021)   Exercise Vital Sign    Days of Exercise per Week: 3 days    Minutes of Exercise per Session: 30 min  Stress: Not on file  Social Connections: Socially Isolated (12/19/2021)   Social Connection and Isolation Panel [NHANES]    Frequency of Communication with Friends and Family: Never    Frequency of Social Gatherings with Friends and Family: Once a week    Attends Religious Services: Never    Marine scientist or Organizations: No    Attends Archivist Meetings: Never    Marital Status: Married  Human resources officer Violence: Not on file    Functional Status Survey:    Family History  Problem Relation Age of Onset   Diabetes Mother    Alzheimer's disease Mother    Prostate cancer Father 82       prostatectomy?   Diabetes Maternal Uncle    Stroke Paternal Uncle    Diabetes Maternal Grandmother    Heart disease Maternal Grandfather    Suicidality Brother    Colon cancer Neg Hx    Rectal cancer Neg Hx    Esophageal cancer Neg Hx    Stomach cancer Neg Hx    Colon polyps Neg Hx     Health Maintenance  Topic Date Due   FOOT EXAM  Never done   OPHTHALMOLOGY EXAM   Never done   Diabetic kidney evaluation - Urine ACR  Never done   COVID-19 Vaccine (5 - 2023-24 season) 07/07/2022   HEMOGLOBIN A1C  08/17/2022   INFLUENZA VACCINE  02/04/2023 (Originally 06/06/2022)   Medicare Annual Wellness (AWV)  12/19/2022   Diabetic kidney evaluation - GFR measurement  02/16/2023   COLONOSCOPY (Pts 45-53yr Insurance coverage will need to be confirmed)  06/23/2023   Pneumonia Vaccine 76 Years old  Completed   Hepatitis C Screening  Completed  Zoster Vaccines- Shingrix  Completed   HPV VACCINES  Aged Out    No Known Allergies  Outpatient Encounter Medications as of 09/25/2022  Medication Sig   atorvastatin (LIPITOR) 20 MG tablet TAKE 1 TABLET BY MOUTH ONCE  DAILY   Calcium 200 MG TABS Give one by mouth once daily.   Cholecalciferol (VITAMIN D3) 50 MCG (2000 UT) CAPS Take by mouth.   donepezil (ARICEPT) 10 MG tablet Take 1 tablet (10 mg total) by mouth at bedtime.   finasteride (PROSCAR) 5 MG tablet Take 5 mg by mouth daily.   fluticasone (VERAMYST) 27.5 MCG/SPRAY nasal spray Place 1 spray into the nose daily.   folic acid (FOLVITE) 528 MCG tablet Take 800 mcg by mouth daily.   furosemide (LASIX) 20 MG tablet Take 20 mg by mouth daily as needed.   losartan (COZAAR) 50 MG tablet TAKE 1 TABLET BY MOUTH  DAILY   memantine (NAMENDA) 10 MG tablet Take 1 tablet (10 mg total) by mouth 2 (two) times daily.   Multiple Vitamin (MULTIVITAMIN WITH MINERALS) TABS tablet Take 1 tablet by mouth daily. Centrum   Probiotic Product (PROBIOTIC PO) Take 1 Dose by mouth daily.   SUPER B COMPLEX & C TABS Give one tablet by mouth one time daily   tamsulosin (FLOMAX) 0.4 MG CAPS capsule Take 0.4 mg by mouth daily after supper.   [DISCONTINUED] Calcium Carbonate-Vit D-Min (CALCIUM 1200 PO) Take 1 tablet by mouth in the morning and at bedtime.   [DISCONTINUED] Cyanocobalamin (VITAMIN B-12) 5000 MCG TBDP Take 2,500 mcg by mouth daily.   [DISCONTINUED] triamcinolone cream (KENALOG) 0.1 %  Apply 1 application. topically 2 (two) times daily as needed.   No facility-administered encounter medications on file as of 09/25/2022.    Review of Systems  Vitals:   09/25/22 1729  BP: 131/62  Pulse: 71  Resp: 20  Temp: 97.7 F (36.5 C)  Weight: 213 lb (96.6 kg)  Height: '5\' 11"'$  (1.803 m)   Body mass index is 29.71 kg/m. Physical Exam Constitutional:      Appearance: Normal appearance.  Cardiovascular:     Rate and Rhythm: Normal rate and regular rhythm.     Pulses: Normal pulses.     Heart sounds: Normal heart sounds.  Pulmonary:     Effort: Pulmonary effort is normal.     Breath sounds: Normal breath sounds.  Abdominal:     General: Abdomen is flat.  Skin:    General: Skin is warm.  Neurological:     General: No focal deficit present.     Mental Status: He is alert. Mental status is at baseline.     Comments: States we are at [facility]. Knows his birthday, but doesn't realize it is today.      Labs reviewed: Basic Metabolic Panel: Recent Labs    10/06/21 1147 02/15/22 1059  NA 142 140  K 4.7 5.0  CL 104 101  CO2 32 32  GLUCOSE 105* 106*  BUN 24* 19  CREATININE 0.94 0.96  CALCIUM 9.0 9.1   Liver Function Tests: Recent Labs    02/15/22 1059  AST 22  ALT 17  ALKPHOS 69  BILITOT 0.7  PROT 6.8  ALBUMIN 4.3   No results for input(s): "LIPASE", "AMYLASE" in the last 8760 hours. No results for input(s): "AMMONIA" in the last 8760 hours. CBC: Recent Labs    02/15/22 1059  WBC 6.9  NEUTROABS 4.7  HGB 13.6  HCT 41.3  MCV 90.9  PLT 239.0   Cardiac Enzymes: No results for input(s): "CKTOTAL", "CKMB", "CKMBINDEX", "TROPONINI" in the last 8760 hours. BNP: Invalid input(s): "POCBNP" Lab Results  Component Value Date   HGBA1C 6.3 02/15/2022   Lab Results  Component Value Date   TSH 6.11 (H) 02/15/2022   Lab Results  Component Value Date   VITAMINB12 >2000 (H) 09/21/2020   Lab Results  Component Value Date   FOLATE >24.1 11/03/2019    No results found for: "IRON", "TIBC", "FERRITIN"  Imaging and Procedures obtained prior to SNF admission: DG FL GUIDED LUMBAR PUNCTURE  Result Date: 02/18/2021 CLINICAL DATA:  Demyelinating polyneuropathy EXAM: DIAGNOSTIC LUMBAR PUNCTURE UNDER FLUOROSCOPIC GUIDANCE FLUOROSCOPY TIME:  36 seconds; 53  uGym2 DAP PROCEDURE: Informed consent was obtained from the patient prior to the procedure, including potential complications of headache, allergy, and pain. With the patient prone, the lower back was prepped with Betadine. 1% Lidocaine was used for local anesthesia. Lumbar puncture was performed at the L4 level initially from a right parasagittal approach, but less than 1 mL of fluid returned, inadequate for requested studies and pressure measurements. For that reason, a new sterile 20 gauge needle was advanced from a left parasagittal approach at the L3 level with return of clear colorless CSF with an opening pressure of 12 cm water. 8 ml of CSF were obtained for laboratory studies. The patient tolerated the procedure well and there were no apparent complications. IMPRESSION: 1. Technically successful lumbar puncture under fluoroscopy Electronically Signed   By: Lucrezia Europe M.D.   On: 02/18/2021 14:06    Assessment/Plan Mild vascular dementia without behavioral disturbance, psychotic disturbance, mood disturbance, or anxiety ([place]) Memory changes have progressed to the point where patient requires assistance with medications, finances. He is continent of urine and stool. Requires assistance with dressing and bathing. Now under memory care. Will continue memantine and donepezil.   Diabetes mellitus without complication (HCC) Continue atorvastatin 20 mg daily.   Primary hypertension BP well-controlled at this time on current regimen. Continue losartan 50 mg daily.   Chronic inflammatory demyelinating polyneuritis [facility]) Patient followed by neurology. Difficulty with ambulation and use of rollator. PT  to eval and treat.   Goals of care Wife is HCPOA and this should be initiated at this time given patient's progression of dementia. He states he would not like to be rescuciated and she also agrees that this should be updated for him. Discussed concern that patient's current level of assistance required could mean he should move to a higher level of care. Will reorder physical therapy for evaluation to determine whether or not he will be able to maintain adequate level of independence to live in the memory care unit.   BPH Continue tamsuplosin 0.4 mg daily. Followed by urology  Family/ staff Communication: wife and nursing  Labs/tests ordered:CBC, CMP, UA/UCx

## 2022-09-27 DIAGNOSIS — L821 Other seborrheic keratosis: Secondary | ICD-10-CM | POA: Diagnosis not present

## 2022-09-27 DIAGNOSIS — L820 Inflamed seborrheic keratosis: Secondary | ICD-10-CM | POA: Diagnosis not present

## 2022-09-27 DIAGNOSIS — Z7189 Other specified counseling: Secondary | ICD-10-CM | POA: Insufficient documentation

## 2022-09-27 DIAGNOSIS — D492 Neoplasm of unspecified behavior of bone, soft tissue, and skin: Secondary | ICD-10-CM | POA: Diagnosis not present

## 2022-09-27 DIAGNOSIS — Z515 Encounter for palliative care: Secondary | ICD-10-CM | POA: Insufficient documentation

## 2022-09-27 DIAGNOSIS — L570 Actinic keratosis: Secondary | ICD-10-CM | POA: Diagnosis not present

## 2022-10-02 ENCOUNTER — Ambulatory Visit: Payer: Medicare Other | Admitting: Internal Medicine

## 2022-10-02 DIAGNOSIS — R030 Elevated blood-pressure reading, without diagnosis of hypertension: Secondary | ICD-10-CM | POA: Diagnosis not present

## 2022-10-02 LAB — COMPREHENSIVE METABOLIC PANEL
Albumin: 4 (ref 3.5–5.0)
Calcium: 8.8 (ref 8.7–10.7)
Globulin: 2.6

## 2022-10-02 LAB — HEPATIC FUNCTION PANEL
ALT: 18 U/L (ref 10–40)
AST: 21 (ref 14–40)
Alkaline Phosphatase: 70 (ref 25–125)
Bilirubin, Total: 0.8

## 2022-10-02 LAB — CBC AND DIFFERENTIAL
HCT: 41 (ref 41–53)
Hemoglobin: 13.7 (ref 13.5–17.5)
Neutrophils Absolute: 5686
Platelets: 253 10*3/uL (ref 150–400)
WBC: 8.1

## 2022-10-02 LAB — BASIC METABOLIC PANEL
BUN: 25 — AB (ref 4–21)
CO2: 31 — AB (ref 13–22)
Chloride: 104 (ref 99–108)
Creatinine: 1.1 (ref 0.6–1.3)
Glucose: 96
Potassium: 4.5 mEq/L (ref 3.5–5.1)
Sodium: 142 (ref 137–147)

## 2022-10-02 LAB — CBC: RBC: 4.53 (ref 3.87–5.11)

## 2022-10-17 ENCOUNTER — Ambulatory Visit: Payer: Medicare Other | Admitting: Neurology

## 2022-11-01 DIAGNOSIS — R0989 Other specified symptoms and signs involving the circulatory and respiratory systems: Secondary | ICD-10-CM | POA: Diagnosis not present

## 2022-11-01 DIAGNOSIS — J9611 Chronic respiratory failure with hypoxia: Secondary | ICD-10-CM | POA: Diagnosis not present

## 2022-11-03 ENCOUNTER — Encounter: Payer: Self-pay | Admitting: Student

## 2022-11-03 ENCOUNTER — Non-Acute Institutional Stay: Payer: Medicare Other | Admitting: Student

## 2022-11-03 DIAGNOSIS — E119 Type 2 diabetes mellitus without complications: Secondary | ICD-10-CM

## 2022-11-03 DIAGNOSIS — Z7189 Other specified counseling: Secondary | ICD-10-CM | POA: Diagnosis not present

## 2022-11-03 DIAGNOSIS — F01A0 Vascular dementia, mild, without behavioral disturbance, psychotic disturbance, mood disturbance, and anxiety: Secondary | ICD-10-CM | POA: Diagnosis not present

## 2022-11-03 DIAGNOSIS — I10 Essential (primary) hypertension: Secondary | ICD-10-CM | POA: Diagnosis not present

## 2022-11-03 DIAGNOSIS — R3981 Functional urinary incontinence: Secondary | ICD-10-CM

## 2022-11-03 DIAGNOSIS — Z9981 Dependence on supplemental oxygen: Secondary | ICD-10-CM

## 2022-11-03 DIAGNOSIS — R159 Full incontinence of feces: Secondary | ICD-10-CM

## 2022-11-03 DIAGNOSIS — U071 COVID-19: Secondary | ICD-10-CM | POA: Diagnosis not present

## 2022-11-03 DIAGNOSIS — L89151 Pressure ulcer of sacral region, stage 1: Secondary | ICD-10-CM | POA: Diagnosis not present

## 2022-11-03 NOTE — Progress Notes (Signed)
Location:  Other [facility]  Nursing Home Room Number: [place] Place of Service:  ALF [phone]) Provider:  Dr. Amada Kingfisher, MD  Patient Care Team: Dewayne Shorter, MD as PCP - General (Family Medicine) Berle Mull, MD as Consulting Physician (Sports Medicine) Armbruster, Carlota Raspberry, MD as Consulting Physician (Gastroenterology) Festus Aloe, MD as Consulting Physician (Urology) Cira Rue, RN Nurse Navigator as Registered Nurse (Medical Oncology) [name], Nanine Means, MD (Neurology) Consuella Lose, MD as Consulting Physician (Neurosurgery)  Extended Emergency Contact Information Primary Emergency Contact: Midge Minium Address: [address]          [place], [place] Phone: [phone] Mobile Phone: [phone] Relation: Spouse  Code Status:  DNR Goals of care: Advanced Directive information    11/03/2022    8:18 AM  Advanced Directives  Does Patient Have a Medical Advance Directive? Yes  Type of Paramedic of [place];Out of facility DNR (pink MOST or yellow form)  Does patient want to make changes to medical advance directive? No - Patient declined  Copy of [place] in Chart? Yes - validated most recent copy scanned in chart (See row information)     Chief Complaint  Patient presents with   Medical Management of Chronic Issues    Medical Management of Chronic Issues.     HPI:  Pt is a 76 y.o. male seen today for medical management of chronic diseases.  Patient seen today for evaluation of sacral injury.  Patient notably diagnosed with COVID 7 days ago.  Patient has been on 2 L nasal cannula and has not received treatment at this time.  Patient is sleepy  Advance Care Planning  Call patient's wife to discuss goals of care and determine parameters for in creasing level of care or transfer to the hospital.  Discussed concern that patient is not on  oxygen and is not improving, asked whether or not she would be interested in treating his underlying COVID would like to for the infection to run its course.  Discussed with patient's wife concerned that COVID with an oxygen requirement usually requires an admission and treatment with IV infusion of remdesivir.  Wife states that since he has been stable for the last few days would prefer not to transfer him to the hospital.  Asked for clarification about whether or not there would be a time in which she would want to transfer him to the hospital.  She states that she would only want him to go to the hospital if his oxygen requirement increased or if he was no longer eating and drinking.  Per nursing patient with continued to tolerate fluids well.  Will plan to continue treatment for patient in the memory care unit at this time.  Wife maintains that patient is a DNR status.       Past Medical History:  Diagnosis Date   Allergy    Anemia 1996   due to a bleeding from aspirin   Arthritis    Cataract    History of blood transfusion 1996   HOH (hard of hearing)    uses hearing aids/ hearing on left side worse   Hyperlipidemia    Hypertension    Prediabetes 10/20/2014   Prostate cancer [facility])    Sleep apnea    uses c-pap   Spinal stenosis    Past Surgical History:  Procedure Laterality Date   COLONOSCOPY  07/30/2019   COLONOSCOPY WITH  PROPOFOL N/A 09/16/2018   Procedure: COLONOSCOPY WITH PROPOFOL;  Surgeon: Yetta Flock, MD;  Location: [facility];  Service: Gastroenterology;  Laterality: N/A;   POLYPECTOMY  09/16/2018   Procedure: POLYPECTOMY;  Surgeon: Yetta Flock, MD;  Location: [facility];  Service: Gastroenterology;;   POLYPECTOMY     SUBMUCOSAL INJECTION  09/16/2018   Procedure: SUBMUCOSAL INJECTION;  Surgeon: Yetta Flock, MD;  Location: [facility];  Service: Gastroenterology;;   TONSILLECTOMY     VASECTOMY      No Known Allergies  Outpatient  Encounter Medications as of 11/03/2022  Medication Sig   acetaminophen (TYLENOL) 325 MG tablet Take 650 mg by mouth every 4 (four) hours as needed.   acetaminophen (TYLENOL) 650 MG suppository Place 650 mg rectally every 4 (four) hours as needed.   atorvastatin (LIPITOR) 20 MG tablet TAKE 1 TABLET BY MOUTH ONCE  DAILY   Calcium 200 MG TABS Give one by mouth once daily.   Cholecalciferol (VITAMIN D3) 50 MCG (2000 UT) CAPS Take by mouth.   donepezil (ARICEPT) 10 MG tablet Take 1 tablet (10 mg total) by mouth at bedtime.   finasteride (PROSCAR) 5 MG tablet Take 5 mg by mouth daily.   fluticasone (VERAMYST) 27.5 MCG/SPRAY nasal spray Place 1 spray into the nose daily.   folic acid (FOLVITE) 347 MCG tablet Take 800 mcg by mouth daily.   furosemide (LASIX) 20 MG tablet Take 20 mg by mouth daily as needed.   losartan (COZAAR) 50 MG tablet TAKE 1 TABLET BY MOUTH  DAILY   memantine (NAMENDA) 10 MG tablet Take 1 tablet (10 mg total) by mouth 2 (two) times daily.   Multiple Vitamin (MULTIVITAMIN WITH MINERALS) TABS tablet Take 1 tablet by mouth daily. Centrum   Probiotic Product (PROBIOTIC PO) Take 1 Dose by mouth daily.   SUPER B COMPLEX & C TABS Give one tablet by mouth one time daily   tamsulosin (FLOMAX) 0.4 MG CAPS capsule Take 0.4 mg by mouth daily after supper.   No facility-administered encounter medications on file as of 11/03/2022.    Review of Systems  Unable to perform ROS: Dementia  All other systems reviewed and are negative.   Immunization History  Administered Date(s) Administered   Influenza Split 09/22/2011, 08/25/2014   Influenza Whole 08/23/2007, 07/09/2009, 07/25/2010   Influenza, High Dose Seasonal PF 08/03/2017, 08/05/2018, 08/06/2019, 08/06/2019, 08/05/2020   Influenza-Unspecified 08/17/2015, 09/06/2021   PFIZER(Purple Top)SARS-COV-2 Vaccination 11/25/2019, 12/16/2019, 08/23/2020, 12/15/2020   PNEUMOCOCCAL CONJUGATE-20 02/15/2022   Pneumococcal Conjugate-13 10/20/2014    Pneumococcal Polysaccharide-23 07/03/2008, 08/03/2017   Td 07/23/2009   Tdap 11/03/2019   Zoster Recombinat (Shingrix) 07/07/2020, 09/23/2020   Zoster, Live 03/25/2011   Pertinent  Health Maintenance Due  Topic Date Due   FOOT EXAM  Never done   OPHTHALMOLOGY EXAM  Never done   HEMOGLOBIN A1C  08/17/2022   INFLUENZA VACCINE  02/04/2023 (Originally 06/06/2022)   COLONOSCOPY (Pts 45-27yr Insurance coverage will need to be confirmed)  06/23/2023      10/06/2020    1:08 PM 04/06/2021   10:36 AM 10/06/2021   11:00 AM 12/19/2021    3:11 PM 02/15/2022   10:16 AM  Fall Risk  Falls in the past year? 0 1 0 0 1  Was there an injury with Fall? 0 0 0 0 0  Fall Risk Category Calculator 0 2 0 0 1  Fall Risk Category Low Moderate Low Low Low  Patient Fall Risk Level Moderate fall risk Moderate fall  risk Low fall risk Low fall risk Moderate fall risk  Fall risk Follow up  Falls evaluation completed Falls evaluation completed Falls prevention discussed Falls evaluation completed   Functional Status Survey:    Vitals:   11/03/22 0811  BP: 134/87  Pulse: 84  Resp: 14  Temp: 98.4 F (36.9 C)  SpO2: 93%  Weight: 216 lb 14.4 oz (98.4 kg)  Height: '5\' 11"'$  (1.803 m)   Body mass index is 30.25 kg/m. Physical Exam Constitutional:      Comments: Sleeping in bed, arousable and withdraws to pain  Cardiovascular:     Rate and Rhythm: Normal rate and regular rhythm.  Pulmonary:     Effort: Pulmonary effort is normal.     Comments: 2 L nasal cannula Abdominal:     General: Bowel sounds are normal.     Palpations: Abdomen is soft.  Genitourinary:    Comments: Stool present, decreased anal sphincter tone Skin:    General: Skin is warm and dry.     Comments: Sacral wound image below  Neurological:     Mental Status: Mental status is at baseline.     Labs reviewed: Recent Labs    02/15/22 1059  NA 140  K 5.0  CL 101  CO2 32  GLUCOSE 106*  BUN 19  CREATININE 0.96  CALCIUM 9.1    Recent Labs    02/15/22 1059  AST 22  ALT 17  ALKPHOS 69  BILITOT 0.7  PROT 6.8  ALBUMIN 4.3   Recent Labs    02/15/22 1059  WBC 6.9  NEUTROABS 4.7  HGB 13.6  HCT 41.3  MCV 90.9  PLT 239.0   Lab Results  Component Value Date   TSH 6.11 (H) 02/15/2022   Lab Results  Component Value Date   HGBA1C 6.3 02/15/2022   Lab Results  Component Value Date   CHOL 145 02/15/2022   HDL 66.60 02/15/2022   LDLCALC 59 02/15/2022   TRIG 95.0 02/15/2022   CHOLHDL 2 02/15/2022    Significant Diagnostic Results in last 30 days:  No results found.  Assessment/Plan COVID-19 virus infection  Goals of care, counseling/discussion  On supplemental oxygen therapy  Mild vascular dementia without behavioral disturbance, psychotic disturbance, mood disturbance, or anxiety (HCC)  Diabetes mellitus without complication ([place])  Primary hypertension  Full incontinence of feces  Urinary incontinence due to immobility  Pressure injury of sacral region, stage 1 Patient with underlying dementia does continue to progress requiring memory care on locked unit.  Patient has had poor mobility and has been in physical therapy.  Patient has been in the bed and required lift during his time and during acute infection.  Plan to start Paxlovid 300-100 mg twice daily for 5 days to continue supportive care in the facility.  If patient requires more than 2 L nasal cannula for stops eating and drinking, plan will be to transfer him to higher level of care at the hospital.  Patient is a DNR.  Patient will likely need additional physical therapy following this COVID infection.  Will continue to discuss with physical therapy whether or not patient should transition to high-level care with skilled nursing based on his increased dependence in the setting of an acute infection.  Chest x-ray in facility negative for lobar opacities.  Patient with notable sacral wound appears to be stage I.  Will plan to continue to  apply barrier cream at this time and continue to monitor.  Family/ staff Communication: nursing and  wife  Labs/tests ordered:  none   I spent greater than 15 minutes discussing goals of care with patient's wife.  Greater than 30 minutes regarding coordination of care for this patient. I spent 45 minutes total for the care of this patient.   Tomasa Rand, MD, [facility] [phone]

## 2022-11-04 DIAGNOSIS — R159 Full incontinence of feces: Secondary | ICD-10-CM | POA: Insufficient documentation

## 2022-11-04 DIAGNOSIS — Z9981 Dependence on supplemental oxygen: Secondary | ICD-10-CM | POA: Insufficient documentation

## 2022-11-04 DIAGNOSIS — R3981 Functional urinary incontinence: Secondary | ICD-10-CM | POA: Insufficient documentation

## 2022-11-04 DIAGNOSIS — L89151 Pressure ulcer of sacral region, stage 1: Secondary | ICD-10-CM | POA: Insufficient documentation

## 2022-11-04 DIAGNOSIS — U071 COVID-19: Secondary | ICD-10-CM | POA: Insufficient documentation

## 2022-11-08 ENCOUNTER — Other Ambulatory Visit: Payer: Self-pay | Admitting: Neurology

## 2022-11-10 ENCOUNTER — Encounter: Payer: Self-pay | Admitting: Nurse Practitioner

## 2022-11-10 ENCOUNTER — Non-Acute Institutional Stay: Payer: Medicare Other | Admitting: Nurse Practitioner

## 2022-11-10 DIAGNOSIS — Z66 Do not resuscitate: Secondary | ICD-10-CM

## 2022-11-10 DIAGNOSIS — I10 Essential (primary) hypertension: Secondary | ICD-10-CM | POA: Diagnosis not present

## 2022-11-10 DIAGNOSIS — R059 Cough, unspecified: Secondary | ICD-10-CM | POA: Diagnosis not present

## 2022-11-10 DIAGNOSIS — F01A0 Vascular dementia, mild, without behavioral disturbance, psychotic disturbance, mood disturbance, and anxiety: Secondary | ICD-10-CM | POA: Diagnosis not present

## 2022-11-10 DIAGNOSIS — U071 COVID-19: Secondary | ICD-10-CM | POA: Diagnosis not present

## 2022-11-10 NOTE — Progress Notes (Unsigned)
Location:  Other [facility]) Nursing Home Room Number: [place] of Service:  ALF (13)  Aleeyah Bensen K. Dewaine Oats, NP   Patient Care Team: Dewayne Shorter, MD as PCP - General (Family Medicine) Berle Mull, MD as Consulting Physician (Sports Medicine) Armbruster, Carlota Raspberry, MD as Consulting Physician (Gastroenterology) Festus Aloe, MD as Consulting Physician (Urology) Cira Rue, RN Nurse Navigator as Registered Nurse (Medical Oncology) [name], Nanine Means, MD (Neurology) Consuella Lose, MD as Consulting Physician (Neurosurgery)  Extended Emergency Contact Information Primary Emergency Contact: Midge Minium Address: [address]          [place], [place] of [place] Phone: [phone] Mobile Phone: [phone] Relation: Spouse  Goals of care: Advanced Directive information    11/10/2022    9:29 AM  Advanced Directives  Does Patient Have a Medical Advance Directive? Yes  Type of Paramedic of [place];Living will;Out of facility DNR (pink MOST or yellow form)  Does patient want to make changes to medical advance directive? No - Patient declined  Copy of [place] in Chart? Yes - validated most recent copy scanned in chart (See row information)  Pre-existing out of facility DNR order (yellow form or pink MOST form) Yellow form placed in chart (order not valid for inpatient use)     Chief Complaint  Patient presents with   Acute Visit    Covid positive. Patient with productive cough     HPI:  Pt is a 77 y.o. male seen today for an acute visit for ongoing cough and congestion with ongoing positive COVID test.  Pt tested positive for COVID on 12/23, he has had ongoing fatigue, cough and congestion. Wife is concerned that he is not improving. Does not appear to have shortness of breath. Pt continues to eat well with wife's assistance.  No fever or chills. Previously was requiring O2 but this has been  weaned off. O2 today 90%   Past Medical History:  Diagnosis Date   Allergy    Anemia 1996   due to a bleeding from aspirin   Arthritis    Cataract    History of blood transfusion 1996   HOH (hard of hearing)    uses hearing aids/ hearing on left side worse   Hyperlipidemia    Hypertension    Prediabetes 10/20/2014   Prostate cancer [facility])    Sleep apnea    uses c-pap   Spinal stenosis    Past Surgical History:  Procedure Laterality Date   COLONOSCOPY  07/30/2019   COLONOSCOPY WITH PROPOFOL N/A 09/16/2018   Procedure: COLONOSCOPY WITH PROPOFOL;  Surgeon: Yetta Flock, MD;  [facility];  Service: Gastroenterology;  Laterality: N/A;   POLYPECTOMY  09/16/2018   Procedure: POLYPECTOMY;  Surgeon: Yetta Flock, MD;  [facility];  Service: Gastroenterology;;   POLYPECTOMY     SUBMUCOSAL INJECTION  09/16/2018   Procedure: SUBMUCOSAL INJECTION;  Surgeon: Yetta Flock, MD;  [facility];  Service: Gastroenterology;;   TONSILLECTOMY     VASECTOMY      No Known Allergies  Outpatient Encounter Medications as of 11/10/2022  Medication Sig   acetaminophen (TYLENOL) 325 MG tablet Take 650 mg by mouth every 4 (four) hours as needed.   acetaminophen (TYLENOL) 650 MG suppository Place 650 mg rectally every 4 (four) hours as needed.   atorvastatin (LIPITOR) 20 MG tablet TAKE 1 TABLET BY MOUTH ONCE  DAILY   Calcium 200 MG TABS Give  one by mouth once daily.   Cholecalciferol (VITAMIN D3) 50 MCG (2000 UT) CAPS Take by mouth.   donepezil (ARICEPT) 10 MG tablet Take 1 tablet (10 mg total) by mouth at bedtime.   finasteride (PROSCAR) 5 MG tablet Take 5 mg by mouth daily.   fluticasone (VERAMYST) 27.5 MCG/SPRAY nasal spray Place 1 spray into the nose daily.   folic acid (FOLVITE) 856 MCG tablet Take 800 mcg by mouth daily.   furosemide (LASIX) 20 MG tablet Take 20 mg by mouth daily as needed.   losartan (COZAAR) 50 MG tablet TAKE 1 TABLET BY  MOUTH  DAILY   memantine (NAMENDA) 10 MG tablet TAKE 1 TABLET BY MOUTH TWICE  DAILY   Multiple Vitamin (MULTIVITAMIN WITH MINERALS) TABS tablet Take 1 tablet by mouth daily. Centrum   Probiotic Product (PROBIOTIC PO) Take 1 Dose by mouth daily.   SUPER B COMPLEX & C TABS Give one tablet by mouth one time daily   tamsulosin (FLOMAX) 0.4 MG CAPS capsule Take 0.4 mg by mouth daily after supper.   No facility-administered encounter medications on file as of 11/10/2022.    Review of Systems  Unable to perform ROS: Dementia  ***  Immunization History  Administered Date(s) Administered   Influenza Split 09/22/2011, 08/25/2014   Influenza Whole 08/23/2007, 07/09/2009, 07/25/2010   Influenza, High Dose Seasonal PF 08/03/2017, 08/05/2018, 08/06/2019, 08/06/2019, 08/05/2020   Influenza-Unspecified 08/17/2015, 09/06/2021   PFIZER(Purple Top)SARS-COV-2 Vaccination 11/25/2019, 12/16/2019, 08/23/2020, 12/15/2020   PNEUMOCOCCAL CONJUGATE-20 02/15/2022   Pneumococcal Conjugate-13 10/20/2014   Pneumococcal Polysaccharide-23 07/03/2008, 08/03/2017   Td 07/23/2009   Tdap 11/03/2019   Zoster Recombinat (Shingrix) 07/07/2020, 09/23/2020   Zoster, Live 03/25/2011   Pertinent  Health Maintenance Due  Topic Date Due   FOOT EXAM  Never done   OPHTHALMOLOGY EXAM  Never done   HEMOGLOBIN A1C  08/17/2022   INFLUENZA VACCINE  02/04/2023 (Originally 06/06/2022)   COLONOSCOPY (Pts 45-30yr Insurance coverage will need to be confirmed)  06/23/2023      10/06/2020    1:08 PM 04/06/2021   10:36 AM 10/06/2021   11:00 AM 12/19/2021    3:11 PM 02/15/2022   10:16 AM  Fall Risk  Falls in the past year? 0 1 0 0 1  Was there an injury with Fall? 0 0 0 0 0  Fall Risk Category Calculator 0 2 0 0 1  Fall Risk Category Low Moderate Low Low Low  Patient Fall Risk Level Moderate fall risk Moderate fall risk Low fall risk Low fall risk Moderate fall risk  Fall risk Follow up  Falls evaluation completed Falls evaluation  completed Falls prevention discussed Falls evaluation completed   Functional Status Survey:    Vitals:   11/10/22 0925  BP: 118/62  Pulse: 88  Weight: 216 lb (98 kg)  Height: '5\' 11"'$  (1.803 m)   Body mass index is 30.13 kg/m. Physical Exam Constitutional:      General: He is not in acute distress.    Appearance: Normal appearance. He is well-developed. He is not diaphoretic.     Comments: Pt is somnolent  but in no acute distress, breathing easily, color normal  HENT:     Head: Normocephalic and atraumatic.     Right Ear: External ear normal.     Left Ear: External ear normal.     Mouth/Throat:     Pharynx: No oropharyngeal exudate.  Eyes:     Conjunctiva/sclera: Conjunctivae normal.     Pupils: Pupils are  equal, round, and reactive to light.  Cardiovascular:     Rate and Rhythm: Normal rate and regular rhythm.     Heart sounds: Normal heart sounds.  Pulmonary:     Effort: Pulmonary effort is normal.     Breath sounds: Normal breath sounds.  Abdominal:     General: Bowel sounds are normal.     Palpations: Abdomen is soft.  Musculoskeletal:        General: No tenderness.     Cervical back: Normal range of motion and neck supple.     Right lower leg: No edema.     Left lower leg: No edema.  Skin:    General: Skin is warm and dry.  Neurological:     Mental Status: He is oriented to person, place, and time. He is lethargic.     Labs reviewed: Recent Labs    02/15/22 1059 10/02/22 0000  NA 140 142  K 5.0 4.5  CL 101 104  CO2 32 31*  GLUCOSE 106*  --   BUN 19 25*  CREATININE 0.96 1.1  CALCIUM 9.1 8.8   Recent Labs    02/15/22 1059 10/02/22 0000  AST 22 21  ALT 17 18  ALKPHOS 69 70  BILITOT 0.7  --   PROT 6.8  --   ALBUMIN 4.3 4.0   Recent Labs    02/15/22 1059 10/02/22 0000  WBC 6.9 8.1  NEUTROABS 4.7 5,686.00  HGB 13.6 13.7  HCT 41.3 41  MCV 90.9  --   PLT 239.0 253   Lab Results  Component Value Date   TSH 6.11 (H) 02/15/2022   Lab  Results  Component Value Date   HGBA1C 6.3 02/15/2022   Lab Results  Component Value Date   CHOL 145 02/15/2022   HDL 66.60 02/15/2022   LDLCALC 59 02/15/2022   TRIG 95.0 02/15/2022   CHOLHDL 2 02/15/2022    Significant Diagnostic Results in last 30 days:  No results found.  Assessment/Plan 1. COVID-19 virus infection -ongoing positive COVID, cough and congestion -will get chest xray to rule out pneumonia, cbc with diff and BMP -mucinex  DM by mouth BID with full glass of water -continue supportive care   2. Mild vascular dementia without behavioral disturbance, psychotic disturbance, mood disturbance, or anxiety (HCC) ***  3. DNR (do not resuscitate) *** - Do not attempt resuscitation (DNR)    Carlos American. [place], [facility] [phone]

## 2022-11-13 ENCOUNTER — Non-Acute Institutional Stay: Payer: Medicare Other | Admitting: Student

## 2022-11-13 ENCOUNTER — Encounter: Payer: Self-pay | Admitting: Student

## 2022-11-13 DIAGNOSIS — L89153 Pressure ulcer of sacral region, stage 3: Secondary | ICD-10-CM | POA: Diagnosis not present

## 2022-11-13 DIAGNOSIS — L03317 Cellulitis of buttock: Secondary | ICD-10-CM | POA: Diagnosis not present

## 2022-11-13 DIAGNOSIS — L89322 Pressure ulcer of left buttock, stage 2: Secondary | ICD-10-CM | POA: Insufficient documentation

## 2022-11-13 NOTE — Progress Notes (Signed)
Location:  Other [place] Room Number: [place] of Service:  ALF [phone]) Provider:  Dr. Amada Kingfisher, MD  Patient Care Team: Dewayne Shorter, MD as PCP - General (Family Medicine) Berle Mull, MD as Consulting Physician (Sports Medicine) Armbruster, Carlota Raspberry, MD as Consulting Physician (Gastroenterology) Festus Aloe, MD as Consulting Physician (Urology) Cira Rue, RN Nurse Navigator as Registered Nurse (Medical Oncology) [name], Nanine Means, MD (Neurology) Consuella Lose, MD as Consulting Physician (Neurosurgery)  Extended Emergency Contact Information Primary Emergency Contact: Midge Minium Address: [address]          [place], [place] Phone: [phone] Mobile Phone: [phone] Relation: Spouse  Code Status:  DNR Goals of care: Advanced Directive information    11/13/2022    8:17 AM  Advanced Directives  Does Patient Have a Medical Advance Directive? Yes  Type of Paramedic of [place];Out of facility DNR (pink MOST or yellow form)  Does patient want to make changes to medical advance directive? No - Patient declined  Copy of [place] in Chart? Yes - validated most recent copy scanned in chart (See row information)     Chief Complaint  Patient presents with   Acute Visit    Sacral Wound    HPI:  Pt is a 77 y.o. male seen today for an acute visit for sacral wound. Patient was noted to have a sacral wound on 12/29. Since that time, nursing has applied barrier cream. He has had progression/poor healing area. Patient is doing well. Resting comfortably in bed. He has been quarantined for COVID for 14 days. He as finally tested negative today.   Nursing states that have used hydrogel on the wound so far.   Spoke with patient's wife regarding concern for patient's status in AL. Discussed my desire for his transition to higher  level of care based on the wound present and his increased weakness during recent illness. She has concern that he won't return to [facility], and discussed that is based on Physical Therapy eval and treatment. Stated she needs to discuss further with Raquel Sarna (SW) and Amy (DON).    Past Medical History:  Diagnosis Date   Allergy    Anemia 1996   due to a bleeding from aspirin   Arthritis    Cataract    History of blood transfusion 1996   HOH (hard of hearing)    uses hearing aids/ hearing on left side worse   Hyperlipidemia    Hypertension    Prediabetes 10/20/2014   Prostate cancer [facility])    Sleep apnea    uses c-pap   Spinal stenosis    Past Surgical History:  Procedure Laterality Date   COLONOSCOPY  07/30/2019   COLONOSCOPY WITH PROPOFOL N/A 09/16/2018   Procedure: COLONOSCOPY WITH PROPOFOL;  Surgeon: Yetta Flock, MD;  Location: [facility];  Service: Gastroenterology;  Laterality: N/A;   POLYPECTOMY  09/16/2018   Procedure: POLYPECTOMY;  Surgeon: Yetta Flock, MD;  Location: [facility];  Service: Gastroenterology;;   POLYPECTOMY     SUBMUCOSAL INJECTION  09/16/2018   Procedure: SUBMUCOSAL INJECTION;  Surgeon: Yetta Flock, MD;  Location: [facility];  Service: Gastroenterology;;   TONSILLECTOMY     VASECTOMY      No Known Allergies  Outpatient Encounter Medications as of 11/13/2022  Medication Sig   acetaminophen (TYLENOL) 325 MG tablet Take 650 mg by mouth every 4 (  four) hours as needed.   acetaminophen (TYLENOL) 650 MG suppository Place 650 mg rectally every 4 (four) hours as needed.   atorvastatin (LIPITOR) 20 MG tablet TAKE 1 TABLET BY MOUTH ONCE  DAILY   Calcium 200 MG TABS Give one by mouth once daily.   Cholecalciferol (VITAMIN D3) 50 MCG (2000 UT) CAPS Take 1 capsule by mouth daily.   dextromethorphan-guaiFENesin (MUCINEX DM) 30-600 MG 12hr tablet Take 1 tablet by mouth. Every 12 hours for cough for 7 days.   donepezil (ARICEPT) 10  MG tablet Take 1 tablet (10 mg total) by mouth at bedtime.   finasteride (PROSCAR) 5 MG tablet Take 5 mg by mouth daily.   fluticasone (FLONASE) 50 MCG/ACT nasal spray Place 1 spray into both nostrils daily.   folic acid (FOLVITE) 741 MCG tablet Take 800 mcg by mouth daily.   furosemide (LASIX) 20 MG tablet Take 20 mg by mouth daily as needed.   losartan (COZAAR) 50 MG tablet TAKE 1 TABLET BY MOUTH  DAILY   memantine (NAMENDA) 10 MG tablet TAKE 1 TABLET BY MOUTH TWICE  DAILY   Multiple Vitamin (MULTIVITAMIN WITH MINERALS) TABS tablet Take 1 tablet by mouth daily. Centrum   Probiotic Product (PROBIOTIC PO) Take 1 Dose by mouth daily.   SUPER B COMPLEX & C TABS Give one tablet by mouth one time daily   tamsulosin (FLOMAX) 0.4 MG CAPS capsule Take 0.4 mg by mouth 2 (two) times daily.   [DISCONTINUED] fluticasone (VERAMYST) 27.5 MCG/SPRAY nasal spray Place 1 spray into the nose daily.   [DISCONTINUED] tamsulosin (FLOMAX) 0.4 MG CAPS capsule Take 0.4 mg by mouth daily after supper.   No facility-administered encounter medications on file as of 11/13/2022.    Review of Systems  Immunization History  Administered Date(s) Administered   Influenza Split 09/22/2011, 08/25/2014   Influenza Whole 08/23/2007, 07/09/2009, 07/25/2010   Influenza, High Dose Seasonal PF 08/03/2017, 08/05/2018, 08/06/2019, 08/06/2019, 08/05/2020   Influenza-Unspecified 08/17/2015, 09/06/2021   PFIZER(Purple Top)SARS-COV-2 Vaccination 11/25/2019, 12/16/2019, 08/23/2020, 12/15/2020   PNEUMOCOCCAL CONJUGATE-20 02/15/2022   Pneumococcal Conjugate-13 10/20/2014   Pneumococcal Polysaccharide-23 07/03/2008, 08/03/2017   Td 07/23/2009   Tdap 11/03/2019   Zoster Recombinat (Shingrix) 07/07/2020, 09/23/2020   Zoster, Live 03/25/2011   Pertinent  Health Maintenance Due  Topic Date Due   FOOT EXAM  Never done   OPHTHALMOLOGY EXAM  Never done   HEMOGLOBIN A1C  08/17/2022   INFLUENZA VACCINE  02/04/2023 (Originally 06/06/2022)    COLONOSCOPY (Pts 45-51yr Insurance coverage will need to be confirmed)  06/23/2023      10/06/2020    1:08 PM 04/06/2021   10:36 AM 10/06/2021   11:00 AM 12/19/2021    3:11 PM 02/15/2022   10:16 AM  Fall Risk  Falls in the past year? 0 1 0 0 1  Was there an injury with Fall? 0 0 0 0 0  Fall Risk Category Calculator 0 2 0 0 1  Fall Risk Category Low Moderate Low Low Low  Patient Fall Risk Level Moderate fall risk Moderate fall risk Low fall risk Low fall risk Moderate fall risk  Fall risk Follow up  Falls evaluation completed Falls evaluation completed Falls prevention discussed Falls evaluation completed   Functional Status Survey:    Vitals:   11/13/22 0805  BP: 132/62  Pulse: 93  Resp: 20  Temp: 98.4 F (36.9 C)  SpO2: 93%  Weight: 216 lb 14.4 oz (98.4 kg)  Height: '5\' 11"'$  (1.803 m)   Body  mass index is 30.25 kg/m. Physical Exam Neurological:     Mental Status: He is alert.     Labs reviewed: Recent Labs    02/15/22 1059 10/02/22 0000  NA 140 142  K 5.0 4.5  CL 101 104  CO2 32 31*  GLUCOSE 106*  --   BUN 19 25*  CREATININE 0.96 1.1  CALCIUM 9.1 8.8   Recent Labs    02/15/22 1059 10/02/22 0000  AST 22 21  ALT 17 18  ALKPHOS 69 70  BILITOT 0.7  --   PROT 6.8  --   ALBUMIN 4.3 4.0   Recent Labs    02/15/22 1059 10/02/22 0000  WBC 6.9 8.1  NEUTROABS 4.7 5,686.00  HGB 13.6 13.7  HCT 41.3 41  MCV 90.9  --   PLT 239.0 253   Lab Results  Component Value Date   TSH 6.11 (H) 02/15/2022   Lab Results  Component Value Date   HGBA1C 6.3 02/15/2022   Lab Results  Component Value Date   CHOL 145 02/15/2022   HDL 66.60 02/15/2022   LDLCALC 59 02/15/2022   TRIG 95.0 02/15/2022   CHOLHDL 2 02/15/2022    Significant Diagnostic Results in last 30 days:  No results found.  Assessment/Plan 1. Pressure injury of left buttock, stage 2 ([place]) Patient with non-healing ulceration of the sacrum. Plan to start santyl given level of exudate. At this time,  no indication for further imaging. Probes to 0.5 cm without tracking. Will request for patient to transfer to higher level of care for further evaluation and treatment with wound care team.   2. Sacral decubitus ulcer, stage III ([place]) Left buttock. Stable. Will start Calcium alginate to the area.   3. Cellulitis of buttock Induration, but no palpable abscess. Blanchable skin is not consistent with pressure wound. Given location, concern for polymycrobial infection. Will start Amoxicillin-clavulanate (875/125 mg every 12 hours) and Doxycycline 100 mg BID for 5 days.   Family/ staff Communication: Spouse and nursing - I spent 15 minutes discussing care plan with patient's wife and 15 minutes face to face time with the patient.   Labs/tests ordered:  None

## 2022-11-16 DIAGNOSIS — Z9181 History of falling: Secondary | ICD-10-CM | POA: Diagnosis not present

## 2022-11-16 DIAGNOSIS — M6281 Muscle weakness (generalized): Secondary | ICD-10-CM | POA: Diagnosis not present

## 2022-11-17 ENCOUNTER — Encounter: Payer: Self-pay | Admitting: Student

## 2022-11-17 ENCOUNTER — Non-Acute Institutional Stay (SKILLED_NURSING_FACILITY): Payer: Medicare Other | Admitting: Student

## 2022-11-17 DIAGNOSIS — L89153 Pressure ulcer of sacral region, stage 3: Secondary | ICD-10-CM | POA: Diagnosis not present

## 2022-11-17 DIAGNOSIS — M6281 Muscle weakness (generalized): Secondary | ICD-10-CM | POA: Diagnosis not present

## 2022-11-17 DIAGNOSIS — Z9181 History of falling: Secondary | ICD-10-CM | POA: Diagnosis not present

## 2022-11-17 DIAGNOSIS — L89322 Pressure ulcer of left buttock, stage 2: Secondary | ICD-10-CM

## 2022-11-17 NOTE — Progress Notes (Unsigned)
Location:  [facility]  Nursing Home Room Number: [place] Place of Service:  ALF [phone]) Provider:  Dr. Amada Kingfisher, MD  Patient Care Team: Dewayne Shorter, MD as PCP - General (Family Medicine) Berle Mull, MD as Consulting Physician (Sports Medicine) Armbruster, Carlota Raspberry, MD as Consulting Physician (Gastroenterology) Festus Aloe, MD as Consulting Physician (Urology) Cira Rue, RN Nurse Navigator as Registered Nurse (Medical Oncology) [name], Nanine Means, MD (Neurology) Consuella Lose, MD as Consulting Physician (Neurosurgery)  Extended Emergency Contact Information Primary Emergency Contact: Midge Minium Address: [address]          [place], [place] Phone: [phone] Mobile Phone: [phone] Relation: Spouse  Code Status:  DNR Goals of care: Advanced Directive information    11/17/2022    9:12 AM  Advanced Directives  Does Patient Have a Medical Advance Directive? Yes  Type of Paramedic of [place];Out of facility DNR (pink MOST or yellow form)  Does patient want to make changes to medical advance directive? No - Patient declined  Copy of [place] in Chart? Yes - validated most recent copy scanned in chart (See row information)     Chief Complaint  Patient presents with   Acute Visit    Wound    HPI:  Pt is a 77 y.o. male seen today for an acute visit for follow up of sacral wound. Patient is alert and in no distress. He says "hi."    Past Medical History:  Diagnosis Date   Allergy    Anemia 1996   due to a bleeding from aspirin   Arthritis    Cataract    History of blood transfusion 1996   HOH (hard of hearing)    uses hearing aids/ hearing on left side worse   Hyperlipidemia    Hypertension    Prediabetes 10/20/2014   Prostate cancer ([place])    Sleep apnea    uses c-pap   Spinal stenosis    Past Surgical History:   Procedure Laterality Date   COLONOSCOPY  07/30/2019   COLONOSCOPY WITH PROPOFOL N/A 09/16/2018   Procedure: COLONOSCOPY WITH PROPOFOL;  Surgeon: Yetta Flock, MD;  [facility];  Service: Gastroenterology;  Laterality: N/A;   POLYPECTOMY  09/16/2018   Procedure: POLYPECTOMY;  Surgeon: Yetta Flock, MD;  [facility];  Service: Gastroenterology;;   POLYPECTOMY     SUBMUCOSAL INJECTION  09/16/2018   Procedure: SUBMUCOSAL INJECTION;  Surgeon: Yetta Flock, MD;  [facility];  Service: Gastroenterology;;   TONSILLECTOMY     VASECTOMY      No Known Allergies  Outpatient Encounter Medications as of 11/17/2022  Medication Sig   acetaminophen (TYLENOL) 325 MG tablet Take 650 mg by mouth every 4 (four) hours as needed.   acetaminophen (TYLENOL) 650 MG suppository Place 650 mg rectally every 4 (four) hours as needed.   amoxicillin-clavulanate (AUGMENTIN) 875-125 MG tablet Take 1 tablet by mouth 2 (two) times daily.   atorvastatin (LIPITOR) 20 MG tablet TAKE 1 TABLET BY MOUTH ONCE  DAILY   Calcium 200 MG TABS Give one by mouth once daily.   Cholecalciferol (VITAMIN D3) 50 MCG (2000 UT) CAPS Take 1 capsule by mouth daily.   collagenase (SANTYL) 250 UNIT/GM ointment Apply to sacral wound topically one time daily   dextromethorphan-guaiFENesin (MUCINEX DM) 30-600 MG 12hr tablet Take 1 tablet by mouth. Every 12 hours for cough for 7 days.  donepezil (ARICEPT) 10 MG tablet Take 1 tablet (10 mg total) by mouth at bedtime.   Doxycycline Hyclate 50 MG TABS Take 100 mg by mouth 2 (two) times daily.   finasteride (PROSCAR) 5 MG tablet Take 5 mg by mouth daily.   fluticasone (FLONASE) 50 MCG/ACT nasal spray Place 1 spray into both nostrils daily.   folic acid (FOLVITE) 416 MCG tablet Take 800 mcg by mouth daily.   furosemide (LASIX) 20 MG tablet Take 20 mg by mouth daily as needed.   losartan (COZAAR) 50 MG tablet TAKE 1 TABLET BY MOUTH  DAILY    memantine (NAMENDA) 10 MG tablet TAKE 1 TABLET BY MOUTH TWICE  DAILY   Multiple Vitamin (MULTIVITAMIN WITH MINERALS) TABS tablet Take 1 tablet by mouth daily. Centrum   Probiotic Product (PROBIOTIC PO) Take 1 Dose by mouth daily.   SUPER B COMPLEX & C TABS Give one tablet by mouth one time daily   tamsulosin (FLOMAX) 0.4 MG CAPS capsule Take 0.4 mg by mouth 2 (two) times daily.   No facility-administered encounter medications on file as of 11/17/2022.    Review of Systems  All other systems reviewed and are negative.   Immunization History  Administered Date(s) Administered   Influenza Split 09/22/2011, 08/25/2014   Influenza Whole 08/23/2007, 07/09/2009, 07/25/2010   Influenza, High Dose Seasonal PF 08/03/2017, 08/05/2018, 08/06/2019, 08/06/2019, 08/05/2020   Influenza-Unspecified 08/17/2015, 09/06/2021   PFIZER(Purple Top)SARS-COV-2 Vaccination 11/25/2019, 12/16/2019, 08/23/2020, 12/15/2020   PNEUMOCOCCAL CONJUGATE-20 02/15/2022   Pneumococcal Conjugate-13 10/20/2014   Pneumococcal Polysaccharide-23 07/03/2008, 08/03/2017   Td 07/23/2009   Tdap 11/03/2019   Zoster Recombinat (Shingrix) 07/07/2020, 09/23/2020   Zoster, Live 03/25/2011   Pertinent  Health Maintenance Due  Topic Date Due   FOOT EXAM  Never done   OPHTHALMOLOGY EXAM  Never done   HEMOGLOBIN A1C  08/17/2022   INFLUENZA VACCINE  02/04/2023 (Originally 06/06/2022)   COLONOSCOPY (Pts 45-51yr Insurance coverage will need to be confirmed)  06/23/2023      10/06/2020    1:08 PM 04/06/2021   10:36 AM 10/06/2021   11:00 AM 12/19/2021    3:11 PM 02/15/2022   10:16 AM  Fall Risk  Falls in the past year? 0 1 0 0 1  Was there an injury with Fall? 0 0 0 0 0  Fall Risk Category Calculator 0 2 0 0 1  Fall Risk Category Low Moderate Low Low Low  Patient Fall Risk Level Moderate fall risk Moderate fall risk Low fall risk Low fall risk Moderate fall risk  Fall risk Follow up  Falls evaluation completed Falls evaluation completed  Falls prevention discussed Falls evaluation completed   Functional Status Survey:    Vitals:   11/17/22 0904  BP: 122/65  Pulse: 86  Resp: (!) 22  Temp: 98 F (36.7 C)  SpO2: 96%  Weight: 216 lb 14.4 oz (98.4 kg)  Height: '5\' 11"'$  (1.803 m)   Body mass index is 30.25 kg/m. Physical Exam Skin:    Comments: Image below   Neurological:     Mental Status: He is alert.    11/17/2022 (above) 11/13/2022 (below)    Labs reviewed: Recent Labs    02/15/22 1059 10/02/22 0000  NA 140 142  K 5.0 4.5  CL 101 104  CO2 32 31*  GLUCOSE 106*  --   BUN 19 25*  CREATININE 0.96 1.1  CALCIUM 9.1 8.8   Recent Labs    02/15/22 1059 10/02/22 0000  AST 22  21  ALT 17 18  ALKPHOS 69 70  BILITOT 0.7  --   PROT 6.8  --   ALBUMIN 4.3 4.0   Recent Labs    02/15/22 1059 10/02/22 0000  WBC 6.9 8.1  NEUTROABS 4.7 5,686.00  HGB 13.6 13.7  HCT 41.3 41  MCV 90.9  --   PLT 239.0 253   Lab Results  Component Value Date   TSH 6.11 (H) 02/15/2022   Lab Results  Component Value Date   HGBA1C 6.3 02/15/2022   Lab Results  Component Value Date   CHOL 145 02/15/2022   HDL 66.60 02/15/2022   LDLCALC 59 02/15/2022   TRIG 95.0 02/15/2022   CHOLHDL 2 02/15/2022    Significant Diagnostic Results in last 30 days:  No results found.  Assessment/Plan Sacral decubitus ulcer, stage III (HCC)  Pressure injury of left buttock, stage 2 (HCC) Patient's sacral wound is less erythema today. Continue antibiotics until completion of 7 day course. Continue Santyl (stage 3), calcium alginate (stage 2), and q2 hours turning.   Family/ staff Communication: Nursing  Labs/tests ordered:  none

## 2022-11-20 ENCOUNTER — Non-Acute Institutional Stay: Payer: Medicare Other | Admitting: Student

## 2022-11-20 ENCOUNTER — Encounter: Payer: Self-pay | Admitting: Student

## 2022-11-20 DIAGNOSIS — F02C0 Dementia in other diseases classified elsewhere, severe, without behavioral disturbance, psychotic disturbance, mood disturbance, and anxiety: Secondary | ICD-10-CM

## 2022-11-20 DIAGNOSIS — L89151 Pressure ulcer of sacral region, stage 1: Secondary | ICD-10-CM

## 2022-11-20 DIAGNOSIS — L89153 Pressure ulcer of sacral region, stage 3: Secondary | ICD-10-CM

## 2022-11-20 DIAGNOSIS — G6181 Chronic inflammatory demyelinating polyneuritis: Secondary | ICD-10-CM

## 2022-11-20 DIAGNOSIS — C61 Malignant neoplasm of prostate: Secondary | ICD-10-CM

## 2022-11-20 DIAGNOSIS — Z515 Encounter for palliative care: Secondary | ICD-10-CM | POA: Diagnosis not present

## 2022-11-20 DIAGNOSIS — U099 Post covid-19 condition, unspecified: Secondary | ICD-10-CM

## 2022-11-20 DIAGNOSIS — G301 Alzheimer's disease with late onset: Secondary | ICD-10-CM | POA: Diagnosis not present

## 2022-11-20 DIAGNOSIS — L89322 Pressure ulcer of left buttock, stage 2: Secondary | ICD-10-CM

## 2022-11-20 DIAGNOSIS — E119 Type 2 diabetes mellitus without complications: Secondary | ICD-10-CM

## 2022-11-20 MED ORDER — LORAZEPAM 0.5 MG PO TABS: 0.5000 mg | ORAL_TABLET | Freq: Three times a day (TID) | ORAL | 0 refills | Status: AC | PRN

## 2022-11-20 MED ORDER — LOSARTAN POTASSIUM 50 MG PO TABS: 50.0000 mg | ORAL_TABLET | Freq: Every day | ORAL | 3 refills | Status: AC

## 2022-11-20 MED ORDER — MORPHINE SULFATE (CONCENTRATE) 20 MG/ML PO SOLN: 5.0000 mg | ORAL | 0 refills | Status: AC | PRN

## 2022-11-20 NOTE — Progress Notes (Signed)
Location:  Other [place] Room Number: [place] of Service:  ALF [phone]) Provider:  Dr. Amada Kingfisher, MD  Patient Care Team: Dewayne Shorter, MD as PCP - General (Family Medicine) Berle Mull, MD as Consulting Physician (Sports Medicine) Armbruster, Carlota Raspberry, MD as Consulting Physician (Gastroenterology) Festus Aloe, MD as Consulting Physician (Urology) Cira Rue, RN Nurse Navigator as Registered Nurse (Medical Oncology) [name], Nanine Means, MD (Neurology) Consuella Lose, MD as Consulting Physician (Neurosurgery)  Extended Emergency Contact Information Primary Emergency Contact: Midge Minium Address: [address]          [place], [place] Phone: [phone] Mobile Phone: [phone] Relation: Spouse  Code Status:  DNR Goals of care: Advanced Directive information    11/20/2022    8:26 AM  Advanced Directives  Does Patient Have a Medical Advance Directive? Yes  Type of Paramedic of [place];Out of facility DNR (pink MOST or yellow form);Living will  Does patient want to make changes to medical advance directive? No - Patient declined  Copy of [place] in Chart? Yes - validated most recent copy scanned in chart (See row information)     Chief Complaint  Patient presents with   Acute Visit    General Decline    HPI:  Pt is a 77 y.o. male seen today for an acute visit for decompensation. Patient has has been eating less, less responsive, and difficult to arouse for the last few days. [name] requested call. Called his wife to discuss concern. She states he has not been eating or interactive. She was curious if it is time to make funeral arrangements and his overall prognosis. Discussed concern that if he doesn't eat it could be a couple of weeks without fluids could be less time.    Past Medical History:  Diagnosis Date    Allergy    Anemia 1996   due to a bleeding from aspirin   Arthritis    Cataract    History of blood transfusion 1996   HOH (hard of hearing)    uses hearing aids/ hearing on left side worse   Hyperlipidemia    Hypertension    Prediabetes 10/20/2014   Prostate cancer [facility])    Sleep apnea    uses c-pap   Spinal stenosis    Past Surgical History:  Procedure Laterality Date   COLONOSCOPY  07/30/2019   COLONOSCOPY WITH PROPOFOL N/A 09/16/2018   Procedure: COLONOSCOPY WITH PROPOFOL;  Surgeon: Yetta Flock, MD;  Location: [facility];  Service: Gastroenterology;  Laterality: N/A;   POLYPECTOMY  09/16/2018   Procedure: POLYPECTOMY;  Surgeon: Yetta Flock, MD;  Location: [facility];  Service: Gastroenterology;;   POLYPECTOMY     SUBMUCOSAL INJECTION  09/16/2018   Procedure: SUBMUCOSAL INJECTION;  Surgeon: Yetta Flock, MD;  Location: [facility];  Service: Gastroenterology;;   TONSILLECTOMY     VASECTOMY      No Known Allergies  Outpatient Encounter Medications as of 11/20/2022  Medication Sig   acetaminophen (TYLENOL) 325 MG tablet Take 650 mg by mouth every 4 (four) hours as needed.   acetaminophen (TYLENOL) 650 MG suppository Place 650 mg rectally every 4 (four) hours as needed.   collagenase (SANTYL) 250 UNIT/GM ointment Apply to sacral wound topically one time daily   finasteride (PROSCAR) 5 MG tablet Take 5 mg by mouth daily.   furosemide (LASIX) 20 MG tablet Take 20  mg by mouth daily as needed.   Probiotic Product (PROBIOTIC PO) Take 1 Dose by mouth daily.   tamsulosin (FLOMAX) 0.4 MG CAPS capsule Take 0.4 mg by mouth 2 (two) times daily.   [DISCONTINUED] amoxicillin-clavulanate (AUGMENTIN) 875-125 MG tablet Take 1 tablet by mouth 2 (two) times daily.   [DISCONTINUED] atorvastatin (LIPITOR) 20 MG tablet TAKE 1 TABLET BY MOUTH ONCE  DAILY   [DISCONTINUED] Calcium 200 MG TABS Give one by mouth once daily.   [DISCONTINUED] Cholecalciferol (VITAMIN  D3) 50 MCG (2000 UT) CAPS Take 1 capsule by mouth daily.   [DISCONTINUED] donepezil (ARICEPT) 10 MG tablet Take 1 tablet (10 mg total) by mouth at bedtime.   [DISCONTINUED] Doxycycline Hyclate 50 MG TABS Take 100 mg by mouth 2 (two) times daily.   [DISCONTINUED] fluticasone (FLONASE) 50 MCG/ACT nasal spray Place 1 spray into both nostrils daily.   [DISCONTINUED] folic acid (FOLVITE) 629 MCG tablet Take 800 mcg by mouth daily.   [DISCONTINUED] losartan (COZAAR) 50 MG tablet TAKE 1 TABLET BY MOUTH  DAILY   [DISCONTINUED] memantine (NAMENDA) 10 MG tablet TAKE 1 TABLET BY MOUTH TWICE  DAILY   [DISCONTINUED] Multiple Vitamin (MULTIVITAMIN WITH MINERALS) TABS tablet Take 1 tablet by mouth daily. Centrum   [DISCONTINUED] SUPER B COMPLEX & C TABS Give one tablet by mouth one time daily   losartan (COZAAR) 50 MG tablet Take 1 tablet (50 mg total) by mouth daily.   [DISCONTINUED] dextromethorphan-guaiFENesin (MUCINEX DM) 30-600 MG 12hr tablet Take 1 tablet by mouth. Every 12 hours for cough for 7 days.   No facility-administered encounter medications on file as of 11/20/2022.    Review of Systems  All other systems reviewed and are negative.   Immunization History  Administered Date(s) Administered   Influenza Split 09/22/2011, 08/25/2014   Influenza Whole 08/23/2007, 07/09/2009, 07/25/2010   Influenza, High Dose Seasonal PF 08/03/2017, 08/05/2018, 08/06/2019, 08/06/2019, 08/05/2020   Influenza-Unspecified 08/17/2015, 09/06/2021   PFIZER(Purple Top)SARS-COV-2 Vaccination 11/25/2019, 12/16/2019, 08/23/2020, 12/15/2020   PNEUMOCOCCAL CONJUGATE-20 02/15/2022   Pneumococcal Conjugate-13 10/20/2014   Pneumococcal Polysaccharide-23 07/03/2008, 08/03/2017   Td 07/23/2009   Tdap 11/03/2019   Zoster Recombinat (Shingrix) 07/07/2020, 09/23/2020   Zoster, Live 03/25/2011   Pertinent  Health Maintenance Due  Topic Date Due   FOOT EXAM  Never done   OPHTHALMOLOGY EXAM  Never done   HEMOGLOBIN A1C   08/17/2022   INFLUENZA VACCINE  02/04/2023 (Originally 06/06/2022)   COLONOSCOPY (Pts 45-79yr Insurance coverage will need to be confirmed)  06/23/2023      10/06/2020    1:08 PM 04/06/2021   10:36 AM 10/06/2021   11:00 AM 12/19/2021    3:11 PM 02/15/2022   10:16 AM  [place]in the past year? 0 1 0 0 1  Was there an injury with Fall? 0 0 0 0 0  Fall Risk Category Calculator 0 2 0 0 1  Fall Risk Category (Retired) Low Moderate Low Low Low  (RETIRED) Patient Fall Risk Level Moderate fall risk Moderate fall risk Low fall risk Low fall risk Moderate fall risk  Fall risk Follow up  Falls evaluation completed Falls evaluation completed Falls prevention discussed Falls evaluation completed   Functional Status Survey:    Vitals:   11/20/22 0819  BP: 137/72  Pulse: (!) 102  Resp: (!) 24  Temp: 98.5 F (36.9 C)  SpO2: 97%  Weight: 216 lb 14.4 oz (98.4 kg)  Height: '5\' 11"'$  (1.803 m)   Body mass index  is 30.25 kg/m. Physical Exam Vitals reviewed.  Cardiovascular:     Rate and Rhythm: Normal rate.     Pulses: Normal pulses.  Pulmonary:     Effort: Pulmonary effort is normal.  Abdominal:     General: Abdomen is flat.     Palpations: Abdomen is soft.  Neurological:     Mental Status: He is alert. He is disoriented.     Labs reviewed: Recent Labs    02/15/22 1059 10/02/22 0000  NA 140 142  K 5.0 4.5  CL 101 104  CO2 32 31*  GLUCOSE 106*  --   BUN 19 25*  CREATININE 0.96 1.1  CALCIUM 9.1 8.8   Recent Labs    02/15/22 1059 10/02/22 0000  AST 22 21  ALT 17 18  ALKPHOS 69 70  BILITOT 0.7  --   PROT 6.8  --   ALBUMIN 4.3 4.0   Recent Labs    02/15/22 1059 10/02/22 0000  WBC 6.9 8.1  NEUTROABS 4.7 5,686.00  HGB 13.6 13.7  HCT 41.3 41  MCV 90.9  --   PLT 239.0 253   Lab Results  Component Value Date   TSH 6.11 (H) 02/15/2022   Lab Results  Component Value Date   HGBA1C 6.3 02/15/2022   Lab Results  Component Value Date   CHOL 145 02/15/2022    HDL 66.60 02/15/2022   LDLCALC 59 02/15/2022   TRIG 95.0 02/15/2022   CHOLHDL 2 02/15/2022    Significant Diagnostic Results in last 30 days:  No results found.  Assessment/Plan Post covid-19 condition, unspecified - Plan: Ambulatory referral to Hospice  Severe late onset Alzheimer's dementia without behavioral disturbance, psychotic disturbance, mood disturbance, or anxiety ([place]) - Plan: Ambulatory referral to Hospice  Prostate cancer [facility])  Chronic inflammatory demyelinating polyneuritis ([place]), Chronic  Diabetes mellitus without complication ([place]), Chronic  Hospice care  Sacral decubitus ulcer, stage III ([place])  Pressure injury of left buttock, stage 2 (H[facility])  Pressure injury of sacral region, stage 1 Patient with recent covid infection. Now with worsening sacral wound that is now 4cmx1.5cm increased from previously . Decreased interaction. Remains somnolent. Unable to work with therapy. Concern for general decline. Discussed concern that these are signs he is not going to recover and should refer to hospice. She was glad to hear this does not require a transfer to higher level of care. Will plan to transition to hospice at this time. Medications discontinued for comfort. Will order comfort medications to be delivered as well.    Family/ staff Communication: wife and nursing  Labs/tests ordered:  none

## 2022-11-22 DIAGNOSIS — L853 Xerosis cutis: Secondary | ICD-10-CM | POA: Diagnosis not present

## 2022-11-22 DIAGNOSIS — L570 Actinic keratosis: Secondary | ICD-10-CM | POA: Diagnosis not present

## 2022-11-22 DIAGNOSIS — L814 Other melanin hyperpigmentation: Secondary | ICD-10-CM | POA: Diagnosis not present

## 2022-11-29 ENCOUNTER — Other Ambulatory Visit: Payer: Self-pay | Admitting: Neurology

## 2022-12-07 DEATH — deceased
